# Patient Record
Sex: Male | Born: 1960 | Race: White | Hispanic: No | Marital: Married | Smoking: Former smoker
Health system: Southern US, Community
[De-identification: ages and names within clinical notes are randomized; demographics above are authoritative.]

## PROBLEM LIST (undated history)

## (undated) DIAGNOSIS — Z87442 Personal history of urinary calculi: Secondary | ICD-10-CM

## (undated) DIAGNOSIS — F429 Obsessive-compulsive disorder, unspecified: Secondary | ICD-10-CM

## (undated) DIAGNOSIS — Z86718 Personal history of other venous thrombosis and embolism: Secondary | ICD-10-CM

## (undated) DIAGNOSIS — I10 Essential (primary) hypertension: Secondary | ICD-10-CM

## (undated) DIAGNOSIS — M1990 Unspecified osteoarthritis, unspecified site: Secondary | ICD-10-CM

## (undated) DIAGNOSIS — F909 Attention-deficit hyperactivity disorder, unspecified type: Secondary | ICD-10-CM

## (undated) HISTORY — DX: Essential (primary) hypertension: I10

## (undated) HISTORY — PX: HERNIA REPAIR: SHX51

## (undated) HISTORY — DX: Attention-deficit hyperactivity disorder, unspecified type: F90.9

## (undated) HISTORY — PX: LITHOTRIPSY: SUR834

## (undated) HISTORY — PX: COLONOSCOPY: SHX174

## (undated) HISTORY — PX: TONSILLECTOMY: SUR1361

## (undated) HISTORY — DX: Obsessive-compulsive disorder, unspecified: F42.9

---

## 2004-04-15 ENCOUNTER — Encounter: Payer: Self-pay | Admitting: Internal Medicine

## 2005-03-24 ENCOUNTER — Ambulatory Visit: Payer: Self-pay | Admitting: Internal Medicine

## 2006-04-06 ENCOUNTER — Ambulatory Visit: Payer: Self-pay | Admitting: Internal Medicine

## 2007-02-21 ENCOUNTER — Ambulatory Visit: Payer: Self-pay | Admitting: Internal Medicine

## 2007-02-21 ENCOUNTER — Ambulatory Visit: Payer: Self-pay | Admitting: Cardiology

## 2007-02-21 LAB — CONVERTED CEMR LAB
Creatinine, Ser: 1 mg/dL (ref 0.4–1.5)
Uric Acid, Serum: 8 mg/dL — ABNORMAL HIGH (ref 2.4–7.0)

## 2007-03-04 ENCOUNTER — Emergency Department (HOSPITAL_COMMUNITY): Admission: EM | Admit: 2007-03-04 | Discharge: 2007-03-04 | Payer: Self-pay | Admitting: Emergency Medicine

## 2007-03-23 ENCOUNTER — Encounter: Admission: RE | Admit: 2007-03-23 | Discharge: 2007-03-23 | Payer: Self-pay | Admitting: Internal Medicine

## 2007-04-19 ENCOUNTER — Ambulatory Visit: Payer: Self-pay | Admitting: Internal Medicine

## 2007-04-19 LAB — CONVERTED CEMR LAB
Magnesium: 2.1 mg/dL (ref 1.5–2.5)
Uric Acid, Serum: 7.4 mg/dL — ABNORMAL HIGH (ref 2.4–7.0)

## 2007-06-16 ENCOUNTER — Telehealth (INDEPENDENT_AMBULATORY_CARE_PROVIDER_SITE_OTHER): Payer: Self-pay | Admitting: *Deleted

## 2007-09-08 ENCOUNTER — Encounter: Payer: Self-pay | Admitting: Internal Medicine

## 2007-10-14 ENCOUNTER — Encounter: Payer: Self-pay | Admitting: Internal Medicine

## 2007-10-14 ENCOUNTER — Ambulatory Visit: Payer: Self-pay | Admitting: Unknown Physician Specialty

## 2007-10-28 ENCOUNTER — Ambulatory Visit: Payer: Self-pay | Admitting: Urology

## 2007-11-18 ENCOUNTER — Telehealth (INDEPENDENT_AMBULATORY_CARE_PROVIDER_SITE_OTHER): Payer: Self-pay | Admitting: *Deleted

## 2007-11-21 DIAGNOSIS — E785 Hyperlipidemia, unspecified: Secondary | ICD-10-CM

## 2007-11-21 DIAGNOSIS — I4949 Other premature depolarization: Secondary | ICD-10-CM

## 2007-11-21 DIAGNOSIS — Z8719 Personal history of other diseases of the digestive system: Secondary | ICD-10-CM

## 2007-11-21 DIAGNOSIS — I803 Phlebitis and thrombophlebitis of lower extremities, unspecified: Secondary | ICD-10-CM

## 2007-11-21 DIAGNOSIS — F429 Obsessive-compulsive disorder, unspecified: Secondary | ICD-10-CM | POA: Insufficient documentation

## 2007-12-12 ENCOUNTER — Telehealth (INDEPENDENT_AMBULATORY_CARE_PROVIDER_SITE_OTHER): Payer: Self-pay | Admitting: *Deleted

## 2007-12-16 ENCOUNTER — Ambulatory Visit: Payer: Self-pay | Admitting: Internal Medicine

## 2007-12-18 LAB — CONVERTED CEMR LAB
ALT: 37 units/L (ref 0–53)
AST: 31 units/L (ref 0–37)
Albumin: 4 g/dL (ref 3.5–5.2)
Alkaline Phosphatase: 49 units/L (ref 39–117)
Bilirubin, Direct: 0.1 mg/dL (ref 0.0–0.3)
Cholesterol: 202 mg/dL (ref 0–200)
Direct LDL: 132.8 mg/dL
HDL: 43.1 mg/dL (ref >39.0)
Total Bilirubin: 1 mg/dL (ref 0.3–1.2)
Total CHOL/HDL Ratio: 4.7
Total Protein: 6.6 g/dL (ref 6.0–8.3)
Triglycerides: 137 mg/dL (ref 0–149)
VLDL: 27 mg/dL (ref 0–40)

## 2007-12-19 ENCOUNTER — Encounter (INDEPENDENT_AMBULATORY_CARE_PROVIDER_SITE_OTHER): Payer: Self-pay | Admitting: *Deleted

## 2007-12-30 ENCOUNTER — Ambulatory Visit: Payer: Self-pay | Admitting: Internal Medicine

## 2007-12-30 ENCOUNTER — Telehealth: Payer: Self-pay | Admitting: Internal Medicine

## 2007-12-30 DIAGNOSIS — E782 Mixed hyperlipidemia: Secondary | ICD-10-CM | POA: Insufficient documentation

## 2007-12-30 LAB — CONVERTED CEMR LAB
Cholesterol, target level: 200 mg/dL
HDL goal, serum: 40 mg/dL
LDL Goal: 160 mg/dL

## 2008-02-18 IMAGING — CT CT HEAD W/O CM
1 series · 16 of 30 positions shown, 20 images · IV contrast (agent unspecified)
Comparison: none

CLINICAL DATA: Acute onset severe right-sided headache.  
HEAD CT WITHOUT CONTRAST:
TECHNIQUE: Contiguous axial images were obtained from the base of the skull through the vertex according to standard protocol without contrast.

[Series 2: head_seq 4.5 h37s st · axial · 0.47mm/px · z∈[-147,-3]mm · 16 of 36 slices shown, 20 images]
[im 2/36  brain]
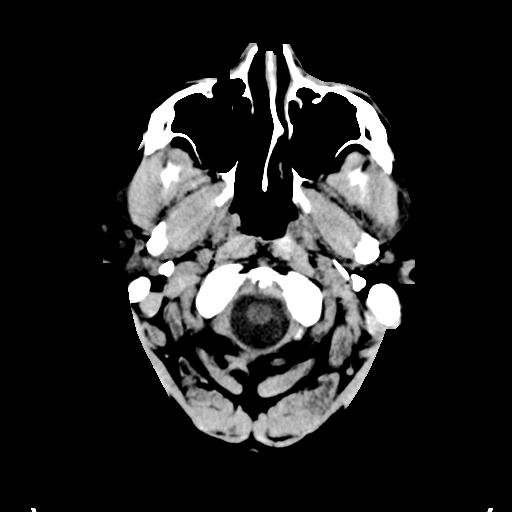
[im 2/36  bone]
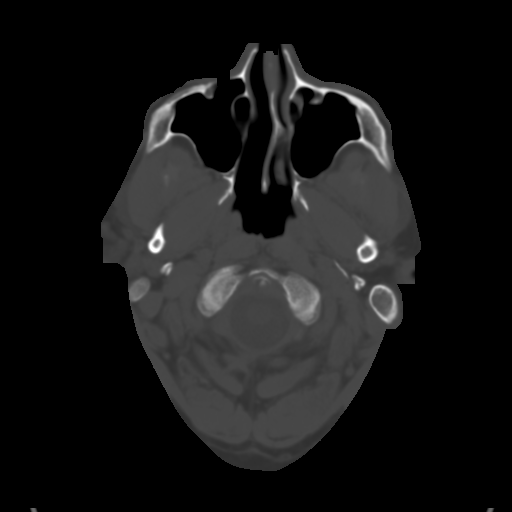
[im 4/36  brain]
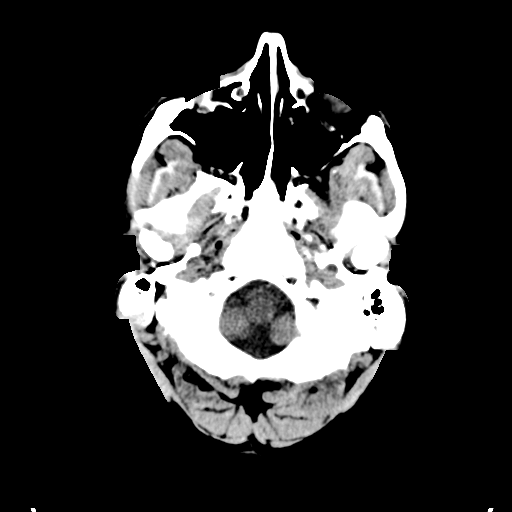
[im 7/36  brain]
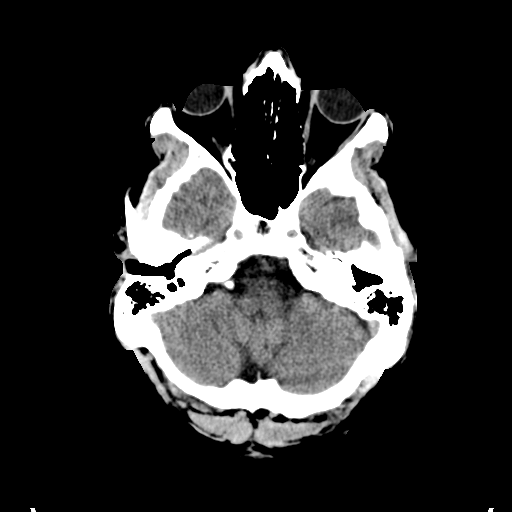
[im 9/36  brain]
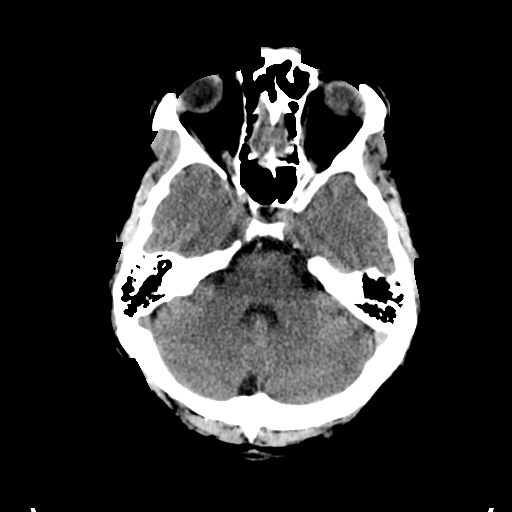
[im 10/36  brain]
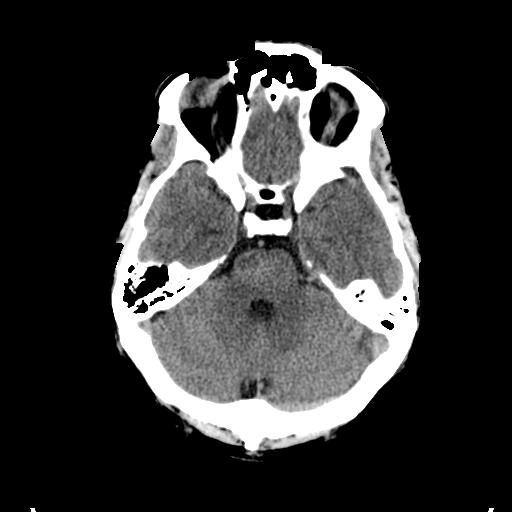
[im 10/36  bone]
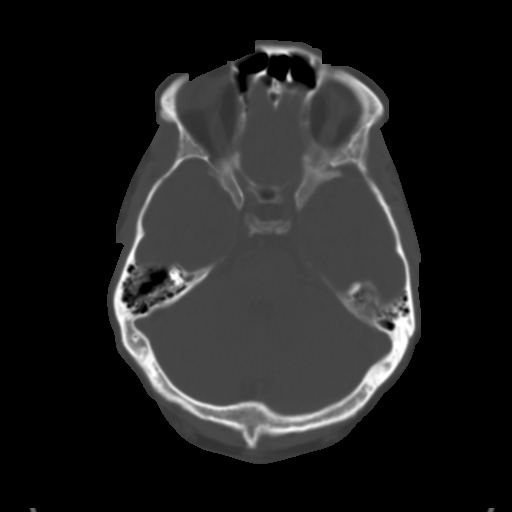
[im 13/36  brain]
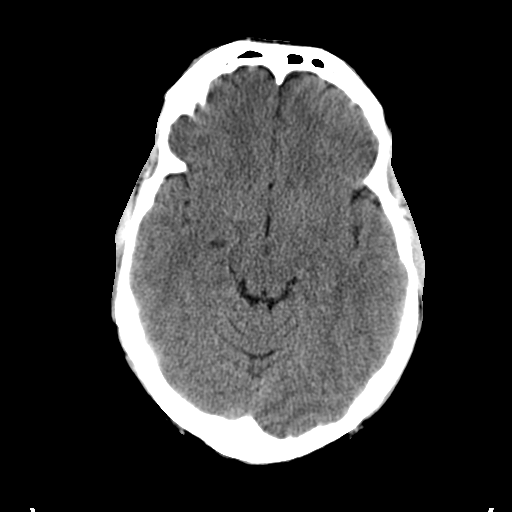
[im 15/36  brain]
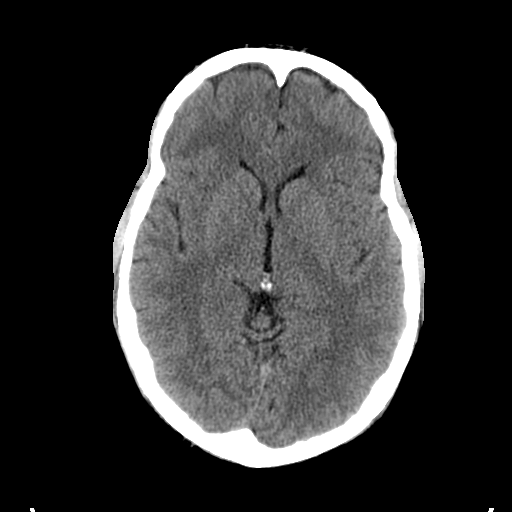
[im 17/36  brain]
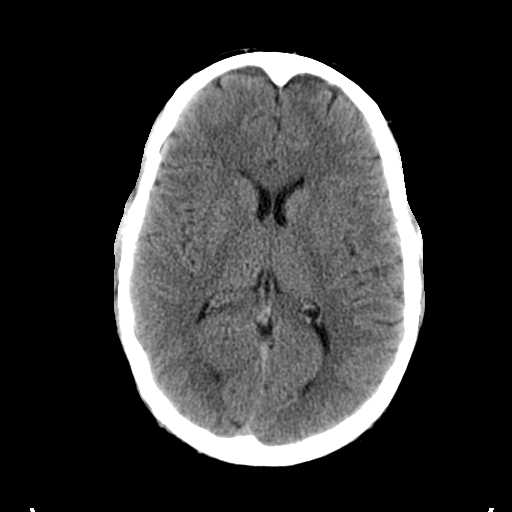
[im 19/36  brain]
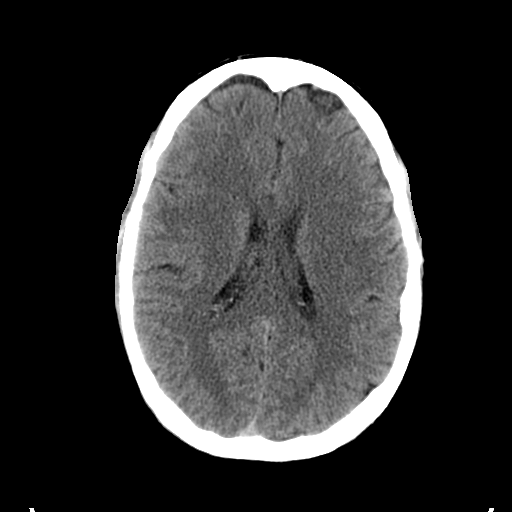
[im 19/36  bone]
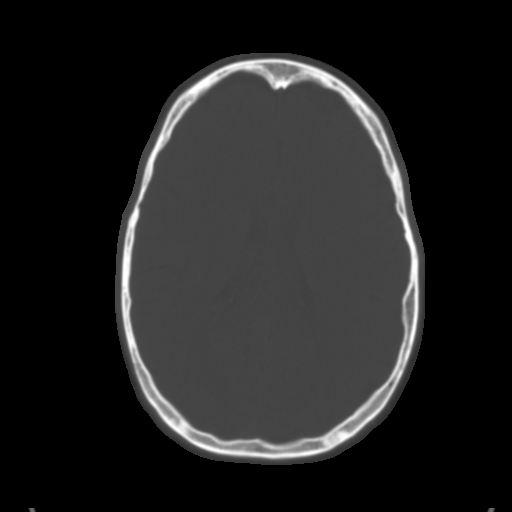
[im 21/36  brain]
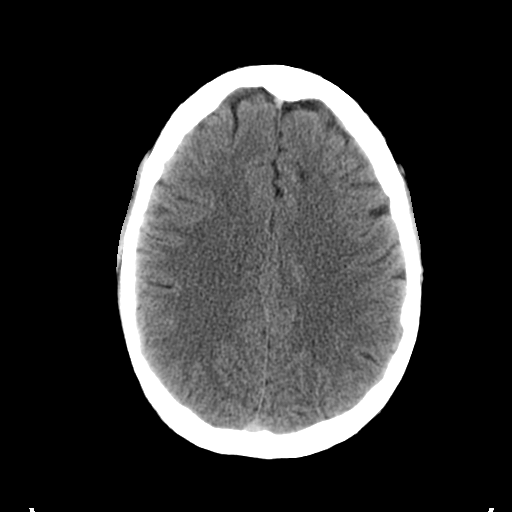
[im 23/36  brain]
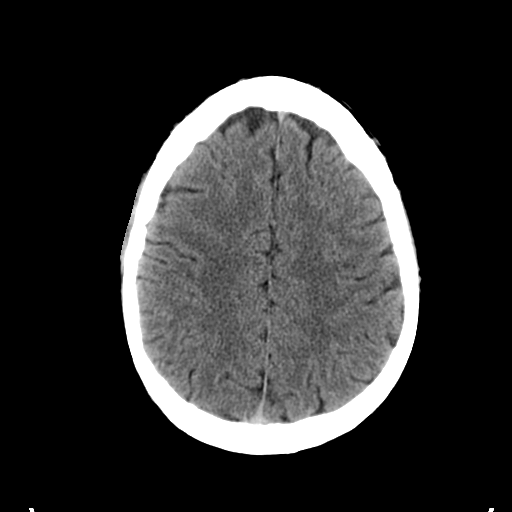
[im 26/36  brain]
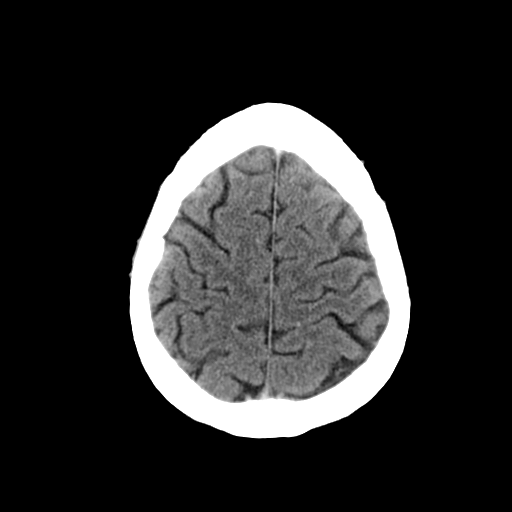
[im 27/36  brain]
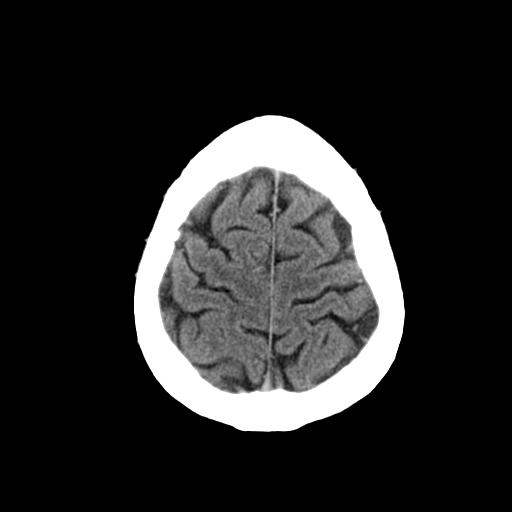
[im 27/36  bone]
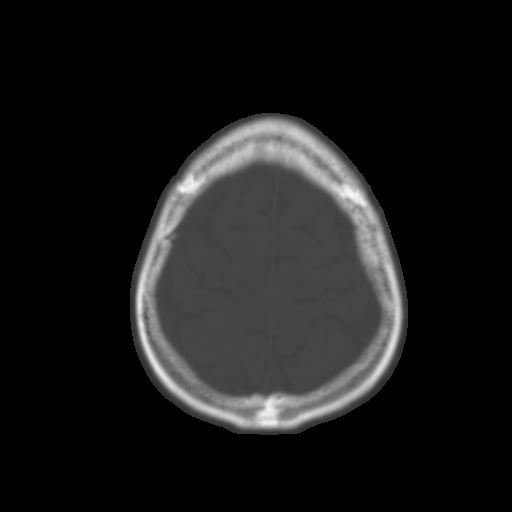
[im 29/36  brain]
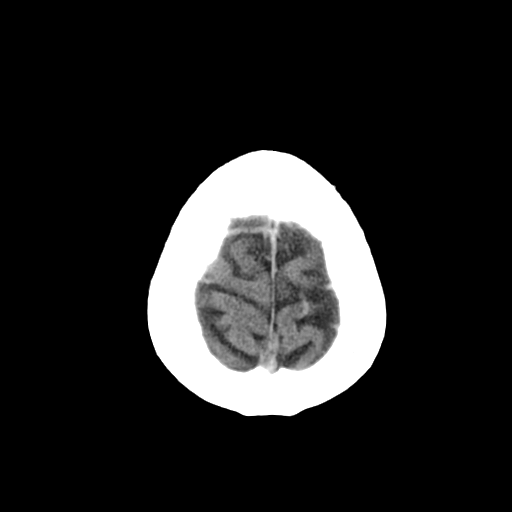
[im 32/36  brain]
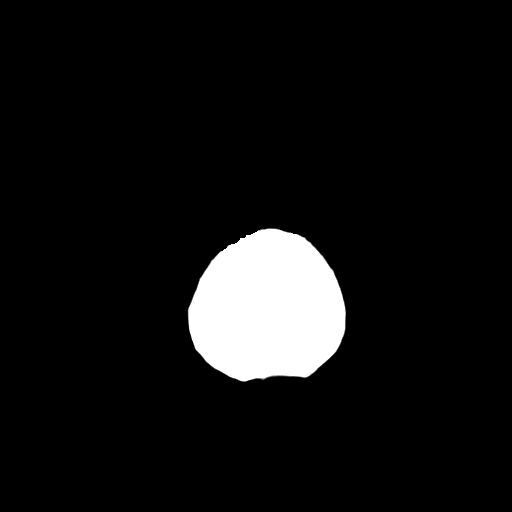
[im 34/36  brain]
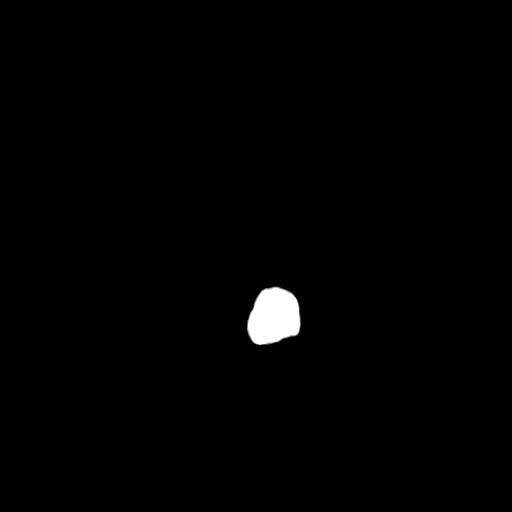

[16 of 30 positions shown; findings below may reference images not displayed]

FINDINGS: There is no evidence of intracranial hemorrhage, brain edema, acute infarct, mass lesion, or mass effect.  No other intra-axial abnormalities are seen, and the ventricles are within normal limits.  No abnormal 
extra-axial fluid collections or masses are identified.  No skull 
abnormalities are noted.
IMPRESSION: Negative non-contrast head CT.

## 2008-03-22 ENCOUNTER — Ambulatory Visit: Payer: Self-pay | Admitting: Internal Medicine

## 2008-03-22 LAB — CONVERTED CEMR LAB
ALT: 41 units/L (ref 0–53)
AST: 33 units/L (ref 0–37)
Albumin: 4.2 g/dL (ref 3.5–5.2)
Alkaline Phosphatase: 52 units/L (ref 39–117)
Bilirubin, Direct: 0.1 mg/dL (ref 0.0–0.3)
Cholesterol: 151 mg/dL (ref 0–200)
HDL: 38.3 mg/dL — ABNORMAL LOW (ref >39.0)
LDL Cholesterol: 87 mg/dL (ref 0–99)
Total Bilirubin: 1.2 mg/dL (ref 0.3–1.2)
Total CHOL/HDL Ratio: 3.9
Total Protein: 6.9 g/dL (ref 6.0–8.3)
Triglycerides: 130 mg/dL (ref 0–149)
VLDL: 26 mg/dL (ref 0–40)

## 2008-03-29 ENCOUNTER — Ambulatory Visit: Payer: Self-pay | Admitting: Internal Medicine

## 2008-03-29 DIAGNOSIS — I10 Essential (primary) hypertension: Secondary | ICD-10-CM

## 2008-03-29 LAB — CONVERTED CEMR LAB: LDL Goal: 130 mg/dL

## 2008-04-24 ENCOUNTER — Telehealth: Payer: Self-pay | Admitting: Internal Medicine

## 2008-04-26 ENCOUNTER — Telehealth (INDEPENDENT_AMBULATORY_CARE_PROVIDER_SITE_OTHER): Payer: Self-pay | Admitting: *Deleted

## 2008-05-15 ENCOUNTER — Encounter: Payer: Self-pay | Admitting: Internal Medicine

## 2008-05-16 ENCOUNTER — Telehealth (INDEPENDENT_AMBULATORY_CARE_PROVIDER_SITE_OTHER): Payer: Self-pay | Admitting: *Deleted

## 2008-05-17 ENCOUNTER — Ambulatory Visit: Payer: Self-pay | Admitting: Urology

## 2008-05-17 ENCOUNTER — Telehealth (INDEPENDENT_AMBULATORY_CARE_PROVIDER_SITE_OTHER): Payer: Self-pay | Admitting: *Deleted

## 2008-07-20 ENCOUNTER — Telehealth (INDEPENDENT_AMBULATORY_CARE_PROVIDER_SITE_OTHER): Payer: Self-pay | Admitting: *Deleted

## 2008-07-26 ENCOUNTER — Ambulatory Visit: Payer: Self-pay | Admitting: Internal Medicine

## 2008-08-07 ENCOUNTER — Ambulatory Visit: Payer: Self-pay | Admitting: Internal Medicine

## 2008-09-03 ENCOUNTER — Ambulatory Visit: Payer: Self-pay | Admitting: Urology

## 2008-09-04 ENCOUNTER — Ambulatory Visit: Payer: Self-pay | Admitting: Urology

## 2008-09-06 ENCOUNTER — Ambulatory Visit: Payer: Self-pay | Admitting: Urology

## 2008-09-10 ENCOUNTER — Ambulatory Visit: Payer: Self-pay | Admitting: Urology

## 2008-09-13 ENCOUNTER — Ambulatory Visit: Payer: Self-pay | Admitting: Urology

## 2008-09-20 ENCOUNTER — Ambulatory Visit: Payer: Self-pay | Admitting: Urology

## 2008-10-11 ENCOUNTER — Telehealth (INDEPENDENT_AMBULATORY_CARE_PROVIDER_SITE_OTHER): Payer: Self-pay | Admitting: *Deleted

## 2008-10-29 ENCOUNTER — Ambulatory Visit: Payer: Self-pay | Admitting: Urology

## 2008-11-19 ENCOUNTER — Ambulatory Visit: Payer: Self-pay | Admitting: Urology

## 2008-11-29 ENCOUNTER — Ambulatory Visit: Payer: Self-pay | Admitting: Urology

## 2008-12-17 ENCOUNTER — Ambulatory Visit: Payer: Self-pay | Admitting: Urology

## 2009-01-07 ENCOUNTER — Telehealth (INDEPENDENT_AMBULATORY_CARE_PROVIDER_SITE_OTHER): Payer: Self-pay | Admitting: *Deleted

## 2009-04-04 ENCOUNTER — Telehealth (INDEPENDENT_AMBULATORY_CARE_PROVIDER_SITE_OTHER): Payer: Self-pay | Admitting: *Deleted

## 2009-04-11 ENCOUNTER — Telehealth (INDEPENDENT_AMBULATORY_CARE_PROVIDER_SITE_OTHER): Payer: Self-pay | Admitting: *Deleted

## 2009-04-18 ENCOUNTER — Ambulatory Visit: Payer: Self-pay | Admitting: Internal Medicine

## 2009-04-18 DIAGNOSIS — F988 Other specified behavioral and emotional disorders with onset usually occurring in childhood and adolescence: Secondary | ICD-10-CM | POA: Insufficient documentation

## 2009-04-18 DIAGNOSIS — Z87442 Personal history of urinary calculi: Secondary | ICD-10-CM

## 2009-04-19 ENCOUNTER — Encounter (INDEPENDENT_AMBULATORY_CARE_PROVIDER_SITE_OTHER): Payer: Self-pay | Admitting: *Deleted

## 2009-06-07 ENCOUNTER — Ambulatory Visit: Payer: Self-pay | Admitting: Urology

## 2009-06-07 ENCOUNTER — Encounter: Payer: Self-pay | Admitting: Internal Medicine

## 2009-10-24 ENCOUNTER — Ambulatory Visit: Payer: Self-pay | Admitting: Internal Medicine

## 2010-04-08 ENCOUNTER — Encounter: Payer: Self-pay | Admitting: Internal Medicine

## 2010-04-10 ENCOUNTER — Ambulatory Visit: Payer: Self-pay | Admitting: Surgery

## 2010-04-10 HISTORY — PX: INGUINAL HERNIA REPAIR: SHX194

## 2010-04-17 ENCOUNTER — Ambulatory Visit: Payer: Self-pay | Admitting: Surgery

## 2010-04-23 ENCOUNTER — Ambulatory Visit: Payer: Self-pay | Admitting: Internal Medicine

## 2010-04-23 DIAGNOSIS — E559 Vitamin D deficiency, unspecified: Secondary | ICD-10-CM | POA: Insufficient documentation

## 2010-04-23 DIAGNOSIS — N400 Enlarged prostate without lower urinary tract symptoms: Secondary | ICD-10-CM

## 2010-05-08 ENCOUNTER — Telehealth (INDEPENDENT_AMBULATORY_CARE_PROVIDER_SITE_OTHER): Payer: Self-pay | Admitting: *Deleted

## 2010-09-04 ENCOUNTER — Ambulatory Visit: Payer: Self-pay | Admitting: Internal Medicine

## 2010-09-05 ENCOUNTER — Encounter: Payer: Self-pay | Admitting: Internal Medicine

## 2010-09-16 ENCOUNTER — Telehealth (INDEPENDENT_AMBULATORY_CARE_PROVIDER_SITE_OTHER): Payer: Self-pay | Admitting: *Deleted

## 2010-09-23 ENCOUNTER — Ambulatory Visit: Payer: Self-pay | Admitting: Internal Medicine

## 2010-10-03 ENCOUNTER — Encounter: Payer: Self-pay | Admitting: Internal Medicine

## 2010-10-03 ENCOUNTER — Ambulatory Visit: Payer: Self-pay | Admitting: Unknown Physician Specialty

## 2010-10-30 ENCOUNTER — Ambulatory Visit: Payer: Self-pay | Admitting: Urology

## 2010-10-30 ENCOUNTER — Encounter: Payer: Self-pay | Admitting: Internal Medicine

## 2010-11-20 ENCOUNTER — Ambulatory Visit: Payer: Self-pay | Admitting: Internal Medicine

## 2010-11-20 DIAGNOSIS — M545 Low back pain: Secondary | ICD-10-CM

## 2010-12-01 ENCOUNTER — Other Ambulatory Visit: Payer: Self-pay

## 2011-01-04 ENCOUNTER — Encounter: Payer: Self-pay | Admitting: Internal Medicine

## 2011-01-11 ENCOUNTER — Ambulatory Visit: Payer: Self-pay | Admitting: Internal Medicine

## 2011-01-11 LAB — CONVERTED CEMR LAB
ALT: 37 units/L (ref 0–53)
AST: 28 units/L (ref 0–37)
Albumin: 4.5 g/dL (ref 3.5–5.2)
Alkaline Phosphatase: 67 units/L (ref 39–117)
BUN: 17 mg/dL (ref 6–23)
Basophils Absolute: 0 10*3/uL (ref 0.0–0.1)
Basophils Relative: 0.3 % (ref 0.0–3.0)
Bilirubin, Direct: 0.1 mg/dL (ref 0.0–0.3)
CO2: 29 meq/L (ref 19–32)
Calcium: 9.4 mg/dL (ref 8.4–10.5)
Chloride: 105 meq/L (ref 96–112)
Cholesterol: 150 mg/dL (ref 0–200)
Creatinine, Ser: 1 mg/dL (ref 0.4–1.5)
Eosinophils Absolute: 0.1 10*3/uL (ref 0.0–0.7)
Eosinophils Relative: 2.4 % (ref 0.0–5.0)
GFR calc non Af Amer: 84.68 mL/min (ref >60)
Glucose, Bld: 85 mg/dL (ref 70–99)
HCT: 46.9 % (ref 39.0–52.0)
HDL: 44.8 mg/dL (ref >39.00)
Hemoglobin: 16.5 g/dL (ref 13.0–17.0)
LDL Cholesterol: 87 mg/dL (ref 0–99)
Lymphocytes Relative: 36.5 % (ref 12.0–46.0)
Lymphs Abs: 2.2 10*3/uL (ref 0.7–4.0)
MCHC: 35.3 g/dL (ref 30.0–36.0)
MCV: 91.2 fL (ref 78.0–100.0)
Monocytes Absolute: 0.4 10*3/uL (ref 0.1–1.0)
Monocytes Relative: 6 % (ref 3.0–12.0)
Neutro Abs: 3.4 10*3/uL (ref 1.4–7.7)
Neutrophils Relative %: 54.8 % (ref 43.0–77.0)
PSA: 0.97 ng/mL (ref 0.10–4.00)
Platelets: 167 10*3/uL (ref 150.0–400.0)
Potassium: 3.6 meq/L (ref 3.5–5.1)
RBC: 5.14 M/uL (ref 4.22–5.81)
RDW: 11.7 % (ref 11.5–14.6)
Sodium: 140 meq/L (ref 135–145)
TSH: 0.81 microintl units/mL (ref 0.35–5.50)
Total Bilirubin: 1 mg/dL (ref 0.3–1.2)
Total CHOL/HDL Ratio: 3
Total Protein: 7.2 g/dL (ref 6.0–8.3)
Triglycerides: 90 mg/dL (ref 0.0–149.0)
VLDL: 18 mg/dL (ref 0.0–40.0)
WBC: 6.1 10*3/uL (ref 4.5–10.5)

## 2011-01-13 NOTE — Assessment & Plan Note (Signed)
Summary: FOR BACK PAIN//PH   Vital Signs:  Patient profile:   50 year old male Weight:      186.2 pounds BMI:     25.34 Temp:     98.8 degrees F oral Pulse rate:   72 / minute Resp:     14 per minute BP sitting:   128 / 86  (left arm) Cuff size:   large  Vitals Entered By: Shonna Chock CMA (November 20, 2010 3:02 PM) CC: Back pain: Pulled muscle on 11/07/2010   CC:  Back pain: Pulled muscle on 11/07/2010.  History of Present Illness: Back Pain      This is a 50 year old man who presents with Back pain  since 11/25.  The pain is located in the left low back.  The pain began gradually and after lifting  a 30#  vise.  The pain radiates to the  L   inguinal area. The pain is made better by  flexion  and NSAID medications. The latter excaerbated colitic symptoms. The  patient denies loss of sensation, fecal incontinence, urinary incontinence, and urinary retention.  Chiropractry X 3  helped tmporarily only.  Current Medications (verified): 1)  Asacol 400 Mg  Tbec (Mesalamine) .... Take 3 Tablets Two Times A Day By Mouth 2)  Vyvanse 60 Mg Caps (Lisdexamfetamine Dimesylate) .Marland Kitchen.. 1 By Mouth Once Daily 3)  Niaspan 1000 Mg  Tbcr (Niacin (Antihyperlipidemic)) .Marland Kitchen.. 1 Q Eve As Directed 4)  Sertraline Hcl 50 Mg  Tabs (Sertraline Hcl) .... 1/2 Tab Qam and 2 in The Evening 5)  Bayer Aspirin 325 Mg  Tabs (Aspirin) .Marland Kitchen.. 1 By Mouth Once Daily  Allergies: 1)  ! Phenergan 2)  ! Aleve (Naproxen Sodium)  Physical Exam  General:  well-nourished,in no acute distress; alert,appropriate and cooperative throughout examination Abdomen:  Bowel sounds positive,abdomen soft and non-tender without masses, organomegaly or hernias noted. Msk:  He lay down & sat up w/o help Extremities:  Normal full range of motion of  LE to 90 degrees. Minor DIP changes Neurologic:  alert & oriented X3, strength normal in all extremities,  heel / toe gait normal, and DTRs symmetrical and normal.   Skin:  Intact without  suspicious lesions or rashes   Impression & Recommendations:  Problem # 1:  LOW BACK PAIN SYNDROME (ICD-724.2) @ L1 level His updated medication list for this problem includes:    Bayer Aspirin 325 Mg Tabs (Aspirin) .Marland Kitchen... 1 by mouth once daily    Carisoprodol 350 Mg Tabs (Carisoprodol) .Marland Kitchen... 1 at bedtime as needed    Tramadol Hcl 50 Mg Tabs (Tramadol hcl)    Celebrex 200 Mg Caps (Celecoxib) .Marland Kitchen... 1 two times a day as needed  Complete Medication List: 1)  Asacol 400 Mg Tbec (Mesalamine) .... Take 3 tablets two times a day by mouth 2)  Vyvanse 60 Mg Caps (Lisdexamfetamine dimesylate) .Marland Kitchen.. 1 by mouth once daily 3)  Niaspan 1000 Mg Tbcr (Niacin (antihyperlipidemic)) .Marland Kitchen.. 1 q eve as directed 4)  Sertraline Hcl 50 Mg Tabs (Sertraline hcl) .... 1/2 tab qam and 2 in the evening 5)  Bayer Aspirin 325 Mg Tabs (Aspirin) .Marland Kitchen.. 1 by mouth once daily 6)  Carisoprodol 350 Mg Tabs (Carisoprodol) .Marland Kitchen.. 1 at bedtime as needed 7)  Tramadol Hcl 50 Mg Tabs (Tramadol hcl) 8)  Celebrex 200 Mg Caps (Celecoxib) .Marland Kitchen.. 1 two times a day as needed  Patient Instructions: 1)  Stretching exercises as discussed. Prescriptions: CELEBREX 200 MG CAPS (CELECOXIB)  1 two times a day as needed  #12 x 0   Entered and Authorized by:   Marga Melnick MD   Signed by:   Marga Melnick MD on 11/20/2010   Method used:   Samples Given   RxID:   [id number] CARISOPRODOL 350 MG TABS (CARISOPRODOL) 1 at bedtime as needed  #21 x 0   Entered and Authorized by:   Marga Melnick MD   Signed by:   Marga Melnick MD on 11/20/2010   Method used:   Faxed to ...       [facility] [address], [facility] (retail)       [address]       [place], [place]  [address]       Ph: [phone]       Fax: [phone]   RxID:   [id number]    Orders Added: 1)  Est. Patient Level III [[address]]

## 2011-01-13 NOTE — Assessment & Plan Note (Signed)
Summary: cpx/cbs   Vital Signs:  Patient profile:   50 year old male Height:      72 inches Weight:      182.4 pounds BMI:     24.83 Temp:     99.1 degrees F oral Pulse rate:   85 / minute Resp:     16 per minute BP sitting:   152 / 90  (left arm) Cuff size:   large  Vitals Entered By: Shonna Chock (Apr 23, 2010 1:20 PM)  Comments REVIEWED MED LIST, PATIENT AGREED DOSE AND INSTRUCTION CORRECT    History of Present Illness: Nicholas Bonilla is here for a physical; he is asymptomatic. He had inguinal herniorrhaphy bilaterally 2 weeks ago.Labs 04/08/2010 from Dr Elliott's office reviewed:all WNL except vitamin D level was 21.5. In 2005  his LDL was 140 with 2269 total & 1350 small dense particles & TG 242.LDL now 75 & TG 70. He has lost > 65# with TLC.  Hypertension History:      He denies headache, chest pain, palpitations, dyspnea with exertion, orthopnea, PND, peripheral edema, visual symptoms, neurologic problems, syncope, and side effects from treatment.  BP has been 120/75 on average based on MD appt readings over past 2 months.  Further comments include: Repeat BP was 130/78.        Positive major cardiovascular risk factors include male age 42 years old or older, hyperlipidemia, hypertension, and family history for ischemic heart disease (males less than 13 years old).  Negative major cardiovascular risk factors include no history of diabetes and non-tobacco-user status.        Further assessment for target organ damage reveals no history of ASHD, stroke/TIA, or peripheral vascular disease.     Allergies: 1)  ! 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, Dr Andee Poles history of colitis Nephrolithiasis, hx of X 4 Hyperlipidemia  Past Surgical History: Tonsillectomy Colitis-hospitalization x 1 Colonscopy 2009  negative, Dr Ma Hillock ; Lithotripsy X2, Dr Lonna Cobb, [place] Inguinal herniorrhaphy bilaterally 04-15-2010  Family History: Father:died of  MI  @ age 74, ?HTN, rheumatic fever Mother: negative Siblings: sister cancer larynx, smoker 2 Maternal uncle:  heart disease, death in 50-60s  Social History: Former Smoker :quit  cigarettes  1986 after 10 yrs Alcohol use-yes rarely Occupation:Sales Associate Married Regular exercise-yes: walking 15-30 min 3X/week w/o symptoms  Review of Systems General:  Denies fatigue and sleep disorder. Eyes:  Denies blurring, double vision, and vision loss-both eyes. ENT:  Denies difficulty swallowing and hoarseness. CV:  Denies leg cramps with exertion. Resp:  Denies cough, shortness of breath, sputum productive, and wheezing. GI:  Denies abdominal pain, bloody stools, change in bowel habits, dark tarry stools, and indigestion; Colonoscopy scheduled 09/2010. GU:  Denies discharge, dysuria, and hematuria. MS:  Denies joint pain, joint redness, joint swelling, low back pain, mid back pain, and thoracic pain. Derm:  Denies changes in nail beds, dryness, hair loss, and rash. Neuro:  Denies numbness, tingling, and weakness. Psych:  Denies anxiety, depression, easily angered, easily tearful, and irritability. Endo:  Denies cold intolerance, excessive hunger, excessive thirst, excessive urination, and heat intolerance. Heme:  Denies abnormal bruising and bleeding. Allergy:  Denies itching eyes and sneezing.  Physical Exam  General:  Thin but well-nourished; alert,appropriate and cooperative throughout examination Head:  Normocephalic and atraumatic without obvious abnormalities. Pattern  alopecia ; moustache Eyes:  No corneal or conjunctival inflammation noted. Perrla. Funduscopic exam benign, without hemorrhages, exudates or papilledema Ears:  External ear exam shows no significant lesions or deformities.  Otoscopic examination reveals clear  canals, tympanic membranes are intact bilaterally without bulging, retraction, inflammation or discharge. Hearing is grossly normal bilaterally. Nose:  External nasal examination shows no deformity or inflammation. Nasal mucosa are pink and moist without lesions or exudates. Mouth:  Oral mucosa and oropharynx without lesions or exudates.  Teeth in good repair. Neck:  No deformities, masses, or tenderness noted. Lungs:  Normal respiratory effort, chest expands symmetrically. Lungs are clear to auscultation, no crackles or wheezes. Heart:  Normal rate and regular rhythm. S1 and S2 normal without gallop, murmur, click, rub .S4 Abdomen:  Bowel sounds positive,abdomen soft and non-tender without masses, organomegaly or hernias noted. Lap op scars well healed Rectal:  No external abnormalities noted. Normal sphincter tone. No rectal masses or tenderness. Genitalia:  Deferred, S/P herniorrhaphy Prostate:  no nodules, no asymmetry, no induration, and 1+ enlarged.   Msk:  No deformity or scoliosis noted of thoracic or lumbar spine.   Pulses:  R and L carotid,radial,dorsalis pedis and posterior tibial pulses are full and equal bilaterally Extremities:  No clubbing, cyanosis, edema, or deformity noted with normal full range of motion of all joints.   Neurologic:  alert & oriented X3 and DTRs symmetrical and normal.   Skin:  Intact without suspicious lesions or rashes Cervical Nodes:  No lymphadenopathy noted Axillary Nodes:  No palpable lymphadenopathy Inguinal Nodes:  No significant adenopathy Psych:  memory intact for recent and remote, normally interactive, good eye contact, not anxious appearing, and not depressed appearing.     Impression & Recommendations:  Problem # 1:  ROUTINE GENERAL MEDICAL EXAM@HEALTH  CARE FACL (ICD-V70.0)  Orders: EKG w/ Interpretation (93000)  Problem # 2:  HYPERTENSION, ESSENTIAL NOS (ICD-401.9)  controlled His updated medication list for this problem includes:     Diltiazem Hcl 120 Mg Tabs (Diltiazem hcl) .Marland Kitchen... 1/2 two times a day if bp averages > 130/85  Orders: EKG w/ Interpretation (93000)  Problem # 3:  HYPERLIPIDEMIA (ICD-272.2) Lipids @ goal The following medications were removed from the medication list:    Crestor 5 Mg Tabs (Rosuvastatin calcium) .Marland Kitchen... 1 qd His updated medication list for this problem includes:    Niaspan 1000 Mg Tbcr (Niacin (antihyperlipidemic)) .Marland Kitchen... 1 q eve as directed  Problem # 4:  HYPERPLASIA PROSTATE UNS W/O UR OBST & OTH LUTS (ICD-600.90)  Problem # 5:  Hx of COLITIS, HX OF (ICD-V12.79) as per Dr Mechele Collin  Problem # 6:  VITAMIN D DEFICIENCY (ICD-268.9)  Complete Medication List: 1)  Asacol 400 Mg Tbec (Mesalamine) .... Take 3 tablets two times a day by mouth 2)  Vyvanse 60 Mg Caps (Lisdexamfetamine dimesylate) .Marland Kitchen.. 1 by mouth once daily 3)  Niaspan 1000 Mg Tbcr (Niacin (antihyperlipidemic)) .Marland Kitchen.. 1 q eve as directed 4)  Sertraline Hcl 50 Mg Tabs (Sertraline hcl) .... 1/2 tab qam and 1 1/2 in the evening 5)  Bayer Aspirin 325 Mg Tabs (Aspirin) .Marland Kitchen.. 1 by mouth once daily 6)  Diltiazem Hcl 120 Mg Tabs (Diltiazem hcl) .... 1/2 two times a day if bp averages > 130/85 7)  Decongestant  .... Prescribed by ent as needed  Hypertension Assessment/Plan:      The patient's hypertensive risk group is category B: At least one risk factor (excluding diabetes) with no target organ damage.  His calculated 10 year risk of coronary heart  disease is 6 %.  Today's blood pressure is 152/90.     Patient Instructions: 1)  Check your Blood Pressure regularly. If it is above: 135/85 ON AVERAGE you should make an appointment. Vitamin D3 1000 International Units once daily . Stop Crestor. Please schedule a follow-up fasting lab  appointment in 4 months for vitamin D level (268.9) & NMR Lipoprofile Lipid Panel (272.4,V17.3).Take an Aspirin every day. Prescriptions: NIASPAN 1000 MG  TBCR (NIACIN (ANTIHYPERLIPIDEMIC)) 1 q eve as directed   #90 x 3   Entered and Authorized by:   Marga Melnick MD   Signed by:   Marga Melnick MD on 04/23/2010   Method used:   Faxed to ...       [facility] (retail)       [address]       [place], [place]  [address]       Ph: [phone]       Fax: [phone]   RxID:   [id number]

## 2011-01-13 NOTE — Progress Notes (Signed)
Summary: appt to discuss nmr  Phone Note Outgoing Call   Call placed by: Doristine Devoid CMA,  September 16, 2010 3:56 PM Call placed to: Patient Summary of Call: left message on machine patient will need to schedule appt w/ [facility] to discuss nmr  Follow-up for Phone Call        pt aware,appt scheduled...........Marland KitchenFelecia Deloach CMA  September 16, 2010 4:01 PM

## 2011-01-13 NOTE — Letter (Signed)
Summary: Cancer Screening/[facility] Personalized Risk Profile  Cancer Screening/[facility] Personalized Risk Profile   Imported By: Lanelle Bal 04/28/2010 12:47:18  _____________________________________________________________________  External Attachment:    Type:   Image     Comment:   External Document

## 2011-01-13 NOTE — Procedures (Signed)
Summary: Colonoscopy/[facility]  [facility]   Imported By: Lanelle Bal 10/28/2010 09:21:44  _____________________________________________________________________  External Attachment:    Type:   Image     Comment:   External Document

## 2011-01-13 NOTE — Assessment & Plan Note (Signed)
Summary: TO DISCUSS LABS///SPH   Vital Signs:  Patient profile:   50 year old male Weight:      183.8 pounds BMI:     25.02 Pulse rate:   76 / minute Resp:     12 per minute BP sitting:   122 / 80  (left arm) Cuff size:   large  Vitals Entered By: Shonna Chock CMA (September 23, 2010 4:17 PM) CC: Follow-up visit: Discuss Labs (patient with mailed copy), Lipid Management   CC:  Follow-up visit: Discuss Labs (patient with mailed copy) and Lipid Management.  History of Present Illness: Hyperlipidemia Follow-Up      This is a 50 year old man who presents for Hyperlipidemia follow-up.  The patient reports flushing, but denies muscle aches, GI upset, abdominal pain, itching, constipation, diarrhea, and fatigue.  The patient denies the following symptoms: chest pain/pressure, exercise intolerance, dypsnea, palpitations, syncope, and pedal edema.  Compliance with medications (by patient report) has been near 100%.  Dietary compliance has been good.  The patient reports exercising 2-3X/ week.  Adjunctive measures currently used by the patient include ASA and niacin.   NMR Lipoprofile reviewed:LDL goal = < 120.  Lipid Management History:      Positive NCEP/ATP III risk factors include male age 67 years old or older, family history for ischemic heart disease (males less than 47 years old), and hypertension.  Negative NCEP/ATP III risk factors include non-diabetic, non-tobacco-user status, no ASHD (atherosclerotic heart disease), no prior stroke/TIA, no peripheral vascular disease, and no history of aortic aneurysm.     Current Medications (verified): 1)  Asacol 400 Mg  Tbec (Mesalamine) .... Take 3 Tablets Two Times A Day By Mouth 2)  Vyvanse 60 Mg Caps (Lisdexamfetamine Dimesylate) .Marland Kitchen.. 1 By Mouth Once Daily 3)  Niaspan 1000 Mg  Tbcr (Niacin (Antihyperlipidemic)) .Marland Kitchen.. 1 Q Eve As Directed 4)  Sertraline Hcl 50 Mg  Tabs (Sertraline Hcl) .... 1/2 Tab Qam and 1 1/2 in The Evening 5)  Bayer Aspirin  325 Mg  Tabs (Aspirin) .Marland Kitchen.. 1 By Mouth Once Daily  Allergies: 1)  ! 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 NMR Lipoprofile panel 2011: LDL 143(1680/593), HDL 66, TG 80.LDL goal = < 120.  Family History: Father:died of  MI  @ age 80, ?HTN, rheumatic fever Mother: negative Siblings: sister cancer larynx, smoker 2 Maternal uncles:  heart disease, death in 50-60s  Physical Exam  General:  well-nourished; alert,appropriate and cooperative throughout examination Heart:  Normal rate and regular rhythm. S1 and S2 normal without gallop, murmur, click, rub.S4 Pulses:  R and L carotid,radial,dorsalis pedis and posterior tibial pulses are full and equal bilaterally Psych:  Oriented X3.  Focused & intelligent   Impression & Recommendations:  Problem # 1:  HYPERLIPIDEMIA (ICD-272.2)  His updated medication list for this problem includes:    Niaspan 1000 Mg Tbcr (Niacin (antihyperlipidemic)) .Marland Kitchen... 1 q eve as directed  Complete Medication List: 1)  Asacol 400 Mg Tbec (Mesalamine) .... Take 3 tablets two times a day by mouth 2)  Vyvanse 60 Mg Caps (Lisdexamfetamine dimesylate) .Marland Kitchen.. 1 by mouth once daily 3)  Niaspan 1000 Mg Tbcr (Niacin (antihyperlipidemic)) .Marland Kitchen.. 1 q eve as directed 4)  Sertraline Hcl 50 Mg Tabs (Sertraline  hcl) .... 1/2 tab qam and 1 1/2 in the evening 5)  Bayer Aspirin 325 Mg Tabs (Aspirin) .Marland Kitchen.. 1 by mouth once daily  Lipid Assessment/Plan:      Based on NCEP/ATP III, the patient's risk factor category is "2 or more risk factors and a calculated 10 year CAD risk of < 20%".  The patient's lipid goals are as follows: Total cholesterol goal is 200; LDL cholesterol goal is 130; HDL cholesterol goal is  40; Triglyceride goal is 150.  His LDL cholesterol goal has been met.  Secondary causes for hyperlipidemia have been ruled out.  He has been counseled on adjunctive measures for lowering his cholesterol and has been provided with dietary instructions.    Patient Instructions: 1)  TLC X 6 months , then check a fasting [place] Heart Panel (1304 X)

## 2011-01-13 NOTE — Letter (Signed)
Summary: [facility]  [facility]   Imported By: Lanelle Bal 11/10/2010 13:52:21  _____________________________________________________________________  External Attachment:    Type:   Image     Comment:   External Document

## 2011-01-13 NOTE — Progress Notes (Signed)
  Phone Note Other Incoming   Request: Send information Summary of Call: [facility] medical release received from the patient requesting for copies of records from 2000-2003. Request forwarded to [employer].

## 2011-01-13 NOTE — Progress Notes (Signed)
Summary: NIASPAN--SHOULD HE GET REFILL  Phone Note Refill Request Call back at Home Phone [phone] Message from:  Patient on September 16, 2010 4:04 PM  Refills Requested: Medication #1:  NIASPAN 1000 MG  TBCR 1 q eve as directed BASED ON LAB RESULTS, SHOULD HE GET THIS REFILLED NOW??  PLEASE CALL HIM TO LET HIM KNOW    [facility]  Initial call taken by: Jerolyn Shin,  September 16, 2010 4:05 PM  Follow-up for Phone Call        see OV dated 04-23-10 rx was sent in to pharmacy #[id number]...............Marland KitchenFelecia Deloach CMA  September 16, 2010 4:14 PM   left pt detial message of the above in reference to refill.............Marland KitchenFelecia Deloach CMA  September 17, 2010 8:13 AM

## 2011-01-13 NOTE — Letter (Signed)
Summary: [facility]  [facility]   Imported By: Lanelle Bal 04/28/2010 13:27:38  _____________________________________________________________________  External Attachment:    Type:   Image     Comment:   External Document

## 2011-02-24 ENCOUNTER — Telehealth (INDEPENDENT_AMBULATORY_CARE_PROVIDER_SITE_OTHER): Payer: Self-pay | Admitting: *Deleted

## 2011-02-26 ENCOUNTER — Other Ambulatory Visit: Payer: Self-pay | Admitting: Unknown Physician Specialty

## 2011-03-03 NOTE — Progress Notes (Signed)
Summary: d/c'd niaspan  Phone Note Call from Patient Call back at Home Phone [phone]   Caller: Patient Summary of Call: Pt called says that he believes niaspan is causing flare up of colitis and stopped medication on sunday along w/ aspirin. Just wanted to know if labs should be done sooner has pending appointment 03/2011 Initial call taken by: Doristine Devoid CMA,  February 24, 2011 10:05 AM  Follow-up for Phone Call        spoke w/ patient informed that no need to come in any earlier for appt since his appt is in a few weeks     New/Updated Medications: NIASPAN 1000 MG  TBCR (NIACIN (ANTIHYPERLIPIDEMIC)) stopped  Appended Document: d/c'd niaspan Office Message from Date: 02/23/2011 12:00:00 AM Time of Call: [phone] Faxed To: [facility] [place] [facility] Fax Number: [phone] Facility: home Patient: Nicholas, Bonilla DOB: [birth year] Phone: [phone] Provider: Marga Melnick Message: Needs a call back concerning his medications. Regarding Appointment: Appt Date: Appt Time: Unknown Provider: Reason: Details: Outcome: Message Taken by: Delrae Rend, CSR [facility]  [address] [place], [place] [address]  P: [phone]  F: [phone]

## 2011-03-18 ENCOUNTER — Other Ambulatory Visit: Payer: PRIVATE HEALTH INSURANCE

## 2011-03-25 ENCOUNTER — Other Ambulatory Visit: Payer: Self-pay

## 2011-03-26 ENCOUNTER — Other Ambulatory Visit: Payer: Self-pay

## 2011-03-30 ENCOUNTER — Ambulatory Visit: Payer: Self-pay | Admitting: Internal Medicine

## 2011-04-09 ENCOUNTER — Encounter: Payer: Self-pay | Admitting: Internal Medicine

## 2011-04-21 ENCOUNTER — Other Ambulatory Visit: Payer: Self-pay | Admitting: Unknown Physician Specialty

## 2011-05-01 ENCOUNTER — Ambulatory Visit: Payer: Self-pay | Admitting: Unknown Physician Specialty

## 2011-05-01 NOTE — Assessment & Plan Note (Signed)
Nicholas Bonilla                        [facility] OFFICE NOTE   Nicholas Bonilla, Nicholas Bonilla                      MRN:          [id number]  DATE:04/19/2007                            DOB:          04/07/1961    Nicholas Bonilla was seen for a comprehensive physical examination Apr 19, 2007.   He had recently been evaluated for postcoital headaches with a CAT scan  and MRIs which were negative.  Subsequently he saw an otolaryngologist  who found no pathology.  The symptoms have not reoccurred.   He has been evaluated by Dr. Shelva Majestic recently with a stress echo which  was negative.  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   He has had extensive labs performed which were at goal except for the  lipid profile.  His LDL was 131, triglycerides 172.  This is on Advicor  1000/20.  He is on no specific diet.  He is exercising 20 minutes  several times a week.  He does have exertional dyspnea going up an  incline.   PAST MEDICAL HISTORY:  Is otherwise unchanged.  He has had  tonsillectomy.  He was hospitalized for colitis on 1 occasion.  He had  phlebitis in the left lower extremity in 1996 with no definite triggers.   His father had a heart attack at 65,  hypertension and rheumatic fever.  Maternal uncle had heart disease.  A sister had cancer of the larynx;  she was a smoker.   He has not smoked since 1986.  He drinks minimally.   He has no known drug allergies.   Presently he is on:  1. Asacol 400 mg 3 twice a day.  2. Testosterone supplementation, by Dr. Jamelle Rushing, a urologist in      [place].  Dr. Jamelle Rushing monitors the digital rectal exam and PSA.  3. He is on multiple  supplements  4. Diovan 80 mg daily.  5. Concerta  36 mg daily.  6. Oral chelation formula.  7. Nasonex a needed.   REVIEW OF SYSTEMS:  Reveals some congestion which tends to be seasonal;  as stated he is on Nasonex as needed. He does have a nasal lavage  system.   He will awaken in  the mornings with calf pain, but has no other myalgias  or arthralgias.   The remainder of the review of systems is negative.   He is on no specific diet but is reducing sugars.   REVIEW OF THE CHART:  Indicates that he has dropped his triglycerides  significantly.  Additionally, he has been found to have an elevated  homocysteine level; he is not on vitamin E.   Weight is down approximately 2 pounds at 227.4.  Pulse is 56.  Respiratory rate is 12 and blood pressure 110/74.  He has a Oceanographer.  There are minimal vascular changes on fundal exam.  Dental  hygiene is  excellent.  There is mild erythema of the nares.  The ENT exam is  otherwise negative.  Thyroid was normal to palpation.  He has no lymphadenopathy at the neck  or axilla.  S4 is present without murmurs.  All pulses are intact.  He has no lymphadenopathy of the neck or axilla.  He has no abdominal tenderness, no masses.  GENITOURINARY:  Exam is deferred to Dr. Jamelle Rushing.  MUSCULOSKELETAL:  Exam is unremarkable as is the neuropsychiatric  evaluation.   LABS:  Performed by Dr. Shelva Majestic were reviewed.  Will draw  a vit D  ,magnesium to evaluate muscle  pain.   I would recommend that [name] follow the Flat Belly Diet as found at  [employer]vention.[employer], which is a low carb, heart healthy diet.  Cardiovascular  exercises should be increased to 30 to 45 minutes 3 or 4 times a week.  The Advicor was to be changed to Simcor titrating dose to 1000/20; but  he requested diet & exercise intervention & staying on Advicor.His LDL  goal is at least less than 100, if not less than 75 ideally.     Nicholas Bonilla. Alwyn Ren, MD,FACP,FCCP  Electronically Signed    [facility]  DD: 04/19/2007  DT: 04/19/2007  Job #: [id number]   cc:   Nicholas Bonilla. Shelva Majestic, M.D.

## 2011-05-01 NOTE — Assessment & Plan Note (Signed)
[facility]                        [place] OFFICE NOTE   ONDRE, SALVETTI                      MRN:          [id number]  DATE:02/21/2007                            DOB:          1961/09/07    Gunnar Fusi. Jaber was seen February 21, 2007, for followup of headache.   The evening of March 7, approximately 6 p.m. while engaging in  intercourse, he noted a painful shot in his head.  He had a dull  headache which persisted over the right temple and crown through March  8.  The symptoms resolved after that.  He has a residual tightness in  his shoulders and also as noted a vague symptom with movement of the  head in that same right temple area.   He denies any ophthalmologic symptoms.  He has had no constitutional  symptoms.  He denies weakness, tremor, gait dysfunction, or numbness or  tingling.   Past history includes tonsillectomy, hospitalization for colitis and  phlebitis in the left lower extremity.  Also had dyslipidemia with  decreased HDL.  He does have an elevated homocysteine level.   His family history includes myocardial infarction, hypertension,  rheumatic heart disease in his father, and heart disease in a maternal  uncle.  Sister had cancer of the larynx.   He quit smoking in 1986.  He drinks minimally.   Presently, he is on:  1. Asacol.  2. Testosterone replacement.  3. Aspirin 325.  4. Celexa 40.  5. Diovan 80.  6. Concerta 36.  7. Advicor 1000/20.  8. Oral chelation substance.   He has no known drug allergies.   Weight was up 9 pounds to 229, pulse 64, respiratory rate 16, and blood  pressure 122/66.  Pupils equal, round, and reactive to light.  Visual  fields are normal.  Extraocular motion is intact.   Cranial nerve exam normal, as are reflexes.   There were no carotid bruits.  Deep tendon reflexes are normal.   He has an S4 with no murmurs.  Carotids reveal no bruits.   Neurologic exam reveals normal gait  and station.  He has normal strength  and sensation.  Rapid alternating movement, alliteration, and Romberg  were all negative.   Because he describes this as one of the worst headaches that he has ever  had, and because of the medications he is taking including Concerta, I  will recommend imaging despite the negative neurologic and  cardiovascular exam.     Titus Dubin. Alwyn Ren, MD,FACP,FCCP  Electronically Signed    [facility]  DD: 02/21/2007  DT: 02/21/2007  Job #: [id number]

## 2011-05-06 ENCOUNTER — Ambulatory Visit (INDEPENDENT_AMBULATORY_CARE_PROVIDER_SITE_OTHER): Payer: PRIVATE HEALTH INSURANCE | Admitting: Internal Medicine

## 2011-05-06 ENCOUNTER — Encounter: Payer: Self-pay | Admitting: Internal Medicine

## 2011-05-06 DIAGNOSIS — Z8249 Family history of ischemic heart disease and other diseases of the circulatory system: Secondary | ICD-10-CM

## 2011-05-06 DIAGNOSIS — E782 Mixed hyperlipidemia: Secondary | ICD-10-CM

## 2011-05-06 DIAGNOSIS — I10 Essential (primary) hypertension: Secondary | ICD-10-CM

## 2011-05-06 MED ORDER — METOPROLOL TARTRATE 25 MG PO TABS: 25.0000 mg | ORAL_TABLET | Freq: Two times a day (BID) | ORAL | Status: DC

## 2011-05-06 MED ORDER — SIMVASTATIN 20 MG PO TABS: 20.0000 mg | ORAL_TABLET | Freq: Every evening | ORAL | Status: DC

## 2011-05-06 NOTE — Assessment & Plan Note (Addendum)
NMR Lipoprofile 2005: LDL 140(2269/1350),TG 242. LDL goal =< 100. Father MI @ 75

## 2011-05-06 NOTE — Patient Instructions (Signed)
If you do start the simvastatin 20 mg at bedtime; please have a fasting in NMR lipoprotein  & fasting AST/ALT after 10 weeks of therapy while still on the medicine.9272.4, 995.2)

## 2011-05-06 NOTE — Progress Notes (Signed)
  Subjective:    Patient ID: Nicholas Bonilla, male    DOB: 12-22-1960, 50 y.o.   MRN: [id number]  HPI #1  HYPERTENSION exacerbation after  Colonoscopy 05/18 ; reading was 164/106. He is concerned Prednisone 40 mg daily x 4 weeks played a role. Off BP meds  for > 18 months. Disease Monitoring  Blood pressure range: since 5/18 average 150/90  Chest pain: no   Dyspnea: no   Claudication: no  Medication compliance: yes,   Medication Side Effects  Lightheadedness: no   Urinary frequency: no   Edema: no  Preventitive Healthcare:  Exercise: no, due to colitis   Diet Pattern: no specific diet  Salt Restriction: yes   #2Dyslipidemia assessment:[place] Heart Panel  Lab results  reviewed : his risk relates to LDL > 100 .   Prior Advanced Lipid Testing: NMR Lipoprofile 2005.   Family history of premature CAD/ MI: father @ 89 .  Diabetes : A1c 5.3 % .  Smoking history  : quit 1990 .     Weight :   Up  20# with steroids. ROS: fatigue: no ;  palpitations: no; abd pain/bowel changes: diarrhea & small stools with colitis ; myalgias:no;  syncope : no ; memory loss: no;skin changes: acne from steroids.   Review of Systems he's had swelling of the MCP joints and right hand related to recurrent trauma. He was questioning possibly wearing a  brace of some protective gear     Objective:   Physical Exam General appearance is one of good health and nourishment. Skull is normocephalic without lymphadenopathy about the head, neck, or axilla. See current vital signs (BP 155/88) Heart:  Normal rate and regular rhythm. S1 and S2 normal without gallop, murmur, click, rub or other extra sounds.Lungs:Chest clear to auscultation; no wheezes, rhonchi,rales ,or rubs present.No increased work of breathing.  Abdomen: bowel sounds normal, soft and non-tender without masses, organomegaly or hernias noted.  No guarding or rebound  All pulses intact without  bruits .No ischemic skin changes.  There is mild resolving ecchymosis and  slight fusiform changes the MCP joint of the third finger. Range of motion is normal.       Assessment & Plan:  #1 hypertension uncontrolled, probably multifactorial related to pain with colitis and medications used to treat it  #2 dyslipidemia in the context of premature coronary artery disease in his father, MI 62  #3 post trauma joint changes  Plan: #1 metoprolol 25 mg twice a day with blood pressure monitor  #2 a statin would be recommended to treat the significant elevation of the LDL. His T/T  genotype is normal for statin  metabolization  #3 Zostrix cream twice a day as needed. He will  have to make a concerted effort to prevent recurrent trauma to this area.

## 2011-05-12 LAB — PATHOLOGY REPORT

## 2011-06-22 ENCOUNTER — Encounter: Payer: Self-pay | Admitting: Internal Medicine

## 2011-06-22 ENCOUNTER — Ambulatory Visit (INDEPENDENT_AMBULATORY_CARE_PROVIDER_SITE_OTHER): Payer: PRIVATE HEALTH INSURANCE | Admitting: Internal Medicine

## 2011-06-22 VITALS — BP 156/100 | HR 85 | Wt 214.8 lb

## 2011-06-22 DIAGNOSIS — I809 Phlebitis and thrombophlebitis of unspecified site: Secondary | ICD-10-CM

## 2011-06-22 DIAGNOSIS — E782 Mixed hyperlipidemia: Secondary | ICD-10-CM

## 2011-06-22 DIAGNOSIS — I10 Essential (primary) hypertension: Secondary | ICD-10-CM

## 2011-06-22 DIAGNOSIS — I803 Phlebitis and thrombophlebitis of lower extremities, unspecified: Secondary | ICD-10-CM

## 2011-06-22 DIAGNOSIS — M25519 Pain in unspecified shoulder: Secondary | ICD-10-CM | POA: Insufficient documentation

## 2011-06-22 NOTE — Progress Notes (Signed)
  Subjective:    Patient ID: Nicholas Bonilla, male    DOB: 01-17-1961, 50 y.o.   MRN: [id number]  HPI Here with several issues 3 weeks ago developed a swelling/warmness/redness/ tenderness and aching at inner aspect of the left calf, area of pain was 1x15 cm; overall it is improving. 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 In June he did some remodeling at home and developed a right shoulder pain, located at the a.c. joint. Worse with certain arm movements, worse at night. I notice his BP was elevated, he took metoprolol as prescribed by his primary doctor a few weeks ago but the medication made him "grogy" so he quit taking it Finally, he has developed acne mostly in the back and mid chest, he thinks it was related to the steroids prescribed for ulcerative colitis. He is now off steroids and the acne is going away.  PMH-- reviewed  PSH--reviewed    Review of Systems No recent fever or chills No recent  airplane trip, chest pain or shortness of breath He was prescribed cholesterol medication but decided not to take it for now. No pain of the left shoulder.     Objective:   Physical Exam  Constitutional: He is oriented to person, place, and time. He appears well-developed and well-nourished. No distress.  Musculoskeletal:       Calves themselves are not tender to palpation but the left calf is around 1.5 cm larger than the right. This is a chronic finding according to the patient ever since he had the DVT in the 90s. Shoulders symmetric, range of motion asymmetric as well. No deformities appear lightly tender to palpation at the a.c. joint.   Neurological: He is alert and oriented to person, place, and time.  Skin: He is not diaphoretic.       He has a palpable cord in the inner aspect of the left calf, area is slightly tender, about 1x12 cm. Skin is slightly red and warm. According to the patient is better than before.  Psychiatric: He has a normal  mood and affect. His behavior is normal. Judgment and thought content normal.          Assessment & Plan:

## 2011-06-22 NOTE — Assessment & Plan Note (Signed)
Refer to sports medicine, he may benefit from further workup versus a local injection

## 2011-06-22 NOTE — Assessment & Plan Note (Signed)
Blood pressure elevated today, see history of present illness, reportedly intolerant to metoprolol. Trial with bystolic. See instructions.

## 2011-06-22 NOTE — Assessment & Plan Note (Addendum)
Seems to have an  acute phlebitis in the left leg, symptoms are self resolving. Recommend a warm compress, increase aspirin 325 mg one twice a day for few days. Give a history of DVT, will check  A ultrasound

## 2011-06-22 NOTE — Assessment & Plan Note (Signed)
Has not started statins, recommend to discuss with PCP

## 2011-06-22 NOTE — Patient Instructions (Signed)
Please see Hopp in 2 weeks to see if bystolic 5mg  1 tableta day is working for you Call if side effects

## 2011-06-23 ENCOUNTER — Other Ambulatory Visit: Payer: Self-pay | Admitting: Internal Medicine

## 2011-06-23 ENCOUNTER — Ambulatory Visit (INDEPENDENT_AMBULATORY_CARE_PROVIDER_SITE_OTHER): Payer: PRIVATE HEALTH INSURANCE | Admitting: *Deleted

## 2011-06-23 ENCOUNTER — Telehealth: Payer: Self-pay | Admitting: Internal Medicine

## 2011-06-23 DIAGNOSIS — I8000 Phlebitis and thrombophlebitis of superficial vessels of unspecified lower extremity: Secondary | ICD-10-CM

## 2011-06-23 DIAGNOSIS — I8310 Varicose veins of unspecified lower extremity with inflammation: Secondary | ICD-10-CM

## 2011-06-23 DIAGNOSIS — I809 Phlebitis and thrombophlebitis of unspecified site: Secondary | ICD-10-CM

## 2011-06-23 NOTE — Telephone Encounter (Signed)
Phone call from vascular lab, no DVT in Nicholas left leg, does have superficial phlebitis. I talked to Nicholas Bonilla, plan  is Nicholas same

## 2011-06-24 ENCOUNTER — Ambulatory Visit: Payer: PRIVATE HEALTH INSURANCE | Admitting: Internal Medicine

## 2011-06-25 ENCOUNTER — Encounter: Payer: Self-pay | Admitting: Internal Medicine

## 2011-06-29 ENCOUNTER — Encounter: Payer: Self-pay | Admitting: Family Medicine

## 2011-06-29 ENCOUNTER — Ambulatory Visit (INDEPENDENT_AMBULATORY_CARE_PROVIDER_SITE_OTHER): Payer: PRIVATE HEALTH INSURANCE | Admitting: Family Medicine

## 2011-06-29 VITALS — BP 161/96 | HR 73

## 2011-06-29 DIAGNOSIS — M25519 Pain in unspecified shoulder: Secondary | ICD-10-CM

## 2011-06-29 NOTE — Progress Notes (Signed)
Subjective:    Patient ID: Nicholas Bonilla, male    DOB: 12/24/1960, 50 y.o.   MRN: [id number]  PCP: Dr. Alwyn Ren  HPI 50 yo M here for right shoulder pain.  Patient denies known injury. States he was working a lot around the house 3-4 weeks ago when anterior portion of right shoulder started hurting. No swelling or bruising. Pain has not been waking him up at night. Taking tylenol with some benefit. No prior right shoulder injuries or surgeries. No numbness or tingling. Not icing or using heat. Does radiate down upper arm. No neck pain. L handed  Past Medical History  Diagnosis Date  . Ulcerative colitis   . ADD (attention deficit disorder with hyperactivity)   . OCD (obsessive compulsive disorder)   . Hypertension     Current Outpatient Prescriptions on File Prior to Visit  Medication Sig Dispense Refill  . aspirin 325 MG tablet Take 325 mg by mouth daily.        . carisoprodol (SOMA) 350 MG tablet Take 350 mg by mouth 4 (four) times daily as needed.        . Cholecalciferol (VITAMIN D) 2000 UNITS CAPS Take by mouth daily.        . fish oil-omega-3 fatty acids 1000 MG capsule Take 1 g by mouth 2 (two) times daily.        Marland Kitchen lisdexamfetamine (VYVANSE) 60 MG capsule Take 60 mg by mouth every morning.        . Mesalamine (ASACOL HD) 800 MG TBEC Take by mouth. 2 by mouth three times daily       . nebivolol (BYSTOLIC) 5 MG tablet Take 5 mg by mouth daily.        . Probiotic Product (PROBIOTIC PO) Take by mouth. 2 x daily       . sertraline (ZOLOFT) 50 MG tablet Take 50 mg by mouth as directed. 2 by mouth daily      . simvastatin (ZOCOR) 20 MG tablet Take 1 tablet (20 mg total) by mouth every evening.  90 tablet  0    Past Surgical History  Procedure Date  . Tonsillectomy   . Colonoscopy 2009    Neg  . Lithotripsy     x2  . Inguinal hernia repair 04/10/2010    Bilateral    Allergies  Allergen Reactions  . Naproxen Sodium     REACTION: colitis flare  . Promethazine Hcl      REACTION: mental status changes    History   Social History  . Marital Status: Married    Spouse Name: N/A    Number of Children: N/A  . Years of Education: N/A   Occupational History  . Not on file.   Social History Main Topics  . Smoking status: Former Smoker    Types: Cigarettes    Quit date: 12/14/1984  . Smokeless tobacco: Not on file  . Alcohol Use: Yes  . Drug Use: Not on file  . Sexually Active: Not on file   Other Topics Concern  . Not on file   Social History Narrative  . No narrative on file    Family History  Problem Relation Age of Onset  . Heart attack Father 75  . Hypertension Father   . Rheumatic fever Father   . Sudden death Father   . Cancer Sister     Larynx-smoker  . Hypertension Sister   . Heart disease Maternal Uncle     2 M-Uncles  .  Heart attack Paternal Uncle   . Heart attack Paternal Grandfather   . Diabetes Neg Hx   . Hyperlipidemia Neg Hx     BP 161/96  Pulse 73  Review of Systems See HPI above.    Objective:   Physical Exam Gen: NAD R shoulder: No gross deformity, swelling, bruising. TTP biceps tendon.  No AC or other TTP about shoulder. FROM with negative painful arc. Strength 5/5 with empty can, resisted IR and ER.  Minimal pain with empty can and resisted ER Negative hawkins and neers Negative speeds and yergasons Negative crossover. Negative apprehension.     Assessment & Plan:  1. Right shoulder pain - 2/2 biceps tendinopathy with very mild rotator cuff tendinopathy.  Start PT, continue tylenol.  We discussed nsaids but given his h/o ulcerative colitis and that his pain from tendinopathy is mild, would not start these.  Ice, avoid painful activities.  Will reassess in 6 weeks to check on his progression.

## 2011-06-29 NOTE — Assessment & Plan Note (Signed)
2/2 biceps tendinopathy with very mild rotator cuff tendinopathy.  Start PT, continue tylenol.  We discussed nsaids but given his h/o ulcerative colitis and that his pain from tendinopathy is mild, would not start these.  Ice, avoid painful activities.  Will reassess in 6 weeks to check on his progression.

## 2011-06-29 NOTE — Patient Instructions (Signed)
Your exam and history are most consistent with biceps tendinitis (with very mild rotator cuff tendinopathy as well). Physical therapy and home exercises are the most important part of your treatment. Avoid painful activities except with home exercise program or physical therapy Ice area 15 minutes at a time 3-4 times a day Take tylenol as needed. Surgery is an option if you do not improve with conservative care but is rarely needed. Follow up with me in 6 weeks or as needed if you are better.

## 2011-07-06 ENCOUNTER — Ambulatory Visit (INDEPENDENT_AMBULATORY_CARE_PROVIDER_SITE_OTHER): Payer: PRIVATE HEALTH INSURANCE | Admitting: Internal Medicine

## 2011-07-06 DIAGNOSIS — I10 Essential (primary) hypertension: Secondary | ICD-10-CM

## 2011-07-06 DIAGNOSIS — I803 Phlebitis and thrombophlebitis of lower extremities, unspecified: Secondary | ICD-10-CM

## 2011-07-06 MED ORDER — AMOXICILLIN 500 MG PO CAPS: 500.0000 mg | ORAL_CAPSULE | Freq: Three times a day (TID) | ORAL | Status: AC

## 2011-07-06 MED ORDER — CARVEDILOL 12.5 MG PO TABS: 12.5000 mg | ORAL_TABLET | Freq: Two times a day (BID) | ORAL | Status: DC

## 2011-07-06 NOTE — Progress Notes (Signed)
  Subjective:    Patient ID: Nicholas Bonilla, male    DOB: Oct 24, 1961, 50 y.o.   MRN: [id number]  HPI HYPERTENSION Disease Monitoring  Blood pressure range: not monitored regularly  Chest pain: no   Dyspnea: no   Claudication: no   Medication compliance: yes  Medication Side Effects  Lightheadedness: no   Urinary frequency: no   Edema: no  Preventitive Healthcare:  Exercise: no, due to recent phlebitis   Diet Pattern: Weight Watchers'  Salt Restriction: no       Review of Systems the phlebitis has essentially resolved. He denies any significant soreness in the calf now. He specifically denies pleuritic  chest pain or shortness of breath. He also denies fever, or chills. PMH DVT 1994; S/P coumadin for 6 months.     Objective:   Physical Exam Gen.: Healthy and well-nourished in appearance. Alert, appropriate and cooperative throughout exam. Lungs: Normal respiratory effort; chest expands symmetrically. Lungs are clear to auscultation without rales, wheezes, or increased work of breathing. Heart: Normal rate and rhythm. Normal S1 and S2. No gallop, click, or rub. S4 w/o  murmur.                                                                                    Musculoskeletal/extremities: No clubbing, cyanosis, edema, or deformity noted.  Vascular: Carotid, radial artery, dorsalis pedis and  posterior tibial pulses are full and equal. No bruits present.Homan's negative. Neurologic: Alert and oriented x3. Deep tendon reflexes symmetrical and normal.          Skin: Intact without suspicious lesions or rashes. There is a palpable cord along the medial aspect of the right calf. He states it is not tender. There is very faint hyperpigmentation present in this area as well. Psych: Mood and affect are normal. Normally interactive                                                                                         Assessment & Plan:  #1 hypertension, adequate control  #2  superficial phlebitis, improved. I feel is a mild residual component of phlebitis present.  Plan: #1 warm compresses to this area 3 times a day if possible. Continue to wear the support  hose  #2 antibiotic course.

## 2011-07-06 NOTE — Patient Instructions (Signed)
Blood Pressure Goal  Ideally is an AVERAGE < 135/85. This AVERAGE should be calculated from @ least 5-7 BP readings taken @ different times of day on different days of week. You should not respond to isolated BP readings , but rather the AVERAGE for that week  

## 2011-08-18 ENCOUNTER — Telehealth: Payer: Self-pay | Admitting: *Deleted

## 2011-08-18 NOTE — Telephone Encounter (Signed)
Pt c/o depression & fatigue with Carvedilol and has stopped medication. Pt feels like he had better results w/o side effects with Bystolic and is requesting a new Rx.

## 2011-08-18 NOTE — Telephone Encounter (Signed)
Bystolic 5 mg daily #30, Rx5.Blood Pressure Goal  Ideally is an AVERAGE < 135/85. This AVERAGE should be calculated from @ least 5-7 BP readings taken @ different times of day on different days of week. You should not respond to isolated BP readings , but rather the AVERAGE for that week

## 2011-08-19 MED ORDER — NEBIVOLOL HCL 5 MG PO TABS: 5.0000 mg | ORAL_TABLET | Freq: Every day | ORAL | Status: DC

## 2011-08-19 NOTE — Telephone Encounter (Signed)
Discuss with patient, Rx sent. 

## 2011-08-31 ENCOUNTER — Telehealth: Payer: Self-pay | Admitting: Internal Medicine

## 2011-08-31 NOTE — Telephone Encounter (Signed)
Pt c/o increase fatigue and rash under arm since starting bystolic. Pt note the med is controlling BP. Morning BP 140/85 and PM BP 135/80.Please advise

## 2011-08-31 NOTE — Telephone Encounter (Signed)
Most important is the excellent blood pressure control. This drug is not associated with erectile dysfunction or other adversel effects of Beta blockers. I would give this was a while longer to see if the concerns resolve over 7-10 days. If not ; please see me

## 2011-08-31 NOTE — Telephone Encounter (Signed)
Left message to call office

## 2011-09-02 NOTE — Telephone Encounter (Signed)
Left message to call office

## 2011-09-04 NOTE — Telephone Encounter (Signed)
Discuss with patient  

## 2011-09-24 ENCOUNTER — Ambulatory Visit (INDEPENDENT_AMBULATORY_CARE_PROVIDER_SITE_OTHER): Payer: PRIVATE HEALTH INSURANCE | Admitting: Family Medicine

## 2011-09-24 ENCOUNTER — Encounter: Payer: Self-pay | Admitting: Family Medicine

## 2011-09-24 VITALS — BP 125/80 | HR 69 | Ht 72.0 in | Wt 200.0 lb

## 2011-09-24 DIAGNOSIS — M7580 Other shoulder lesions, unspecified shoulder: Secondary | ICD-10-CM

## 2011-09-24 DIAGNOSIS — M719 Bursopathy, unspecified: Secondary | ICD-10-CM

## 2011-09-24 DIAGNOSIS — S43429A Sprain of unspecified rotator cuff capsule, initial encounter: Secondary | ICD-10-CM

## 2011-09-24 DIAGNOSIS — M75101 Unspecified rotator cuff tear or rupture of right shoulder, not specified as traumatic: Secondary | ICD-10-CM

## 2011-09-24 MED ORDER — NITROGLYCERIN 0.2 MG/HR TD PT24: MEDICATED_PATCH | TRANSDERMAL | Status: DC

## 2011-09-24 MED ORDER — TRAMADOL HCL 50 MG PO TABS: 50.0000 mg | ORAL_TABLET | Freq: Two times a day (BID) | ORAL | Status: DC

## 2011-09-28 NOTE — Progress Notes (Signed)
Subjective:    Patient ID: Nicholas Bonilla, male    DOB: 1961/05/04, 49 y.o.   MRN: [id number]  HPI  Nicholas Bonilla is  Pleasant 50 yo male patient complaining of B/l shoulder pain for the last 5 month. He denies any injuries to his shoulder. Gradual onset pain, right side worse than the left side. He saw Dr. Norton Blizzard in July who recommended RTC HEP. He has not been compliance with the exercise. He states that the right shoulder pain is located anterolateral, 5/10 intensity, sharp, on and off, no radiated, wakes him up at night,worse with overhead activities, no numbness no tingling. The left shoulder pain is located anterolateral, 3/10, on and off, not radiated, dull, worse with motion and overhead activities, tylenol help some. No numbness or tingling.  Patient Active Problem List  Diagnoses  . VITAMIN D DEFICIENCY  . HOMOCYSTINEMIA  . HYPERLIPIDEMIA  . OBSESSIVE-COMPULSIVE DISORDER  . ADD  . HYPERTENSION, ESSENTIAL NOS  . PREMATURE VENTRICULAR CONTRACTIONS  . PHLEBITIS, LOWER EXTREMITY  . 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 LOW BACK PAIN SYNDROME  . COLITIS, HX OF  . NEPHROLITHIASIS, HX OF  . Shoulder pain   Current Outpatient Prescriptions on File Prior to Visit  Medication Sig Dispense Refill  . acetaminophen (TYLENOL) 500 MG tablet Take 500 mg by mouth. 2-4 by mouth as needed only       . aspirin 325 MG tablet Take 325 mg by mouth daily.        . carisoprodol (SOMA) 350 MG tablet Take 350 mg by mouth 4 (four) times daily as needed.        . carvedilol (COREG) 12.5 MG tablet Take 1 tablet (12.5 mg total) by mouth 2 (two) times daily. 1/2 pill twice a day  90 tablet  5  . Cholecalciferol (VITAMIN D) 2000 UNITS CAPS Take 5,000 Units by mouth daily.       . fish oil-omega-3 fatty acids 1000 MG capsule Take 1 g by mouth 2 (two) times daily.        Marland Kitchen lisdexamfetamine (VYVANSE) 60 MG capsule Take 60 mg by mouth every morning.        . Mesalamine (ASACOL HD) 800 MG  TBEC Take by mouth. 2 by mouth three times daily       . nebivolol (BYSTOLIC) 5 MG tablet Take 1 tablet (5 mg total) by mouth daily.  30 tablet  5  . Olive Leaf 500 MG CAPS Take by mouth. 1 by mouth three times daily       . Probiotic Product (PROBIOTIC PO) Take by mouth. 2 x daily       . sertraline (ZOLOFT) 50 MG tablet Take 50 mg by mouth as directed. 2 by mouth daily        Allergies  Allergen Reactions  . Naproxen Sodium     REACTION: colitis flare  . Promethazine Hcl     REACTION: mental status changes     Review of Systems  Constitutional: Negative for fever, chills, diaphoresis and fatigue.  Musculoskeletal: Negative for myalgias, back pain, joint swelling and gait problem.  Neurological: Negative for weakness and numbness.       Objective:   Physical Exam  Constitutional: He appears well-developed and well-nourished.       BP 125/80  Pulse 69  Ht 6' (1.829 m)  Wt 200 lb (90.719 kg)  BMI 27.12 kg/m2   Neck: Normal range of motion. Neck  supple.  Pulmonary/Chest: Effort normal.  Musculoskeletal:       Right shoulder with intact skin. No swelling no hematomas. Full range of motion for flexion extension internal, external rotation abduction and adduction. Hawkins test positive for tear cuff impingement . A.c. joint tenderness to palpation with positive crossover test . Empty can test negative for rotator cuff tear produces pain. Speed test negative for biceps tendinoplasty No tenderness at the bicipital groove. Strength 5 over 5 with internal and external rotation . Sensation is intact .    Left shoulder with intact skin. No swelling no hematomas. Full range of motion for flexion extension internal, external rotation abduction and adduction. Hawkins test positive for tear cuff impingement milder compare with right side . A.c. mild joint tenderness to palpation with positive crossover test . Empty can test negative for rotator cuff tear but produces pain. Speed  test negative for biceps tendinoplasty No tenderness at the bicipital groove. Strength 5 over 5 with internal and external rotation . Sensation is intact .    Neurological: He is alert.  Skin: Skin is warm. No rash noted. No erythema. No pallor.  Psychiatric: He has a normal mood and affect.   MSK [place] :  Right shoulder , partial tear of the supraspinatus with hypoechoic ares intra tendon. Biceps tendon split.No fluid. AC with good space. Mild impingement. Left shoulder with intact supraspinatus. Mild bursitis. No tear. Mild impingements. Good space in the [facility] joint.        Assessment & Plan:   1. Rotator cuff tear, right  nitroGLYCERIN (NITRODUR - DOSED IN MG/24 HR) 0.2 mg/hr, traMADol (ULTRAM) 50 MG tablet  2. Rotator cuff tendinitis  nitroGLYCERIN (NITRODUR - DOSED IN MG/24 HR) 0.2 mg/hr, traMADol (ULTRAM) 50 MG tablet   NTG protocol for the Right shoulder. HEP for RTC strengthening. Tramadol for pain. F/U in 4 weeks.

## 2011-09-30 ENCOUNTER — Ambulatory Visit: Payer: PRIVATE HEALTH INSURANCE | Admitting: Family Medicine

## 2011-10-29 ENCOUNTER — Ambulatory Visit (INDEPENDENT_AMBULATORY_CARE_PROVIDER_SITE_OTHER): Payer: PRIVATE HEALTH INSURANCE | Admitting: Family Medicine

## 2011-10-29 VITALS — BP 127/77

## 2011-10-29 DIAGNOSIS — S43429A Sprain of unspecified rotator cuff capsule, initial encounter: Secondary | ICD-10-CM

## 2011-10-29 DIAGNOSIS — M7580 Other shoulder lesions, unspecified shoulder: Secondary | ICD-10-CM | POA: Insufficient documentation

## 2011-10-29 DIAGNOSIS — M67919 Unspecified disorder of synovium and tendon, unspecified shoulder: Secondary | ICD-10-CM

## 2011-10-29 DIAGNOSIS — M75101 Unspecified rotator cuff tear or rupture of right shoulder, not specified as traumatic: Secondary | ICD-10-CM

## 2011-10-29 NOTE — Patient Instructions (Signed)
Continue nitro patch daily Continue shoulder strengthening exercises. Moist heat before doing the exercise.

## 2011-10-29 NOTE — Progress Notes (Signed)
Subjective:    Patient ID: Nicholas Bonilla, male    DOB: 08-23-1961, 50 y.o.   MRN: [id number]  HPI  Nicholas Bonilla is a pleasant 50 years old male patient coming to follow up on his right shoulder pain. He was seen previously for weeks ago and he was started on a nitroglycerin patch in [place] exercised for right rotator cuff tear. He states that he feels a 30-40% better. He still has on a nominal pain on the anterolateral aspect of his right shoulder, dull ache, 3/10 in intensity, no radiated, worse with overhead activities and trying to reach behind his back, improved by rest. He has tolerated the Nitropatch well. No major side effects, headaches at the beginning but they haven't subsided. He states that he has not been able to do his exercises on daily basis. He has been using Tylenol on and off for pain control. No numbness or tingling radiating distally on his right upper extremity   The pain of his left shoulder hasn't improved and he is filling just tightness on the posterior aspect of his left shoulder.   Patient Active Problem List  Diagnoses  . VITAMIN D DEFICIENCY  . HOMOCYSTINEMIA  . HYPERLIPIDEMIA  . OBSESSIVE-COMPULSIVE DISORDER  . ADD  . HYPERTENSION, ESSENTIAL NOS  . PREMATURE VENTRICULAR CONTRACTIONS  . PHLEBITIS, LOWER EXTREMITY  . 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 LOW BACK PAIN SYNDROME  . COLITIS, HX OF  . NEPHROLITHIASIS, HX OF  . Shoulder pain  . Rotator cuff tendonitis   Current Outpatient Prescriptions on File Prior to Visit  Medication Sig Dispense Refill  . acetaminophen (TYLENOL) 500 MG tablet Take 500 mg by mouth. 2-4 by mouth as needed only       . aspirin 325 MG tablet Take 325 mg by mouth daily.        Marland Kitchen aspirin 81 MG tablet Take 81 mg by mouth daily.        . carisoprodol (SOMA) 350 MG tablet Take 350 mg by mouth 4 (four) times daily as needed.        . carvedilol (COREG) 12.5 MG tablet Take 1 tablet (12.5 mg total) by mouth 2 (two) times  daily. 1/2 pill twice a day  90 tablet  5  . Cholecalciferol (VITAMIN D) 2000 UNITS CAPS Take 5,000 Units by mouth daily.       . fish oil-omega-3 fatty acids 1000 MG capsule Take 1 g by mouth 2 (two) times daily.        Marland Kitchen lisdexamfetamine (VYVANSE) 60 MG capsule Take 60 mg by mouth every morning.        . Mesalamine (ASACOL HD) 800 MG TBEC Take by mouth. 2 by mouth three times daily       . Multiple Vitamin (MULTIVITAMIN) tablet Take 1 tablet by mouth daily.        . nebivolol (BYSTOLIC) 5 MG tablet Take 1 tablet (5 mg total) by mouth daily.  30 tablet  5  . nitroGLYCERIN (NITRODUR - DOSED IN MG/24 HR) 0.2 mg/hr Use 1/4 of the patch in the right shoulder every 24 hours.  30 patch  2  . Nutritional Supplements (DHEA PO) Take 5 mg by mouth daily.        Nicholas Bonilla 500 MG CAPS Take by mouth. 1 by mouth three times daily       . Probiotic Product (PROBIOTIC PO) Take by mouth. 2 x daily       .  sertraline (ZOLOFT) 50 MG tablet Take 50 mg by mouth as directed. 2 by mouth daily      . traMADol (ULTRAM) 50 MG tablet Take 1 tablet (50 mg total) by mouth 2 (two) times daily.  20 tablet  0     Review of Systems  Constitutional: Negative for fever, chills, diaphoresis and fatigue.  Musculoskeletal: Negative for myalgias, back pain, joint swelling, arthralgias and gait problem.  Neurological: Negative for dizziness and weakness.       Objective:   Physical Exam  Constitutional: He appears well-developed and well-nourished.       BP 127/77   Neck: Normal range of motion. Neck supple.  Musculoskeletal:       Right shoulder with intact skin. No swelling no hematomas. Full range of motion for flexion extension internal, external rotation abduction and adduction. Hawkins test positive for tear cuff impingement . A.c. joint tenderness to palpation with positive crossover test . Empty can test negative for rotator cuff tear  But produces pain less than 4 weeks ago. Speed test negative for biceps  tendinoplasty No tenderness at the bicipital groove. Strength 5 over 5 with internal and external rotation . Sensation is intact .    Left shoulder with intact skin. No swelling no hematomas. Full range of motion for flexion extension internal, external rotation abduction and adduction. Hawkins test negative for tear cuff impingement milder compare with right side . A.c. mild joint tenderness to palpation with positive crossover test . Empty can test negative for rotator cuff . Speed test negative for biceps tendinoplasty No tenderness at the bicipital groove. Strength 5 over 5 with internal and external rotation . Sensation is intact .    Neurological: He is alert.  Skin: Skin is warm. No rash noted. No erythema.  Psychiatric: He has a normal mood and affect.     MSK U/S Right shoulder: Throwing a tear of the supraspinatus tendon at the attachment in the humeral footprint. A wound in the subacromial bursa.    Assessment & Plan:   1. Rotator cuff tear, right   2. Rotator cuff tendonitis    Continue nitro patch daily Continue shoulder strengthening exercises. Moist heat before doing the exercise. F/U in 4 weeks.

## 2011-11-12 ENCOUNTER — Ambulatory Visit: Payer: Self-pay

## 2011-11-26 ENCOUNTER — Ambulatory Visit (INDEPENDENT_AMBULATORY_CARE_PROVIDER_SITE_OTHER): Payer: PRIVATE HEALTH INSURANCE | Admitting: Family Medicine

## 2011-11-26 VITALS — BP 120/70

## 2011-11-26 DIAGNOSIS — M67919 Unspecified disorder of synovium and tendon, unspecified shoulder: Secondary | ICD-10-CM

## 2011-11-26 DIAGNOSIS — S43429A Sprain of unspecified rotator cuff capsule, initial encounter: Secondary | ICD-10-CM

## 2011-11-26 DIAGNOSIS — M75101 Unspecified rotator cuff tear or rupture of right shoulder, not specified as traumatic: Secondary | ICD-10-CM

## 2011-11-26 DIAGNOSIS — M7580 Other shoulder lesions, unspecified shoulder: Secondary | ICD-10-CM

## 2011-11-26 NOTE — Patient Instructions (Signed)
Increase nitro patch to a 1/2 of a patch every other day and work way to a 12 patch every day. Continue home exercise program for Rotator cuff. Follow up in 4-6 weeks, we will repeat U/S in that visit.

## 2011-11-26 NOTE — Progress Notes (Signed)
Subjective:    Patient ID: Nicholas Bonilla, male    DOB: [birth year], 50 y.o.   MRN: [id number]  HPI  Nicholas Bonilla is coming today for followup on his bilateral shoulder pain. He had a rotator cuff tear in the right shoulder diagnosed with ultrasound. He stayed that he is 70% better regarding the pain on his right shoulder. His function is great. He is able to do all the activities that he wants to do. Regarding his left shoulder he has improved 50-60, denies any pain on his left shoulder. He feels on and off tightness in the back of his left shoulder. No numbness or tingling distally no pain in his left shoulder.  He has been using the nitroglycerin patch for 2 month on the right shoulder and he has been doing rotator cuff strengthening exercise in both shoulders however he states that he has not been too compliant with the exercises.  Patient Active Problem List  Diagnoses  . VITAMIN D DEFICIENCY  . HOMOCYSTINEMIA  . HYPERLIPIDEMIA  . OBSESSIVE-COMPULSIVE DISORDER  . ADD  . HYPERTENSION, ESSENTIAL NOS  . PREMATURE VENTRICULAR CONTRACTIONS  . PHLEBITIS, LOWER EXTREMITY  . 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 LOW BACK PAIN SYNDROME  . COLITIS, HX OF  . NEPHROLITHIASIS, HX OF  . Shoulder pain  . Rotator cuff tendonitis  . Rotator cuff tear, right   Current Outpatient Prescriptions on File Prior to Visit  Medication Sig Dispense Refill  . acetaminophen (TYLENOL) 500 MG tablet Take 500 mg by mouth. 2-4 by mouth as needed only       . aspirin 325 MG tablet Take 325 mg by mouth daily.        Marland Kitchen aspirin 81 MG tablet Take 81 mg by mouth daily.        . carisoprodol (SOMA) 350 MG tablet Take 350 mg by mouth 4 (four) times daily as needed.        . carvedilol (COREG) 12.5 MG tablet Take 1 tablet (12.5 mg total) by mouth 2 (two) times daily. 1/2 pill twice a day  90 tablet  5  . Cholecalciferol (VITAMIN D) 2000 UNITS CAPS Take 5,000 Units by mouth daily.       . fish oil-omega-3  fatty acids 1000 MG capsule Take 1 g by mouth 2 (two) times daily.        Marland Kitchen lisdexamfetamine (VYVANSE) 60 MG capsule Take 60 mg by mouth every morning.        . Mesalamine (ASACOL HD) 800 MG TBEC Take by mouth. 2 by mouth three times daily       . Multiple Vitamin (MULTIVITAMIN) tablet Take 1 tablet by mouth daily.        . nebivolol (BYSTOLIC) 5 MG tablet Take 1 tablet (5 mg total) by mouth daily.  30 tablet  5  . nitroGLYCERIN (NITRODUR - DOSED IN MG/24 HR) 0.2 mg/hr Use 1/4 of the patch in the right shoulder every 24 hours.  30 patch  2  . Nutritional Supplements (DHEA PO) Take 5 mg by mouth daily.        Gracelyn Nurse Leaf 500 MG CAPS Take by mouth. 1 by mouth three times daily       . Probiotic Product (PROBIOTIC PO) Take by mouth. 2 x daily       . sertraline (ZOLOFT) 50 MG tablet Take 50 mg by mouth as directed. 2 by mouth daily      .  traMADol (ULTRAM) 50 MG tablet Take 1 tablet (50 mg total) by mouth 2 (two) times daily.  20 tablet  0   Allergies  Allergen Reactions  . Naproxen Sodium     REACTION: colitis flare  . Promethazine Hcl     REACTION: mental status changes      Review of Systems  Constitutional: Negative for fever, chills, diaphoresis and fatigue.  Musculoskeletal: Negative for back pain, joint swelling and gait problem.  Neurological: Negative for weakness and numbness.       Objective:   Physical Exam  Constitutional: He appears well-developed and well-nourished.       BP 120/70   Pulmonary/Chest: Effort normal.  Musculoskeletal:         Right shoulder with intact skin. No swelling no hematomas. Full range of motion for flexion extension internal, external rotation abduction and adduction. Hawkins test negative for tear cuff impingement . A.c. joint with no TTP . Empty can test negative for rotator cuff tear with mild weakness. Speed test negative for biceps tendinoplasty No tenderness at the bicipital groove. Strength 5 over 5 with internal and external  rotation . Sensation is intact .    Left shoulder with intact skin. No swelling no hematomas. Full range of motion for flexion extension internal, external rotation abduction and adduction. Hawkins test negative for tear cuff impingement . A.c. mild joint tenderness to palpation with positive crossover test . Empty can test negative for rotator cuff . Speed test negative for biceps tendinoplasty No tenderness at the bicipital groove. Strength 5 over 5 with internal and external rotation . Sensation is intact .     Neurological: He is alert.  Skin: Skin is warm. No rash noted. No erythema.  Psychiatric: He has a normal mood and affect.        Assessment & Plan:   1. Rotator cuff tendonitis   2. Rotator cuff tear, right    Increase nitro patch to a 1/2 of a patch every other day and work way to a 12 patch every day. Continue home exercise program for Rotator cuff. Follow up in 4-6 weeks, we will repeat U/S in that visit.

## 2012-01-14 ENCOUNTER — Ambulatory Visit: Payer: PRIVATE HEALTH INSURANCE | Admitting: Family Medicine

## 2012-01-21 ENCOUNTER — Ambulatory Visit: Payer: PRIVATE HEALTH INSURANCE | Admitting: Family Medicine

## 2012-01-28 ENCOUNTER — Encounter: Payer: Self-pay | Admitting: Family Medicine

## 2012-01-28 ENCOUNTER — Ambulatory Visit (INDEPENDENT_AMBULATORY_CARE_PROVIDER_SITE_OTHER): Payer: PRIVATE HEALTH INSURANCE | Admitting: Family Medicine

## 2012-01-28 VITALS — BP 107/71 | HR 71

## 2012-01-28 DIAGNOSIS — M719 Bursopathy, unspecified: Secondary | ICD-10-CM

## 2012-01-28 DIAGNOSIS — S43429A Sprain of unspecified rotator cuff capsule, initial encounter: Secondary | ICD-10-CM

## 2012-01-28 DIAGNOSIS — M7580 Other shoulder lesions, unspecified shoulder: Secondary | ICD-10-CM

## 2012-01-28 DIAGNOSIS — M75101 Unspecified rotator cuff tear or rupture of right shoulder, not specified as traumatic: Secondary | ICD-10-CM

## 2012-01-28 NOTE — Progress Notes (Signed)
Subjective:    Patient ID: Nicholas Bonilla, male    DOB: January 22, 1961, 51 y.o.   MRN: [id number]  HPI  Nicholas Bonilla is going to take a follow up of his right rotator cuff tear. He stated that he is improving. He is now on half of nitroglycerin patch for the last month. He has been under nitroglycerin patch protocol for 3 month in total. He states a history of 25% better compared with last visit.  He denies any night pain. He denies any numbness or tingling.  His left shoulder has improved significantly.  Patient Active Problem List  Diagnoses  . VITAMIN D DEFICIENCY  . HOMOCYSTINEMIA  . HYPERLIPIDEMIA  . OBSESSIVE-COMPULSIVE DISORDER  . ADD  . HYPERTENSION, ESSENTIAL NOS  . PREMATURE VENTRICULAR CONTRACTIONS  . PHLEBITIS, LOWER EXTREMITY  . 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 LOW BACK PAIN SYNDROME  . COLITIS, HX OF  . NEPHROLITHIASIS, HX OF  . Shoulder pain  . Rotator cuff tendonitis  . Rotator cuff tear, right   Current Outpatient Prescriptions on File Prior to Visit  Medication Sig Dispense Refill  . acetaminophen (TYLENOL) 500 MG tablet Take 500 mg by mouth. 2-4 by mouth as needed only       . aspirin 325 MG tablet Take 325 mg by mouth daily.        Marland Kitchen aspirin 81 MG tablet Take 81 mg by mouth daily.        . carisoprodol (SOMA) 350 MG tablet Take 350 mg by mouth 4 (four) times daily as needed.        . carvedilol (COREG) 12.5 MG tablet Take 1 tablet (12.5 mg total) by mouth 2 (two) times daily. 1/2 pill twice a day  90 tablet  5  . Cholecalciferol (VITAMIN D) 2000 UNITS CAPS Take 5,000 Units by mouth daily.       . fish oil-omega-3 fatty acids 1000 MG capsule Take 1 g by mouth 2 (two) times daily.        Marland Kitchen lisdexamfetamine (VYVANSE) 60 MG capsule Take 60 mg by mouth every morning.        . Mesalamine (ASACOL HD) 800 MG TBEC Take by mouth. 2 by mouth three times daily       . Multiple Vitamin (MULTIVITAMIN) tablet Take 1 tablet by mouth daily.        . nebivolol  (BYSTOLIC) 5 MG tablet Take 1 tablet (5 mg total) by mouth daily.  30 tablet  5  . nitroGLYCERIN (NITRODUR - DOSED IN MG/24 HR) 0.2 mg/hr Use 1/4 of the patch in the right shoulder every 24 hours.  30 patch  2  . Nutritional Supplements (DHEA PO) Take 5 mg by mouth daily.        Gracelyn Nurse Leaf 500 MG CAPS Take by mouth. 1 by mouth three times daily       . Probiotic Product (PROBIOTIC PO) Take by mouth. 2 x daily       . sertraline (ZOLOFT) 50 MG tablet Take 50 mg by mouth as directed. 2 by mouth daily      . traMADol (ULTRAM) 50 MG tablet Take 1 tablet (50 mg total) by mouth 2 (two) times daily.  20 tablet  0   Allergies  Allergen Reactions  . Naproxen Sodium     REACTION: colitis flare  . Promethazine Hcl     REACTION: mental status changes      Review of Systems  Constitutional: Negative for fever, chills, diaphoresis and fatigue.  Musculoskeletal: Negative for myalgias, back pain, joint swelling and gait problem.  Neurological: Negative for weakness and numbness.       Objective:   Physical Exam  Constitutional: He is oriented to person, place, and time. He appears well-developed and well-nourished.       BP 107/71  Pulse 71   Pulmonary/Chest: Effort normal.  Neurological: He is alert and oriented to person, place, and time.         Right shoulder with intact skin. No swelling no hematomas. Full range of motion for flexion extension internal, external rotation abduction and adduction. Hawkins test negative for tear cuff impingement . Neer test positive for RTC impingment A.c. joint with no TTP . Empty can test positive for rotator cuff tear with severe weakness. Speed test negative for biceps tendinoplasty No tenderness at the bicipital groove. Strength 5 over 5 with internal and external rotation . Sensation is intact .      Skin: Skin is warm. No rash noted. No erythema.  Psychiatric: He has a normal mood and affect. His behavior is normal. Thought content normal.        MSK U/S : R shoulder with a supraspinatus tear, slightly 1.5 cm from the  Endplate. There is increase the blood. There is Sub acromial  Bursitis. There is tendinitis of the biceps tendon.     Assessment & Plan:   1. Rotator cuff tear, right   2. Rotator cuff tendonitis    Continue 1/2 of nitro patch daily. Continue RTC strengthening exercise. Discussed on extent with the patient the possibility of surgical evaluation, he stated that he is not interested in any surgical approach for this problem at the present time. F/U in 6 weeks

## 2012-02-03 ENCOUNTER — Ambulatory Visit (INDEPENDENT_AMBULATORY_CARE_PROVIDER_SITE_OTHER): Payer: PRIVATE HEALTH INSURANCE | Admitting: Internal Medicine

## 2012-02-03 VITALS — BP 122/82 | HR 83 | Temp 98.7°F | Ht 73.0 in | Wt 204.0 lb

## 2012-02-03 DIAGNOSIS — I10 Essential (primary) hypertension: Secondary | ICD-10-CM

## 2012-02-03 MED ORDER — NEBIVOLOL HCL 5 MG PO TABS: ORAL_TABLET | ORAL | Status: DC

## 2012-02-03 NOTE — Assessment & Plan Note (Signed)
Pressure is well controlled based on serial recordings. The Bystolic will be decreased from 5 mg daily to one half pill daily with ongoing monitor blood pressure. As the blood pressure issue appeared only with the initiation of steroids; it is possible it could be weaned completely. Blood pressure goals discussed. Blood pressure monitor is necessary because of family history of hypertension in his father and sister.

## 2012-02-03 NOTE — Patient Instructions (Signed)
Blood Pressure Goal  Ideally is an AVERAGE < 135/85. This AVERAGE should be calculated from @ least 5-7 BP readings taken @ different times of day on different days of week. You should not respond to isolated BP readings , but rather the AVERAGE for that week  

## 2012-02-03 NOTE — Progress Notes (Signed)
  Subjective:    Patient ID: Nicholas Bonilla, male    DOB: 06-03-1961, 51 y.o.   MRN: [id number]  HPI For the last several months he's noted some daytime somnolence chiefly at breakfast time or around noon. It tends to occur when he is inactive and unengaged. He questions whether this is related to Bystolic as he had some symptoms of this type with carvedilol. His blood pressures tended to average 117/71@  recent medical visits. His blood pressure originally began when he was on increased steroids for colitis.  His father and sister have hypertension; there is no family history of stroke  Review of Systems He denies chest pain, palpitations, dyspnea, edema, or claudication.  He has no symptoms of reactive hypoglycemia midmorning or midafternoon and 2 hours after eating       Objective:   Physical Exam He appears healthy and well-nourished; he is in no acute distress  No carotid bruits are present.  Heart rhythm and rate are normal with no  gallops.Grade 1/6  Musical systolic murmur  @ apex  Chest is clear with no increased work of breathing  There is no evidence of aortic aneurysm or renal artery bruits  He has no clubbing or edema.   Pedal pulses are intact   No ischemic skin changes are present         Assessment & Plan:

## 2012-03-10 ENCOUNTER — Ambulatory Visit: Payer: PRIVATE HEALTH INSURANCE | Admitting: Family Medicine

## 2012-04-01 ENCOUNTER — Telehealth: Payer: Self-pay | Admitting: Internal Medicine

## 2012-04-01 MED ORDER — NEBIVOLOL HCL 2.5 MG PO TABS: 2.5000 mg | ORAL_TABLET | Freq: Every day | ORAL | Status: DC

## 2012-04-01 NOTE — Telephone Encounter (Signed)
Patient called triage line stating he needs to speak with Dr. Alwyn Ren about medication change. He states he was told to call the office to discuss this. Patient did not specify which medication.

## 2012-04-01 NOTE — Telephone Encounter (Signed)
Rx sent for 30/5 as per note patient fills for #30 at a time

## 2012-04-01 NOTE — Telephone Encounter (Signed)
Called Pt back and he states that he just spoke with triage nurse in reference to this matter will await there note.

## 2012-04-01 NOTE — Telephone Encounter (Signed)
Caller: Prestin/Patient; PCP: Marga Melnick; CB#: [phone]; ; ; Call regarding Medication Issue;   Patient states he was prescribed Bystolic 5mg .; 1/5 tablet daily. Patient states he was informed to call office with progress. States he is doing well on current dosage. States he monitors BP q.o.d. and that BP has been in the 120/80 range.  Patient states it is time to refill his medication. States he will run out of medication 04/01/12. PATIENT INQUIRING IF RX FOR BYSTOLIC IS GOING TO BE CHANGED TO 2.5MG  TABLETS TO TAKE ONE DAILY OR IF HE SHOULD REFILL CURRENT RX AND CONTINUE TO TAKE 1/2 TABLET DAILY. PATIENT RECEIVES 30 DAY SUPPLY OF MEDICATIONS. PATIENT CAN BE REACHED @ [phone]. PATIENT USES [facility] IN [place] @ [phone]. PATIENT STATES HE ALSO WANTS TO INFORN DR. HOPPER THAT HIS PSYCHIATRIST CHANGED HIS MEDICATION FOR OCD FROM ZOLOFT TO EFEXOR 75MG . DAILY.

## 2012-04-01 NOTE — Telephone Encounter (Signed)
Change to 2.5 mg daily dispense 90 refill x1

## 2012-10-03 ENCOUNTER — Other Ambulatory Visit: Payer: Self-pay | Admitting: Internal Medicine

## 2012-10-03 MED ORDER — NEBIVOLOL HCL 2.5 MG PO TABS: 2.5000 mg | ORAL_TABLET | Freq: Every day | ORAL | Status: DC

## 2012-10-03 NOTE — Telephone Encounter (Signed)
Spoke wt/pt he scheduled for 1.15.14 at 10:30am and he stated he would come fasting

## 2012-10-03 NOTE — Telephone Encounter (Signed)
Please contact patient for an appointment CPX, last OV 07/06/2011. Once appointment schedule medication to be refilled

## 2012-10-03 NOTE — Telephone Encounter (Signed)
RX sent

## 2012-10-03 NOTE — Telephone Encounter (Signed)
refill BYSTOLIC 2.5 MG Take 1 tablet (2.5 mg total) by mouth daily. --last fill 09.17.13 #30--last ov 2.20.13

## 2012-10-27 ENCOUNTER — Other Ambulatory Visit: Payer: Self-pay

## 2012-10-31 ENCOUNTER — Other Ambulatory Visit: Payer: Self-pay | Admitting: Unknown Physician Specialty

## 2012-10-31 LAB — CLOSTRIDIUM DIFFICILE BY PCR

## 2012-12-06 ENCOUNTER — Telehealth: Payer: Self-pay | Admitting: Internal Medicine

## 2012-12-06 NOTE — Telephone Encounter (Signed)
Patient Information:  Caller Name: Emmet  Phone: [phone]  Patient: Nicholas Bonilla, Nicholas Bonilla  Gender: Male  DOB: 12/14/1898  Age: 50 Years  PCP: Marga Melnick  Office Follow Up:  Does the office need to follow up with this patient?: Yes  Instructions For The Office: Patient increasing medication on his own, states he has done this in the past with Prednisone taper- FYI  RN Note:  Advised I would forward information as requested.  Symptoms  Reason For Call & Symptoms: Patient states he is currenlty taking Prednisone 40mg  x 4 weeks for Ulcerative Colitis.  he states it makes his B/P elevated. Reading 150/90.  He is taking Bystolic 2.5mg  daily for his blood pressure.  In the past when this has happened, he increases the Bystolic to 5mg  daily until pressure is controlled. He just wants Dr. Alwyn Ren aware that he is making medication adjustment as indicated. Reviewed Information on Hypertension Protocol patient is just making [place] aware he is changing medication. Advised I would forward information to Dr. Alwyn Ren  Reviewed Health History In EMR: Yes  Reviewed Medications In EMR: Yes  Reviewed Allergies In EMR: Yes  Reviewed Surgeries / Procedures: No  Date of Onset of Symptoms: 11/30/2012  Guideline(s) Used:  High Blood Pressure  Disposition Per Guideline:   See Within 2 Weeks in Office  Reason For Disposition Reached:   BP > 140/90 and is taking BP medications  Advice Given:  How Much Sodium (Salt) Should You Eat Each Day?  Aim to eat less than 1,500 mg of sodium each day.  Remember that one teaspoon of salt has 2,300 mg of sodium.  Unfortunately, 75% of the salt in the average person's diet is in pre-processed foods.  General:  Untreated high blood pressure may cause damage to the heart, brain, kidneys, and eyes.  Treatment of high blood pressure can reduce the risk of stroke, heart attack, and heart failure.  The goal of blood pressure treatment for most patients with hypertension is  to keep the blood pressure under 140/90.  Call Back If:  Headache, blurred vision, difficulty talking, or difficulty walking occurs  Chest pain or difficulty breathing occurs  You want to go in to the office for a blood pressure check  You become worse.

## 2012-12-08 NOTE — Telephone Encounter (Signed)
Dr.Hopper was verbally informed and indicated that it is ok for patient to increase to 5 mg while taking prednisone (if b/p elevated), I called patient and informed him of Dr.Hopper response

## 2012-12-19 ENCOUNTER — Telehealth: Payer: Self-pay | Admitting: Internal Medicine

## 2012-12-19 NOTE — Telephone Encounter (Signed)
Refill: Bystolic 2.5 mg tab. Take 1 tablet by mouth every day. Qty 30. Last fill 12-02-12

## 2012-12-20 MED ORDER — NEBIVOLOL HCL 2.5 MG PO TABS: 2.5000 mg | ORAL_TABLET | Freq: Every day | ORAL | Status: DC

## 2012-12-20 NOTE — Telephone Encounter (Signed)
Rx sent 

## 2012-12-23 ENCOUNTER — Telehealth: Payer: Self-pay | Admitting: Internal Medicine

## 2012-12-23 NOTE — Telephone Encounter (Signed)
I called the pharmacy to verify if rx was received on 12/20/12, I was told rx received on 12/20/12 but requires a prior auth.  I contacted the patient and left message informing him of this process, we have a high volume of prior authorizations. We can provide samples if patient would like until process complete

## 2012-12-23 NOTE — Telephone Encounter (Addendum)
I called PA number given [phone]) on form from pharmacy and was told that number was not for  members, they were unable to give me an alternate number. I called the pharmacy for prior auth number and they did not have an alternate number either. I was told by the pharmacy to contact patient and get number off his insurance card.  I called patient and gave him a status update, patient provided me with number on temporary card [id number]. I called given number and recording stated they were currently closed.    I went to the website [employer] and printed off form for Bystolic. Once form completed it will be faxed

## 2012-12-23 NOTE — Telephone Encounter (Signed)
[facility] Triage Call Report Triage Record Num: [id number] Operator: Ether Griffins Patient Name: Nicholas Bonilla Call Date & Time: 12/22/2012 6:04:39PM Patient Phone: [phone] PCP: Marga Melnick Patient Gender: Male PCP Fax : [phone] Patient DOB: [birth year] Practice Name: [facility] Reason for Call: Caller: Nicholas Bonilla/Patient; PCP: Marga Melnick; CB#: [phone]; Calling about needs medication refill--Bystolic. Pt is very angry because he has been trying to get med refilled all week, and has been out for 2 days. Pharmacy sent refill request on 12/20/12 and they have not heard from office. RN offered to call in some pills to hold pt over but he refused stating that his pharmacy is already closed for the night. He wants a message sent to the office and expects it to be taken care of tomorrow. Advised pt to follow up with office in am. Protocol(s) Used: Information Only Calls, No Triage Recommended Outcome per Protocol: Provide Information or Advice Only Reason for Outcome: Requesting prescription refill Care Advice: ~

## 2012-12-28 ENCOUNTER — Ambulatory Visit (INDEPENDENT_AMBULATORY_CARE_PROVIDER_SITE_OTHER): Payer: BC Managed Care – PPO | Admitting: Internal Medicine

## 2012-12-28 ENCOUNTER — Encounter: Payer: Self-pay | Admitting: Internal Medicine

## 2012-12-28 VITALS — BP 124/78 | HR 86 | Temp 98.6°F | Resp 12 | Ht 73.0 in | Wt 210.0 lb

## 2012-12-28 DIAGNOSIS — Z0000 Encounter for general adult medical examination without abnormal findings: Secondary | ICD-10-CM

## 2012-12-28 DIAGNOSIS — E782 Mixed hyperlipidemia: Secondary | ICD-10-CM

## 2012-12-28 DIAGNOSIS — F429 Obsessive-compulsive disorder, unspecified: Secondary | ICD-10-CM

## 2012-12-28 DIAGNOSIS — Z8719 Personal history of other diseases of the digestive system: Secondary | ICD-10-CM

## 2012-12-28 DIAGNOSIS — M810 Age-related osteoporosis without current pathological fracture: Secondary | ICD-10-CM

## 2012-12-28 DIAGNOSIS — F988 Other specified behavioral and emotional disorders with onset usually occurring in childhood and adolescence: Secondary | ICD-10-CM

## 2012-12-28 MED ORDER — METOPROLOL TARTRATE 25 MG PO TABS: 25.0000 mg | ORAL_TABLET | Freq: Two times a day (BID) | ORAL | Status: DC

## 2012-12-28 NOTE — Telephone Encounter (Signed)
Left message on VM informing patient of insurance company's response. Patient to call to discuss

## 2012-12-28 NOTE — Telephone Encounter (Signed)
Spoke with patient, patient verbalized understanding of response from insurance company and Dr.Hopper's reply. Patient ok to have med change, rx to be sent with note Hold until patient request    I called the pharmacy and informed them to hold until patient request

## 2012-12-28 NOTE — Progress Notes (Signed)
Subjective:    Patient ID: Nicholas Bonilla, male    DOB: December 10, 1961, 52 y.o.   MRN: [id number]  HPI  Nicholas Bonilla is here for a physical; acute issues include recent colitis flare for which steroids Rxed      Review of Systems CHRONIC HYPERTENSION: Disease Monitoring  Blood pressure range: 124/78-168/90  Chest pain: no   Dyspnea: no  Claudication: no             Palpitations:while off Bystolic due to insurance pre Authorization  issues  Medication compliance: usually, dose increased due to BP rise with steroids  Medication Side Effects  Lightheadedness: no  Urinary frequency:no  Edema: no   Preventitive Healthcare:  Exercise:no    Diet Pattern: no plan  Salt Restriction: yes       Objective:   Physical Exam Gen.: Healthy and well-nourished in appearance. Alert, appropriate and cooperative throughout exam.   Head: Normocephalic without obvious abnormalities;  Moustache . Pattern alopecia  Eyes: No corneal or conjunctival inflammation noted. Pupils equal round reactive to light and accommodation.  Extraocular motion intact. Vision grossly normal with lenses Ears: External  ear exam reveals no significant lesions or deformities. Canals clear .TMs normal. Hearing is grossly normal bilaterally. Nose: External nasal exam reveals no deformity or inflammation. L nasal mucosa erythematous. No lesions or exudates noted.  Mouth: Oral mucosa and oropharynx reveal no lesions or exudates. Teeth in good repair. Neck: No deformities, masses, or tenderness noted. Range of motion & Thyroid normal. Lungs: Normal respiratory effort; chest expands symmetrically. Lungs are clear to auscultation without rales, wheezes, or increased work of breathing. Heart: Normal rate and rhythm. Normal S1 and S2. No gallop, click, or rub. Grade 1/6 systolic murmur @ apex. Abdomen: Bowel sounds normal; abdomen soft and nontender. No masses, organomegaly or hernias noted. Genitalia: Genitalia normal except for left  varices. Prostate is normal without enlargement, asymmetry, nodularity, or induration except for fibrous septum between lobes.                                    Musculoskeletal/extremities: Accentuated curvature of upper thoracic  spine. There is some asymmetry of the posterior thoracic musculature suggesting occult scoliosis. No clubbing, cyanosis, edema, or significant extremity  deformity noted. Range of motion normal .Tone & strength  normal.Joints normal. Nail health good. Able to lie down & sit up w/o help. Negative SLR bilaterally Vascular: Carotid, radial artery, dorsalis pedis and  posterior tibial pulses are full and equal. No bruits present. Neurologic: Alert and oriented x3. Deep tendon reflexes symmetrical and normal.  Skin: Intact without suspicious lesions or rashes. Lymph: No cervical, axillary, or inguinal lymphadenopathy present. Psych: Mood and affect are normal. Normally interactive                                                                                         Assessment & Plan:  #1 comprehensive physical exam; no acute findings #2 significant osteoporosis is present; it is recommended that Dr. Mechele Collin work with an Endocrinologist @ [facility] to prevent progression. Perhaps he  is a candidate for Reclast or Prolia; with his gastroneurology history oral bisphosphonates may not be the optimal choice. Plan: see Orders

## 2012-12-28 NOTE — Patient Instructions (Addendum)
EKG is normal but there are MINOR ST-T changes of early repolarization. These are normal variants but could be mistaken for acute injury. This EKG should be available for comparison if  seen emergently.  Please  schedule fasting Labs : BMET,Lipids, hepatic panel, CBC & dif, TSH, PSA. PLEASE BRING THESE INSTRUCTIONS TO FOLLOW UP  LAB APPOINTMENT.This will guarantee correct labs are drawn, eliminating need for repeat blood sampling ( needle sticks ! ). Diagnoses /Codes: V70.0  If you activate My Chart; the results can be released to you as soon as they populate from the lab. If you choose not to use this program; the labs have to be reviewed, copied & mailed causing a delay in getting the results to you.  Review and correct the record as indicated. Please share record with all medical staff seen.

## 2012-12-28 NOTE — Telephone Encounter (Signed)
Notice received indicating Request for coverage of Bystolic is denied. Preferred alternatives include atenolol or metoprolol

## 2012-12-28 NOTE — Telephone Encounter (Signed)
His insurance company is denying preauthorization documentation for [employer]. His insurance company  requires he use their alternative choice . The most effective, least expensive alternative on his plan appears to be Metoprolol 25 mg bid ( I am on this )

## 2012-12-29 ENCOUNTER — Encounter: Payer: Self-pay | Admitting: Internal Medicine

## 2012-12-30 ENCOUNTER — Encounter: Payer: Self-pay | Admitting: Internal Medicine

## 2012-12-30 ENCOUNTER — Other Ambulatory Visit: Payer: Self-pay | Admitting: Internal Medicine

## 2012-12-30 ENCOUNTER — Other Ambulatory Visit (INDEPENDENT_AMBULATORY_CARE_PROVIDER_SITE_OTHER): Payer: BC Managed Care – PPO

## 2012-12-30 DIAGNOSIS — E785 Hyperlipidemia, unspecified: Secondary | ICD-10-CM

## 2012-12-30 DIAGNOSIS — I10 Essential (primary) hypertension: Secondary | ICD-10-CM

## 2012-12-30 DIAGNOSIS — Z0000 Encounter for general adult medical examination without abnormal findings: Secondary | ICD-10-CM

## 2012-12-30 LAB — CBC WITH DIFFERENTIAL/PLATELET
Basophils Relative: 0.1 % (ref 0.0–3.0)
Eosinophils Relative: 1.1 % (ref 0.0–5.0)
HCT: 45.7 % (ref 39.0–52.0)
Lymphs Abs: 3.6 10*3/uL (ref 0.7–4.0)
MCV: 90.6 fl (ref 78.0–100.0)
Monocytes Absolute: 0.6 10*3/uL (ref 0.1–1.0)
Neutro Abs: 4.4 10*3/uL (ref 1.4–7.7)
Platelets: 181 10*3/uL (ref 150.0–400.0)
RBC: 5.04 Mil/uL (ref 4.22–5.81)
WBC: 8.7 10*3/uL (ref 4.5–10.5)

## 2012-12-30 LAB — BASIC METABOLIC PANEL
CO2: 30 mEq/L (ref 19–32)
Chloride: 107 mEq/L (ref 96–112)
Potassium: 4.1 mEq/L (ref 3.5–5.1)
Sodium: 142 mEq/L (ref 135–145)

## 2012-12-30 LAB — HEPATIC FUNCTION PANEL
AST: 23 U/L (ref 0–37)
Total Bilirubin: 0.6 mg/dL (ref 0.3–1.2)

## 2012-12-30 LAB — LIPID PANEL
Total CHOL/HDL Ratio: 4
Triglycerides: 121 mg/dL (ref 0.0–149.0)

## 2012-12-30 LAB — PSA: PSA: 1.71 ng/mL (ref 0.10–4.00)

## 2012-12-30 LAB — TSH: TSH: 1.53 u[IU]/mL (ref 0.35–5.50)

## 2013-02-11 ENCOUNTER — Ambulatory Visit: Payer: Self-pay | Admitting: Internal Medicine

## 2013-02-14 ENCOUNTER — Telehealth: Payer: Self-pay | Admitting: Internal Medicine

## 2013-02-14 NOTE — Telephone Encounter (Signed)
Patient Information:  Caller Name: [place]  Phone: [phone]  Patient: Nicholas Bonilla, Nicholas Bonilla  Gender: Male  DOB: Apr 25, 1961  Age: 52 Years  PCP: Marga Melnick  Office Follow Up:  Does the office need to follow up with this patient?: No  Instructions For The Office: N/A  RN Note:  pt agrees to follow home care instructions  Symptoms  Reason For Call & Symptoms: Onset 3/2 had vomiting and diarrhea.  lasted about 24 hours.  Now has general body aches, low grade fever, fatigue.  Reviewed Health History In EMR: Yes  Reviewed Medications In EMR: Yes  Reviewed Allergies In EMR: Yes  Reviewed Surgeries / Procedures: Yes  Date of Onset of Symptoms: 02/12/2013  Any Fever: Yes  Fever Taken: Oral  Fever Time Of Reading: 13:10:00  Fever Last Reading: 99.9  Guideline(s) Used:  Influenza - Seasonal  Colds  Disposition Per Guideline:   Home Care  Reason For Disposition Reached:   Colds with no complications  Advice Given:  Treatment for Associated Symptoms of Colds:  For muscle aches, headaches, or moderate fever (more than 101 F or 38.9 C): Take acetaminophen every 4 hours.  Hydrate: Drink adequate liquids.  Humidifier:  If the air in your home is dry, use a cool-mist humidifier  Expected Course:   Fever may last 2-3 days  Call Back If:  Difficulty breathing occurs  Fever lasts more than 3 days  Nasal discharge lasts more than 10 days  Cough lasts more than 3 weeks  You become worse  Medicines for a Stuffy or Runny Nose:  Most cold medicines that are available over-the-counter (OTC) are not helpful.

## 2013-02-14 NOTE — Telephone Encounter (Signed)
Noted, home care instructions given

## 2013-02-16 ENCOUNTER — Inpatient Hospital Stay: Payer: Self-pay | Admitting: Family Medicine

## 2013-02-16 LAB — BASIC METABOLIC PANEL
Anion Gap: 4 — ABNORMAL LOW (ref 7–16)
BUN: 17 mg/dL (ref 7–18)
Calcium, Total: 8.8 mg/dL (ref 8.5–10.1)
Co2: 29 mmol/L (ref 21–32)
EGFR (African American): >60
EGFR (Non-African Amer.): >60
Sodium: 137 mmol/L (ref 136–145)

## 2013-02-16 LAB — PROTIME-INR
INR: 0.9
Prothrombin Time: 12.6 secs (ref 11.5–14.7)

## 2013-02-16 LAB — CBC
HGB: 16.8 g/dL (ref 13.0–18.0)
MCH: 31.7 pg (ref 26.0–34.0)
MCHC: 34.9 g/dL (ref 32.0–36.0)
MCV: 91 fL (ref 80–100)
Platelet: 185 10*3/uL (ref 150–440)
RBC: 5.29 10*6/uL (ref 4.40–5.90)
RDW: 13.7 % (ref 11.5–14.5)
WBC: 7.7 10*3/uL (ref 3.8–10.6)

## 2013-02-17 LAB — CBC WITH DIFFERENTIAL/PLATELET
Basophil #: 0.1 10*3/uL (ref 0.0–0.1)
Eosinophil %: 2.2 %
HCT: 43.8 % (ref 40.0–52.0)
Lymphocyte #: 2.9 10*3/uL (ref 1.0–3.6)
Lymphocyte %: 42.8 %
MCHC: 35 g/dL (ref 32.0–36.0)
MCV: 90 fL (ref 80–100)
RBC: 4.87 10*6/uL (ref 4.40–5.90)
RDW: 13.3 % (ref 11.5–14.5)
WBC: 6.7 10*3/uL (ref 3.8–10.6)

## 2013-02-18 LAB — CBC WITH DIFFERENTIAL/PLATELET
Eosinophil %: 1 %
HCT: 46.7 % (ref 40.0–52.0)
HGB: 16.4 g/dL (ref 13.0–18.0)
Lymphocyte #: 3.2 10*3/uL (ref 1.0–3.6)
MCH: 31.5 pg (ref 26.0–34.0)
Neutrophil %: 53 %
Platelet: 200 10*3/uL (ref 150–440)
RBC: 5.21 10*6/uL (ref 4.40–5.90)
RDW: 13.4 % (ref 11.5–14.5)

## 2013-02-18 LAB — PROTIME-INR
INR: 0.9
Prothrombin Time: 12.6 secs (ref 11.5–14.7)

## 2013-02-18 LAB — APTT: Activated PTT: 84.6 secs — ABNORMAL HIGH (ref 23.6–35.9)

## 2013-02-19 LAB — APTT: Activated PTT: 84.3 secs — ABNORMAL HIGH (ref 23.6–35.9)

## 2013-02-19 LAB — PROTIME-INR
INR: 1
Prothrombin Time: 13.4 secs (ref 11.5–14.7)

## 2013-02-20 LAB — PROTIME-INR: INR: 1.3

## 2013-02-20 LAB — PLATELET COUNT: Platelet: 198 10*3/uL (ref 150–440)

## 2013-02-21 LAB — APTT: Activated PTT: 129.1 secs — ABNORMAL HIGH (ref 23.6–35.9)

## 2013-02-23 ENCOUNTER — Encounter: Payer: Self-pay | Admitting: Internal Medicine

## 2013-02-23 ENCOUNTER — Ambulatory Visit (INDEPENDENT_AMBULATORY_CARE_PROVIDER_SITE_OTHER): Payer: BC Managed Care – PPO | Admitting: Internal Medicine

## 2013-02-23 ENCOUNTER — Telehealth: Payer: Self-pay | Admitting: Internal Medicine

## 2013-02-23 VITALS — BP 114/76 | HR 68 | Wt 206.8 lb

## 2013-02-23 DIAGNOSIS — I82409 Acute embolism and thrombosis of unspecified deep veins of unspecified lower extremity: Secondary | ICD-10-CM

## 2013-02-23 DIAGNOSIS — I82402 Acute embolism and thrombosis of unspecified deep veins of left lower extremity: Secondary | ICD-10-CM

## 2013-02-23 NOTE — Progress Notes (Signed)
  Subjective:    Patient ID: Nicholas Bonilla, male    DOB: Mar 06, 1961, 52 y.o.   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 Continues to complain with warmth behind left knee. Since discharge patient has been taking Coumadin 10 mg daily.  He questions whether weaning oral steroids may have been a factor in the initial episode in 1996. He also had  been traveling extensively at that time.  This episode was in the context of viral gastroenteritis which had been down for over 2 days.  There is no personal or family history of coagulopathy. He has an appointment with the Hematologist next week.    Review of Systems  Since discharge he denies chest pain, palpitations, dyspnea, hemoptysis, or or residual extremity swelling or pain..    Objective:   Physical Exam Appears healthy and well-nourished & in no acute distress  No carotid bruits are present.  Heart rhythm and rate are normal with no significant murmurs or gallops.  Chest is clear with no increased work of breathing  There is no evidence of aortic aneurysm or renal artery bruits  Abdomen soft with no organomegaly or masses.  No clubbing, cyanosis or edema present. Homan's negative  Pedal pulses are intact   No ischemic skin changes are present .    Alert and oriented. Strength, tone, DTRs reflexes normal          Assessment & Plan:

## 2013-02-23 NOTE — Assessment & Plan Note (Signed)
Hematology consultation next week.  PT/INR is 2.9 on 10 mg of warfarin daily. Dose will be decreased to 5 mg daily with repeat PT/INR in one week.

## 2013-02-23 NOTE — Patient Instructions (Addendum)
PT/INR is therapeutic @ 2.9. Change warfarin dose to 5 mg daily ; repeat PT/INR in 1 week. Review and correct the record as indicated. Please share record with all medical staff seen.

## 2013-02-25 ENCOUNTER — Other Ambulatory Visit: Payer: Self-pay | Admitting: Unknown Physician Specialty

## 2013-02-25 LAB — PROTIME-INR: Prothrombin Time: 21 secs — ABNORMAL HIGH (ref 11.5–14.7)

## 2013-02-26 ENCOUNTER — Ambulatory Visit: Payer: Self-pay | Admitting: Unknown Physician Specialty

## 2013-02-26 LAB — PROTIME-INR: INR: 1.5

## 2013-02-27 ENCOUNTER — Emergency Department: Payer: Self-pay | Admitting: Emergency Medicine

## 2013-02-27 LAB — COMPREHENSIVE METABOLIC PANEL
Alkaline Phosphatase: 54 U/L (ref 50–136)
Anion Gap: 5 — ABNORMAL LOW (ref 7–16)
BUN: 19 mg/dL — ABNORMAL HIGH (ref 7–18)
Calcium, Total: 8.6 mg/dL (ref 8.5–10.1)
Co2: 27 mmol/L (ref 21–32)
Creatinine: 0.89 mg/dL (ref 0.60–1.30)
Glucose: 98 mg/dL (ref 65–99)
Potassium: 4 mmol/L (ref 3.5–5.1)
SGOT(AST): 26 U/L (ref 15–37)
SGPT (ALT): 62 U/L (ref 12–78)
Sodium: 140 mmol/L (ref 136–145)

## 2013-02-27 LAB — CBC
HCT: 42.7 % (ref 40.0–52.0)
HGB: 14.3 g/dL (ref 13.0–18.0)
MCHC: 33.5 g/dL (ref 32.0–36.0)
MCV: 90 fL (ref 80–100)
Platelet: 200 10*3/uL (ref 150–440)
RBC: 4.76 10*6/uL (ref 4.40–5.90)

## 2013-02-27 LAB — PROTIME-INR
INR: 1.6
Prothrombin Time: 19.1 secs — ABNORMAL HIGH (ref 11.5–14.7)

## 2013-02-28 ENCOUNTER — Ambulatory Visit: Payer: Self-pay | Admitting: Internal Medicine

## 2013-02-28 LAB — PROTIME-INR: Prothrombin Time: 20 secs — ABNORMAL HIGH (ref 11.5–14.7)

## 2013-03-01 LAB — CEA: CEA: 0.9 ng/mL (ref 0.0–4.7)

## 2013-03-01 LAB — CANCER ANTIGEN 19-9: CA 19-9: <1 U/mL (ref 0–35)

## 2013-03-03 LAB — PROTIME-INR: Prothrombin Time: 27.3 secs — ABNORMAL HIGH (ref 11.5–14.7)

## 2013-03-06 LAB — PROTIME-INR
INR: 2
Prothrombin Time: 22.2 secs — ABNORMAL HIGH (ref 11.5–14.7)

## 2013-03-09 ENCOUNTER — Telehealth: Payer: Self-pay | Admitting: Internal Medicine

## 2013-03-09 NOTE — Telephone Encounter (Signed)
Patient Information:  Caller Name: Feliz  Phone: [phone]  Patient: Nicholas, Bonilla  Gender: Male  DOB: [birth year]  Age: 52 Years  PCP: Marga Melnick  Office Follow Up:  Does the office need to follow up with this patient?: Yes  Instructions For The Office: See note; asking if BP med needs adjustment or if needs to be seen.  RN Note:  Called regarding hypertension; asking if MD needs to increase Metoprolol.  Taking it BID.  Last office visit 02/23/13. On Coumadin, not taking ASA. Please call back to advise if needs to be seen again or if MD feels medication adjustment is needed. [facility].  Symptoms  Reason For Call & Symptoms: Elevated blood pressure.  Reports BP been running 152-160/ 80-90.  BP today 150/80 right arm approximately 0650. Noted recent ruptured blood vessels in both eye; last occurred in one eye in last 2 days.  Reviewed Health History In EMR: Yes  Reviewed Medications In EMR: Yes  Reviewed Allergies In EMR: Yes  Reviewed Surgeries / Procedures: Yes  Date of Onset of Symptoms: 03/06/2013  Guideline(s) Used:  High Blood Pressure  Disposition Per Guideline:   See Within 2 Weeks in Office  Reason For Disposition Reached:   BP > 140/90 and is taking BP medications  Advice Given:  Lifestyle Changes  Maintain a healthy weight. Lose weight if you are overweight.  Do 30 minutes of aerobic physical activity (e.g., brisk walking) most days of the week.  Eat a diet high in fresh fruits and low-fat dairy products. Limit your intake of saturated and total fat. Choose foods that are lower in salt.  Call Back If:  Headache, blurred vision, difficulty talking, or difficulty walking occurs  Chest pain or difficulty breathing occurs  You want to go in to the office for a blood pressure check  You become worse.  Patient Will Follow Care Advice:  YES

## 2013-03-10 NOTE — Telephone Encounter (Signed)
Per Dr.Hopper: Metoprolol 25 mg 1 1/2 BID, Continue to monitor BP and access readings. Patient verbalized understanding. Medication list updated to reflect med change

## 2013-03-13 LAB — PROTIME-INR
INR: 2.2
Prothrombin Time: 24.3 secs — ABNORMAL HIGH (ref 11.5–14.7)

## 2013-03-14 ENCOUNTER — Ambulatory Visit: Payer: Self-pay | Admitting: Internal Medicine

## 2013-03-17 LAB — PROTIME-INR
INR: 2.1
Prothrombin Time: 23.3 secs — ABNORMAL HIGH (ref 11.5–14.7)

## 2013-03-23 LAB — PROTIME-INR
INR: 2
Prothrombin Time: 22 secs — ABNORMAL HIGH (ref 11.5–14.7)

## 2013-03-27 LAB — PROTIME-INR: Prothrombin Time: 22.2 secs — ABNORMAL HIGH (ref 11.5–14.7)

## 2013-04-03 LAB — PROTIME-INR
INR: 1.8
Prothrombin Time: 20.9 secs — ABNORMAL HIGH (ref 11.5–14.7)

## 2013-04-10 LAB — PROTIME-INR: INR: 1.9

## 2013-04-13 ENCOUNTER — Ambulatory Visit: Payer: Self-pay | Admitting: Internal Medicine

## 2013-04-17 LAB — PROTIME-INR
INR: 2.2
Prothrombin Time: 23.7 secs — ABNORMAL HIGH (ref 11.5–14.7)

## 2013-04-28 LAB — PROTIME-INR: INR: 2.4

## 2013-05-14 ENCOUNTER — Ambulatory Visit: Payer: Self-pay | Admitting: Internal Medicine

## 2013-05-16 LAB — PROTIME-INR
INR: 1.8
Prothrombin Time: 20.8 secs — ABNORMAL HIGH (ref 11.5–14.7)

## 2013-06-13 ENCOUNTER — Ambulatory Visit: Payer: Self-pay | Admitting: Internal Medicine

## 2013-06-15 ENCOUNTER — Telehealth: Payer: Self-pay | Admitting: *Deleted

## 2013-06-15 MED ORDER — METOPROLOL TARTRATE 25 MG PO TABS: ORAL_TABLET | ORAL | Status: DC

## 2013-06-15 NOTE — Telephone Encounter (Signed)
Refill done.  

## 2013-06-20 LAB — PROTIME-INR
INR: 2.1
Prothrombin Time: 22.9 secs — ABNORMAL HIGH (ref 11.5–14.7)

## 2013-07-14 ENCOUNTER — Ambulatory Visit: Payer: Self-pay | Admitting: Internal Medicine

## 2013-07-18 LAB — PROTIME-INR
INR: 1.6
Prothrombin Time: 18.8 secs — ABNORMAL HIGH (ref 11.5–14.7)

## 2013-08-14 ENCOUNTER — Ambulatory Visit: Payer: Self-pay | Admitting: Internal Medicine

## 2013-08-22 LAB — PROTIME-INR
INR: 1.6
Prothrombin Time: 19.2 secs — ABNORMAL HIGH (ref 11.5–14.7)

## 2013-09-13 ENCOUNTER — Other Ambulatory Visit: Payer: Self-pay | Admitting: *Deleted

## 2013-09-13 ENCOUNTER — Ambulatory Visit: Payer: Self-pay | Admitting: Internal Medicine

## 2013-09-13 LAB — PROTIME-INR
INR: 1.8
Prothrombin Time: 20.3 secs — ABNORMAL HIGH (ref 11.5–14.7)

## 2013-09-13 MED ORDER — METOPROLOL TARTRATE 25 MG PO TABS: ORAL_TABLET | ORAL | Status: DC

## 2013-09-13 NOTE — Telephone Encounter (Signed)
Refill for Metoprolol sent to [facility]

## 2013-09-20 ENCOUNTER — Ambulatory Visit: Payer: BC Managed Care – PPO | Admitting: Internal Medicine

## 2013-09-26 ENCOUNTER — Ambulatory Visit (INDEPENDENT_AMBULATORY_CARE_PROVIDER_SITE_OTHER): Payer: BC Managed Care – PPO | Admitting: Internal Medicine

## 2013-09-26 ENCOUNTER — Encounter: Payer: Self-pay | Admitting: Internal Medicine

## 2013-09-26 VITALS — BP 123/75 | HR 65 | Temp 98.8°F | Wt 222.6 lb

## 2013-09-26 DIAGNOSIS — D6851 Activated protein C resistance: Secondary | ICD-10-CM | POA: Insufficient documentation

## 2013-09-26 DIAGNOSIS — E782 Mixed hyperlipidemia: Secondary | ICD-10-CM

## 2013-09-26 DIAGNOSIS — I10 Essential (primary) hypertension: Secondary | ICD-10-CM

## 2013-09-26 DIAGNOSIS — D682 Hereditary deficiency of other clotting factors: Secondary | ICD-10-CM

## 2013-09-26 LAB — PROTIME-INR: Prothrombin Time: 21.7 secs — ABNORMAL HIGH (ref 11.5–14.7)

## 2013-09-26 MED ORDER — AMLODIPINE BESYLATE 5 MG PO TABS: 5.0000 mg | ORAL_TABLET | Freq: Every day | ORAL | Status: DC

## 2013-09-26 MED ORDER — ATORVASTATIN CALCIUM 10 MG PO TABS: 10.0000 mg | ORAL_TABLET | Freq: Every day | ORAL | Status: DC

## 2013-09-26 MED ORDER — METOPROLOL TARTRATE 50 MG PO TABS: 50.0000 mg | ORAL_TABLET | Freq: Two times a day (BID) | ORAL | Status: DC

## 2013-09-26 NOTE — Patient Instructions (Signed)
Metoprolol be changed to 50 mg twice a day and amlodipine 5 mg daily added. Atorvastatin 10 mg Monday, Wednesday, Friday, and Sunday will be started. Please  schedule fasting Labs in 10-11 weeks after medication changes: BMET,Lipids, hepatic panel,CK. PLEASE BRING THESE INSTRUCTIONS TO FOLLOW UP  LAB APPOINTMENT.This will guarantee correct labs are drawn, eliminating need for repeat blood sampling ( needle sticks ! ). Diagnoses /Codes: 401.9;272.4,V17.3,995.20 . Minimal Blood Pressure Goal= AVERAGE < 140/90;  Ideal is an AVERAGE < 135/85. This AVERAGE should be calculated from @ least 5-7 BP readings taken @ different times of day on different days of week. You should not respond to isolated BP readings , but rather the AVERAGE for that week .Please bring your  blood pressure cuff to office visits to verify that it is reliable.It  can also be checked against the blood pressure device at the pharmacy. Finger or wrist cuffs are not dependable; an arm cuff is.

## 2013-09-26 NOTE — Assessment & Plan Note (Signed)
Metoprolol 50 mg bid Amlodipine 5 mg daily

## 2013-09-26 NOTE — Progress Notes (Signed)
  Subjective:    Patient ID: Nicholas Bonilla, male    DOB: 1961/11/04, 52 y.o.   MRN: [id number]  HPI  Because of persistently elevated blood pressures (150s/85) his metoprolol 25 mg bid  was increased to one and one half pills twice a day in May. There was no significant response so he increased it to 50 mg twice a day 2-3 weeks ago. At another physician's office visit last week his blood pressure was still elevated; that MD recommend he take 75 mg in the morning and 50 mg in the evening. This is the only blood pressure medication he is taking.  His blood pressures recently have ranged 140-150/85-95.    Review of Systems He denies significant headaches, epistaxis, chest pain, palpitations, exertional dyspnea, claudication, paroxysmal nocturnal dyspnea, or edema. He is on a modified heart healthy diet; he does not add salt @ table. He exercises 30-45 minutes 2-3 times per week as CVE without symptoms.   Family history is positive for premature coronary disease in his father. Advanced cholesterol testing reveals his LDL goal is less than 100, ideally < 70. No statin to date.  Significant abdominal symptoms; his colitis has resolved. He denies significant memory deficit or myalgias denied.     Objective:   Physical Exam Appears healthy and well-nourished & in no acute distress  No carotid bruits are present.No neck pain distention present at 10 - 15 degrees. Thyroid normal to palpation  Heart rhythm and rate are normal with no significant murmurs or gallops. S4  Chest is clear with no increased work of breathing  There is no evidence of aortic aneurysm or renal artery bruits  Abdomen soft with no organomegaly or masses. No HJR  No clubbing, cyanosis or edema present.  Pedal pulses are intact   No ischemic skin changes are present . Nails healthy    Alert and oriented. Strength, tone, DTRs reflexes normal           Assessment & Plan:  See Current Assessment & Plan in Problem  List under specific Diagnosis

## 2013-09-26 NOTE — Assessment & Plan Note (Signed)
Recommend Atorvastatin 10 mg M,W,F ,&Sun

## 2013-10-03 LAB — PROTIME-INR
INR: 1.8
Prothrombin Time: 20.9 secs — ABNORMAL HIGH (ref 11.5–14.7)

## 2013-10-09 ENCOUNTER — Encounter: Payer: Self-pay | Admitting: Cardiology

## 2013-10-09 ENCOUNTER — Encounter: Payer: Self-pay | Admitting: Internal Medicine

## 2013-10-11 LAB — PROTIME-INR: INR: 2.3

## 2013-10-14 ENCOUNTER — Ambulatory Visit: Payer: Self-pay | Admitting: Internal Medicine

## 2013-10-17 LAB — PROTIME-INR: Prothrombin Time: 27.4 secs — ABNORMAL HIGH (ref 11.5–14.7)

## 2013-10-19 ENCOUNTER — Other Ambulatory Visit: Payer: Self-pay

## 2013-10-25 LAB — PROTIME-INR: INR: 2.9

## 2013-11-06 LAB — PROTIME-INR: INR: 3.4

## 2013-11-13 ENCOUNTER — Ambulatory Visit: Payer: Self-pay | Admitting: Internal Medicine

## 2013-12-05 ENCOUNTER — Ambulatory Visit (INDEPENDENT_AMBULATORY_CARE_PROVIDER_SITE_OTHER): Payer: BC Managed Care – PPO | Admitting: Internal Medicine

## 2013-12-05 ENCOUNTER — Telehealth: Payer: Self-pay | Admitting: *Deleted

## 2013-12-05 ENCOUNTER — Encounter: Payer: Self-pay | Admitting: Internal Medicine

## 2013-12-05 VITALS — BP 139/77 | HR 62 | Temp 98.8°F | Resp 16 | Wt 211.0 lb

## 2013-12-05 DIAGNOSIS — I82409 Acute embolism and thrombosis of unspecified deep veins of unspecified lower extremity: Secondary | ICD-10-CM

## 2013-12-05 DIAGNOSIS — L02219 Cutaneous abscess of trunk, unspecified: Secondary | ICD-10-CM

## 2013-12-05 DIAGNOSIS — D682 Hereditary deficiency of other clotting factors: Secondary | ICD-10-CM

## 2013-12-05 MED ORDER — AMOXICILLIN-POT CLAVULANATE 500-125 MG PO TABS: 1.0000 | ORAL_TABLET | Freq: Three times a day (TID) | ORAL | Status: DC

## 2013-12-05 MED ORDER — MUPIROCIN 2 % EX OINT: TOPICAL_OINTMENT | CUTANEOUS | Status: DC

## 2013-12-05 NOTE — Progress Notes (Signed)
   Subjective:    Patient ID: Nicholas Bonilla, male    DOB: 07-20-1961, 52 y.o.   MRN: [id number]  HPI   While bathing 12/04/13 he noticed a tender area at the right flank.It was observed to be a red lesion resembling "a blood blister". He did squeeze it but there was no purulence or other secretions from the lesion  It has gotten slightly more red and larger since this occurred.  He knows of no definite vector. Over the weekend he had been working on his roof as well as doing some Database administrator.  He is also due for PT/INR check as per his Hematologist.  He has no past medical history of .MRSA    Review of Systems  He has no fever, chills, sweats, or associated weight loss. He has no other lesions or rashes of concern.     Objective:   Physical Exam Gen.: Healthy and well-nourished in appearance. Alert, appropriate and cooperative throughout exam.  Eyes: No corneal or conjunctival inflammation noted.  Small area of hemorrhage in the right lateral sclera. Lungs: Normal respiratory effort; chest expands symmetrically. Lungs are clear to auscultation without rales, wheezes, or increased work of breathing. Heart: Normal rate and rhythm. Normal S1 and S2. No gallop, click, or rub. Grade 1/2-1 systolic murmur left sternal border Abdomen: Bowel sounds normal; abdomen soft and nontender. No masses, organomegaly or hernias noted.                                 Musculoskeletal/extremities: No clubbing, cyanosis, edema, or significant extremity  deformity noted. Range of motion normal .Tone & strength normal. Fingernail health good. Able to lie down & sit up w/o help.  Vascular: Carotid, radial artery, dorsalis pedis and  posterior tibial pulses are full and equal. No bruits present. Neurologic: Alert and oriented x3.       Skin: There is a erythematous 2.5 x 2.5 cm area of cellulitis at the right posterior lateral flank area. There is a central punctate deficit which appears to  have eschar formation. Superior and contiguous with this lesion is very faint erythema on 5 x 5 cm. Lymph: No cervical, axillary lymphadenopathy present. Psych: Mood and affect are normal. Normally interactive                                                                                         Assessment & Plan:  #1 cellulitis right posterior flank  #2 warfarin anticoagulation  Plan: See orders

## 2013-12-05 NOTE — Telephone Encounter (Signed)
Patient called and stated that he think he might have cellulitis or maybe some type of bug bite near his upper right hip. I informed patient that we did not have anything but I would let dr hopper know.

## 2013-12-05 NOTE — Patient Instructions (Addendum)
  Use an  ink  pen to outline the area of redness after showering each day. This should shrink in size each day. Report warning signs as discussed: Increasing pain, swelling, or red streaks emanating toward the body

## 2013-12-05 NOTE — Telephone Encounter (Signed)
Pt was seen in the office today by Dr. Hopper.//AB/CMA 

## 2013-12-05 NOTE — Telephone Encounter (Signed)
Can he come @ 12:45 today ? I'll work him in @ lunch

## 2013-12-26 ENCOUNTER — Ambulatory Visit: Payer: Self-pay | Admitting: Internal Medicine

## 2013-12-26 LAB — PROTIME-INR
INR: 1.9
PROTHROMBIN TIME: 21.5 s — AB (ref 11.5–14.7)

## 2014-01-02 LAB — PROTIME-INR
INR: 1.7
PROTHROMBIN TIME: 19.8 s — AB (ref 11.5–14.7)

## 2014-01-09 LAB — PROTIME-INR
INR: 2
PROTHROMBIN TIME: 22.1 s — AB (ref 11.5–14.7)

## 2014-01-14 ENCOUNTER — Ambulatory Visit: Payer: Self-pay | Admitting: Internal Medicine

## 2014-01-23 LAB — PROTIME-INR
INR: 2.7
Prothrombin Time: 27.9 secs — ABNORMAL HIGH (ref 11.5–14.7)

## 2014-02-06 LAB — PROTIME-INR
INR: 2.8
Prothrombin Time: 28.4 secs — ABNORMAL HIGH (ref 11.5–14.7)

## 2014-02-11 ENCOUNTER — Ambulatory Visit: Payer: Self-pay | Admitting: Internal Medicine

## 2014-02-13 IMAGING — US US EXTREM LOW VENOUS*L*
1 series · 14 of 24 positions shown · non-contrast
Comparison: none

REASON FOR EXAM: pain and swelling with a hx of DVT
COMMENTS:

PROCEDURE:     US  - US DOPPLER LOW EXTR LEFT  - February 16, 2013  [DATE]
RESULT:     Left lower extremity color flow duplex Doppler reveals deep
venous thrombosis present in the superficial femoral vein and popliteal
vein. Thrombus noted in the superficial veins of the calf.

[Series 1: us extrem low venous*left* · 0.09mm/px · 14 of 49 slices shown]
[im 1/49]
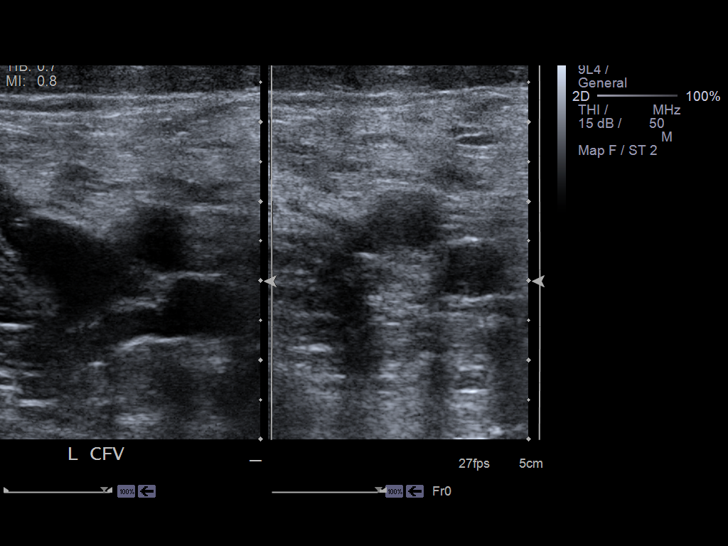
[im 5/49]
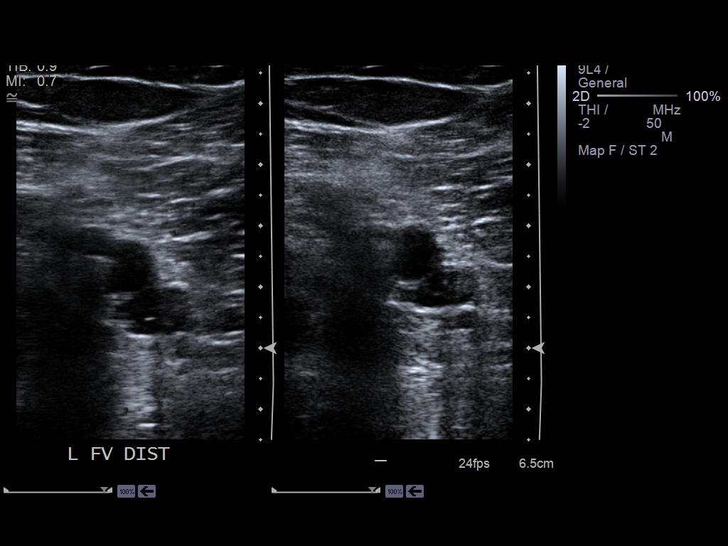
[im 9/49]
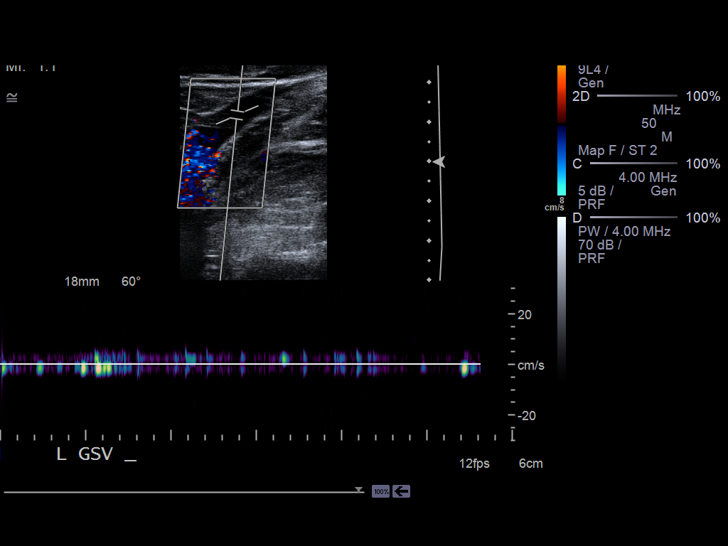
[im 13/49]
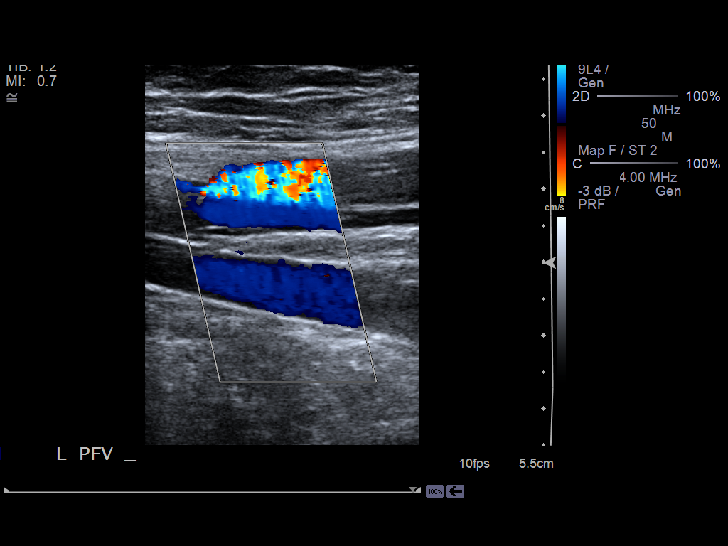
[im 15/49]
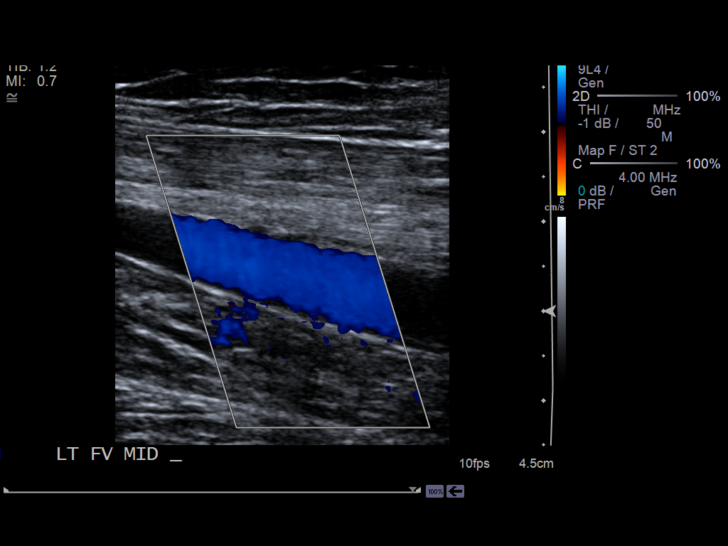
[im 19/49]
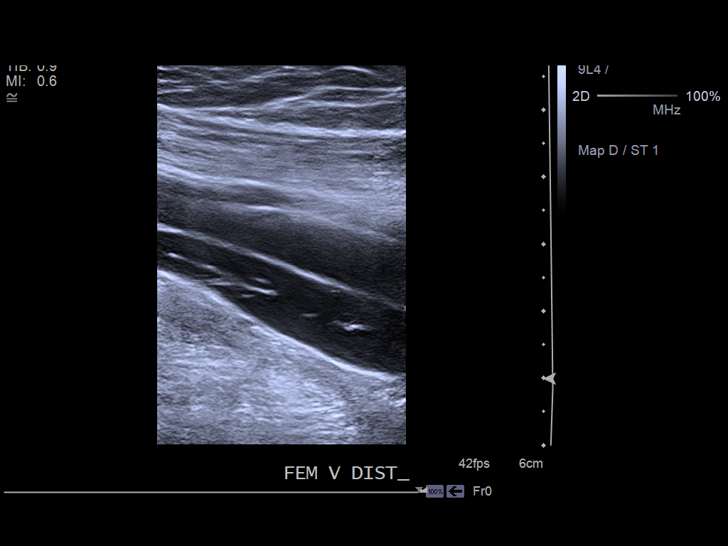
[im 23/49]
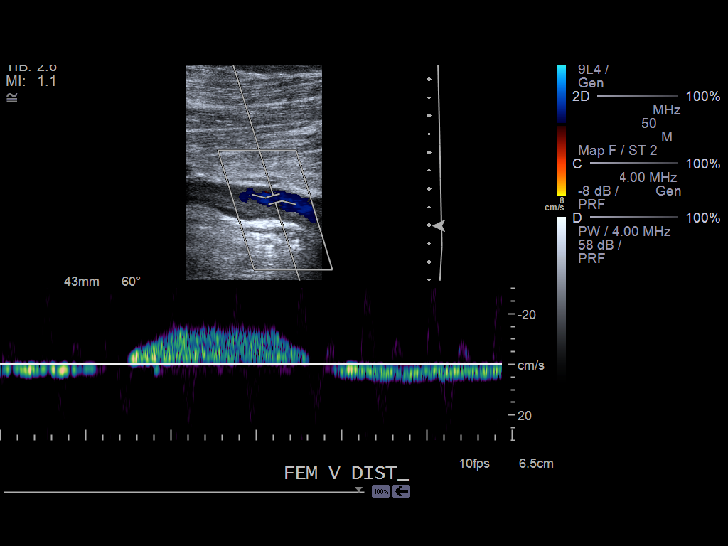
[im 26/49]
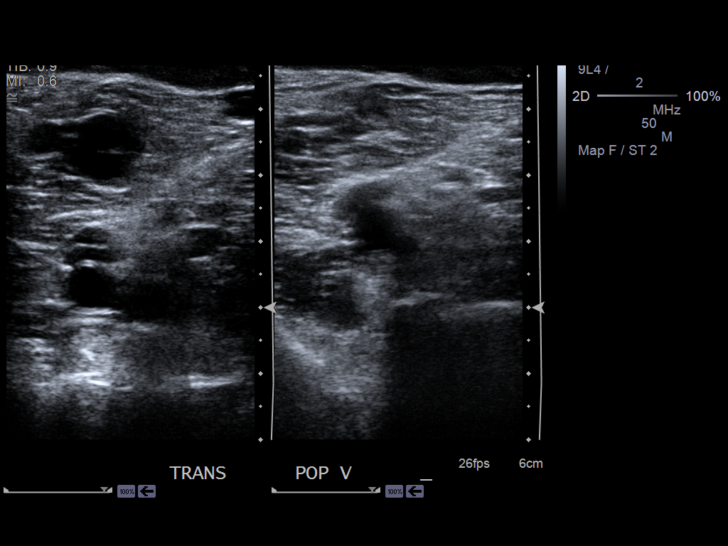
[im 30/49]
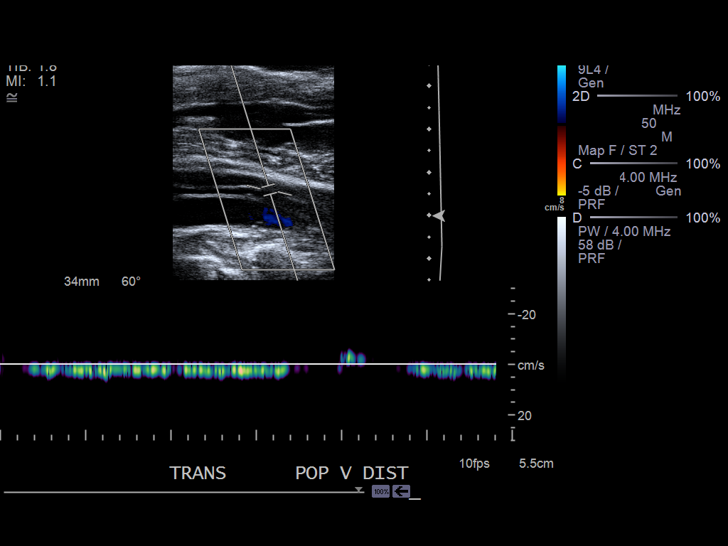
[im 34/49]
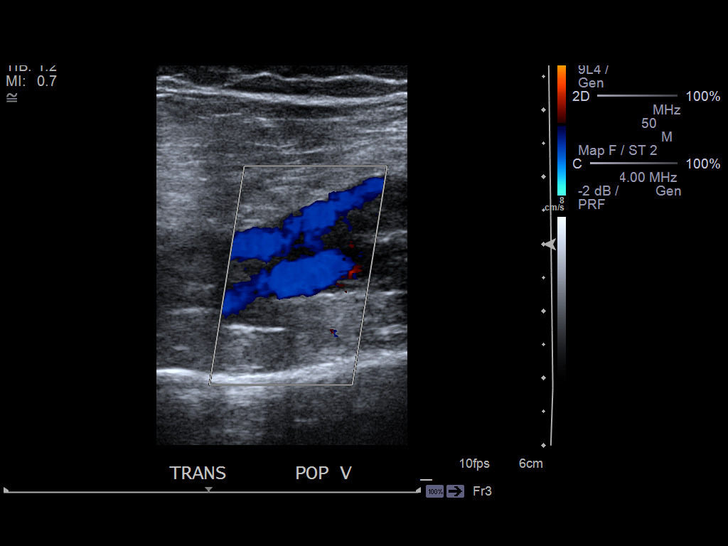
[im 38/49]
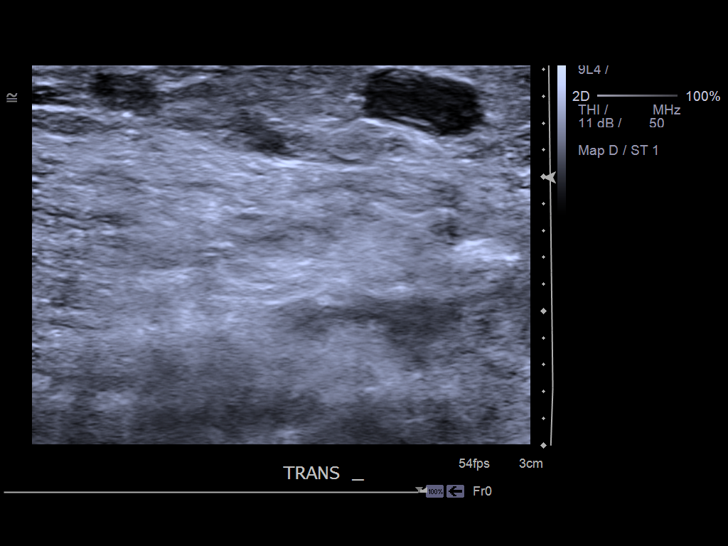
[im 40/49]
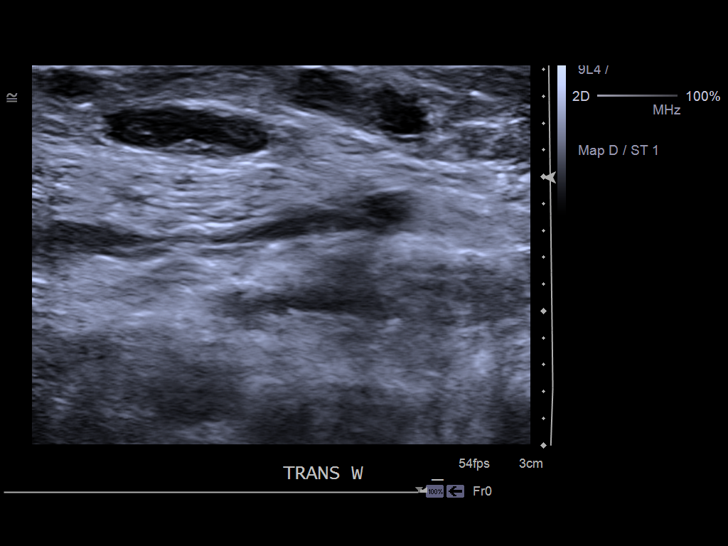
[im 44/49]
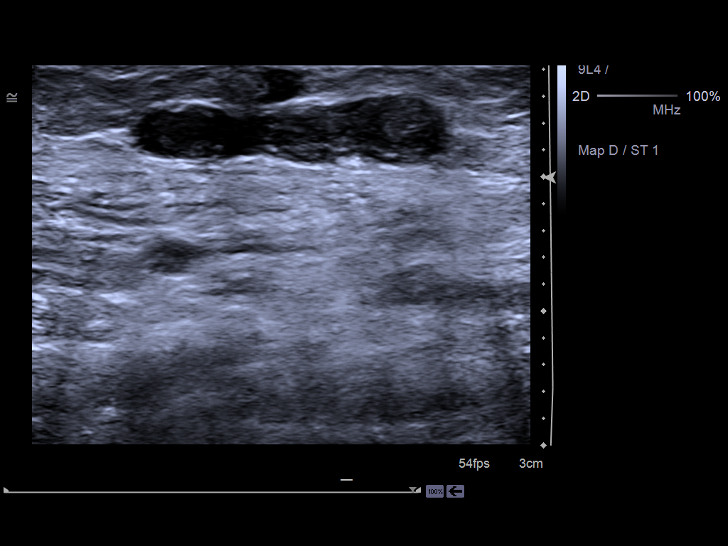
[im 49/49]
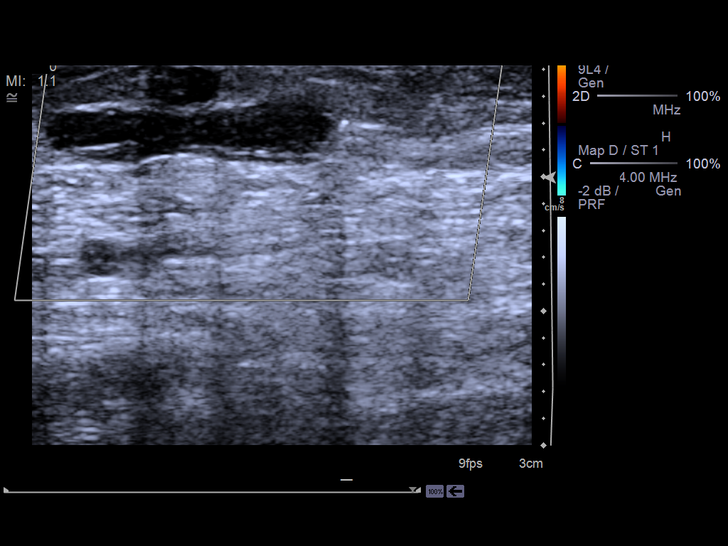

[14 of 24 positions shown; findings below may reference images not displayed]

IMPRESSION: Positive exam for deep venous thrombosis. Superficial calf
vein thrombosis also present.

## 2014-02-21 LAB — PROTIME-INR
INR: 2.5
Prothrombin Time: 26.4 secs — ABNORMAL HIGH (ref 11.5–14.7)

## 2014-03-14 ENCOUNTER — Ambulatory Visit: Payer: Self-pay | Admitting: Internal Medicine

## 2014-05-10 ENCOUNTER — Ambulatory Visit: Payer: Self-pay | Admitting: Internal Medicine

## 2014-05-10 LAB — D-DIMER(ARMC): D-Dimer: 431 ng/mL

## 2014-05-14 ENCOUNTER — Ambulatory Visit: Payer: Self-pay | Admitting: Internal Medicine

## 2014-08-07 ENCOUNTER — Other Ambulatory Visit: Payer: Self-pay | Admitting: Unknown Physician Specialty

## 2014-08-07 LAB — CBC WITH DIFFERENTIAL/PLATELET
Basophil #: 0.1 10*3/uL (ref 0.0–0.1)
Basophil %: 0.8 %
EOS ABS: 0.1 10*3/uL (ref 0.0–0.7)
Eosinophil %: 1.1 %
HCT: 48.3 % (ref 40.0–52.0)
HGB: 16.6 g/dL (ref 13.0–18.0)
Lymphocyte #: 1.8 10*3/uL (ref 1.0–3.6)
Lymphocyte %: 19.2 %
MCH: 31.1 pg (ref 26.0–34.0)
MCHC: 34.3 g/dL (ref 32.0–36.0)
MCV: 91 fL (ref 80–100)
MONO ABS: 0.5 x10 3/mm (ref 0.2–1.0)
Monocyte %: 5.5 %
NEUTROS PCT: 73.4 %
Neutrophil #: 6.8 10*3/uL — ABNORMAL HIGH (ref 1.4–6.5)
PLATELETS: 178 10*3/uL (ref 150–440)
RBC: 5.33 10*6/uL (ref 4.40–5.90)
RDW: 12.5 % (ref 11.5–14.5)
WBC: 9.2 10*3/uL (ref 3.8–10.6)

## 2014-08-07 LAB — COMPREHENSIVE METABOLIC PANEL
ALK PHOS: 58 U/L
ANION GAP: 9 (ref 7–16)
AST: 29 U/L (ref 15–37)
Albumin: 3.8 g/dL (ref 3.4–5.0)
BILIRUBIN TOTAL: 0.4 mg/dL (ref 0.2–1.0)
BUN: 21 mg/dL — AB (ref 7–18)
CHLORIDE: 107 mmol/L (ref 98–107)
CREATININE: 1.2 mg/dL (ref 0.60–1.30)
Calcium, Total: 9 mg/dL (ref 8.5–10.1)
Co2: 27 mmol/L (ref 21–32)
EGFR (African American): >60
EGFR (Non-African Amer.): >60
Glucose: 102 mg/dL — ABNORMAL HIGH (ref 65–99)
Osmolality: 288 (ref 275–301)
Potassium: 4 mmol/L (ref 3.5–5.1)
SGPT (ALT): 33 U/L
Sodium: 143 mmol/L (ref 136–145)
TOTAL PROTEIN: 7 g/dL (ref 6.4–8.2)

## 2015-04-05 NOTE — Consult Note (Signed)
General Aspect DVT associated with ulcerative colitis   Present Illness The patient is a 54 year old male who presents with a 2-day history of left lower extremity swelling and pain. He came to the ER, underwent an ultrasound of his lower extremities that was positive for DVT.  The patient has a previous history of DVT about 18 year or so years ago and was treated with Coumadin. He developed lower extremity pain and swelling shortly thereafter. The patient has a history of ulcerative colitis with active proctitis and bleeding. I am asked to evaluate for IVC filter placement.  Past Medical Hx Ulcerative colitis hypertension DVT   Home Medications: Medication Instructions Status  Asacol 800 mg oral delayed release tablet 2 tab(s) orally 3 times a day Active  aspirin 81 mg oral tablet 1 tab(s) orally once a day Active  Vyvanse 60 mg oral capsule 1 cap(s) orally once a day (in the morning) Active  predniSONE 10 mg oral tablet 1 tab(s) orally once a day Active  Metoprolol Tartrate 25 mg oral tablet 1 tab(s) orally 2 times a day Active  Effexor 37.5 mg oral capsule, extended release 3 cap(s) orally once a day Active    Phenergan: Alt Ment Status  Latex: Unknown  NSAIDS: Other  Case History:  Family History Non-Contributory   Social History negative tobacco, negative ETOH, negative Illicit drugs   Review of Systems:  Fever/Chills No   Cough No   Sputum No   Abdominal Pain No   Diarrhea Yes   Constipation No   Nausea/Vomiting No   SOB/DOE No   Chest Pain No   Telemetry Reviewed NSR   Dysuria No   Tolerating Diet Yes   Physical Exam:  GEN well developed, well nourished, no acute distress   HEENT hearing intact to voice, moist oral mucosa   NECK supple  trachea midline   RESP normal resp effort  no use of accessory muscles   CARD regular rate  no JVD   ABD soft  nondistended   EXTR negative cyanosis/clubbing, left leg with mild edema, moderate varicosities  noted   SKIN No rashes, No ulcers   NEURO cranial nerves intact, follows commands, motor/sensory function intact   PSYCH alert, A+O to time, place, person, good insight   Nursing/Ancillary Notes: **Vital Signs.:   07-Mar-14 13:38  Vital Signs Type Routine  Temperature Temperature (F) 99.2  Celsius 37.3  Temperature Source oral  Pulse Pulse 79  Respirations Respirations 18  Systolic BP Systolic BP 409  Mean BP 811  Pulse Ox % Pulse Ox % 96  Pulse Ox Activity Level  At rest  Oxygen Delivery Room Air/ 21 %   Routine Chem:  06-Mar-14 19:28   Glucose, Serum 94  BUN 17  Creatinine (comp) 0.99  Sodium, Serum 137  Potassium, Serum 4.0  Chloride, Serum 104  CO2, Serum 29  Calcium (Total), Serum 8.8  Anion Gap  4  Osmolality (calc) 275  eGFR (African American) >60  eGFR (Non-African American) >60 (eGFR values <80mL/min/1.73 m2 may be an indication of chronic kidney disease (CKD). Calculated eGFR is useful in patients with stable renal function. The eGFR calculation will not be reliable in acutely ill patients when serum creatinine is changing rapidly. It is not useful in  patients on dialysis. The eGFR calculation may not be applicable to patients at the low and high extremes of body sizes, pregnant women, and vegetarians.)  Routine Coag:  06-Mar-14 19:28   Prothrombin 12.6  INR 0.9 (  INR reference interval applies to patients on anticoagulant therapy. A single INR therapeutic range for coumarins is not optimal for all indications; however, the suggested range for most indications is 2.0 - 3.0. Exceptions to the INR Reference Range may include: Prosthetic heart valves, acute myocardial infarction, prevention of myocardial infarction, and combinations of aspirin and anticoagulant. The need for a higher or lower target INR must be assessed individually. Reference: The Pharmacology and Management of the Vitamin K  antagonists: the seventh ACCP Conference on Antithrombotic  and Thrombolytic Therapy. ZSWFU.9323 Sept:126 (3suppl): [id number]. A HCT value >55% may artifactually increase the PT.  In one study,  the increase was an average of 25%. Reference:  "Effect on Routine and Special Coagulation Testing Values of Citrate Anticoagulant Adjustment in Patients with High HCT Values." American Journal of Clinical Pathology 2006;126:400-405.)  Activated PTT (APTT) 27.1 (A HCT value >55% may artifactually increase the APTT. In one study, the increase was an average of 19%. Reference: "Effect on Routine and Special Coagulation Testing Values of Citrate Anticoagulant Adjustment in Patients with High HCT Values." American Journal of Clinical Pathology 5573;220:254-270.)  07-Mar-14 04:05   Activated PTT (APTT)  70.1 (A HCT value >55% may artifactually increase the APTT. In one study, the increase was an average of 19%. Reference: "Effect on Routine and Special Coagulation Testing Values of Citrate Anticoagulant Adjustment in Patients with High HCT Values." American Journal of Clinical Pathology 2006;126:400-405.)  Routine Hem:  06-Mar-14 19:28   WBC (CBC) 7.7  RBC (CBC) 5.29  Hemoglobin (CBC) 16.8  Hematocrit (CBC) 48.0  Platelet Count (CBC) 185 (Result(s) reported on 16 Feb 2013 at 07:42PM.)  MCV 91  MCH 31.7  MCHC 34.9  RDW 13.7  07-Mar-14 04:05   WBC (CBC) 6.7  RBC (CBC) 4.87  Hemoglobin (CBC) 15.3  Hematocrit (CBC) 43.8  Platelet Count (CBC) 175  MCV 90  MCH 31.5  MCHC 35.0  RDW 13.3  Neutrophil % 45.2  Lymphocyte % 42.8  Monocyte % 8.7  Eosinophil % 2.2  Basophil % 1.1  Neutrophil # 3.0  Lymphocyte # 2.9  Monocyte # 0.6  Eosinophil # 0.2  Basophil # 0.1 (Result(s) reported on 17 Feb 2013 at 04:37AM.)   [place]:    06-Mar-14 16:40, [place] Color Flow Doppler Low Extrem Left (Leg)  [place] Color Flow Doppler Low Extrem Left (Leg)   REASON FOR EXAM:    pain and swelling with a hx of DVT  COMMENTS:       PROCEDURE: [place]  - US DOPPLER LOW EXTR LEFT  - Feb 16 2013  4:40PM     RESULT: Left lower extremity color flow duplex Doppler reveals deep   venous thrombosis present in the superficial femoral vein and popliteal   vein. Thrombus noted in the superficial veins of the calf.    IMPRESSION:  Positive exam for deep venous thrombosis. Superficial calf   vein thrombosis also present.        Verified By: Osa Craver, M.D., MD    Impression 1.  Acute DVT       Given the patient's history of past DVT and the fact that he did well with anticoagulation I would initiate heparin and bridge to Coumadin.  If he developes increased bleeding from his proctitis then he may need a filter at that time for failed anticoagulation. 2.  Ulcerative colitis/proctitis        Dr Vira Agar on consult        continue home meds  3.   Hypertension          continue beta blocker   Plan level 3 consult   Electronic Signatures: Hortencia Pilar (MD)  (Signed 08-Mar-14 14:17)  Authored: General Aspect/Present Illness, Home Medications, Allergies, History and Physical Exam, Vital Signs, Labs, Radiology, Impression/Plan   Last Updated: 08-Mar-14 14:17 by Hortencia Pilar (MD)

## 2015-04-05 NOTE — Discharge Summary (Signed)
PATIENT NAME:  Myrtie CruiseCONRAD, [place] O MR#:  [id number] DATE OF BIRTH:  [birth year]  DATE OF ADMISSION:  02/16/2013 DATE OF DISCHARGE:  02/21/2013  REASON FOR ADMISSION:  Deep venous thrombosis of the left lower extremity.  DISCHARGE DIAGNOSES:  Deep venous thrombosis of the left lower extremity, ulcerative colitis.  NEW MEDICATIONS AT DISCHARGE:  Coumadin 10 mg p.o. once a day.  Continue home medications. Taper off prednisone 20 mg once daily, taper by Dr. Mechele CollinElliott.  FOLLOWUP:  Dr. Mechele CollinElliott in 1 to 2 weeks and Dr. Lorre NickGittin, oncology, on the next week for INR checks.   HOSPITAL COURSE: The patient is a very nice 54 year old gentleman with history of ulcerative colitis with recent exacerbation. The patient comes to the hospital complaining of edema of the left lower extremity. The patient has had DVT in the past after being sick and not moving much around. He was treated with anticoagulation and after 6 months, then stopped.  Next, the day of admission, the patient comes with history of edema of the lower extremity that happened after he had an exacerbation of his ulcerative colitis and a possible gastroenteritis. The patient was spending a lot of time on the toilet and he states that might be the reason of his immobility.  Next, the patient did not have any significant shortness of breath or problems related to possible pulmonary embolism. He was overall asymptomatic. He was admitted, evaluated in the ER.  Please refer to H and P dictated by Dr. Cherlynn KaiserSainani on the day of admission. The patient was put on heparin IV and started on Coumadin. His INR was in the low 1's for the first couple of days and at discharge was still subtherapeutic around 1.7.   Next, the patient was discharged on a dose of 10 mg of Coumadin. A consultation with hematology/oncology was obtained. Dr. Lorre NickGittin saw the patient. He states that he is likely to require anticoagulation for life. Due to his ulcerative colitis, we were trying to avoid  bleeding for why the patient was observed closely. We put him on heparin, which is a short-term reversal instead of Lovenox. The patient did not have any signs of bleeding. He was maintained on 30 mg of prednisone during this hospitalization to avoid any exacerbations of his ulcerative colitis and he, overall, did very well. The patient was discharged with follow with PCP, Dr. Mechele CollinElliott and Dr. Lorre NickGittin.  No complications during this hospitalization.   I spent about 35 minutes with this patient at discharge.    ____________________________ Felipa Furnaceoberto Sanchez Gutierrez, MD rsg:ce D: 02/24/2013 13:53:34 ET T: 02/25/2013 10:48:33 ET JOB#: [id number]  cc: Felipa Furnaceoberto Sanchez Gutierrez, MD, <Dictator> ROBERTO Juanda ChanceSANCHEZ GUTIERRE MD ELECTRONICALLY SIGNED 03/09/2013 17:12

## 2015-04-05 NOTE — Consult Note (Signed)
Pt CC is DVT.  Left sided Ulcerative colitis doing well.  No bleeding, no diarrhea, no abd or rectal pain.  When his INR is high enough he can go home.  Please get appt to see me in a few weeks- month, he can always call if anything abnormal starts.Would taper slowly to 20mg  a day of prednisone.  Electronic Signatures: Scot JunElliott, Sheridan T (MD)  (Signed on 10-Mar-14 17:37)  Authored  Last Updated: 10-Mar-14 17:37 by Scot JunElliott, Denise T (MD)

## 2015-04-05 NOTE — H&P (Signed)
PATIENT NAME:  Nicholas Bonilla, Nicholas Bonilla DATE OF BIRTH:  Dec 29, 1960  DATE OF ADMISSION:  02/16/2013  PRIMARY CARE PHYSICIAN: Dr. Marga MelnickWilliam Hopper   GASTROENTEROLOGIST:  Dr.  Mechele CollinElliott.   CHIEF COMPLAINT: Left lower extremity swelling and pain.   HISTORY OF PRESENT ILLNESS: This is a [age] year old male who presents with a 2-day history of left lower extremity swelling and pain. He came to the ER, underwent an ultrasound of his lower extremities that was positive for DVT. He is being admitted for further treatment and evaluation. The patient has a previous history of DVT about 18 year or so years ago and was treated with Coumadin. The patient said that he had a viral illness about 2 to 3 days ago, a GI bug that caused him to have persistent nausea, vomiting and he pretty much lied in bed for about 2 to 3 days. He then developed lower extremity pain and swelling shortly thereafter. The patient has a history of ulcerative colitis with active proctitis and bleeding. He is therefore high risk for anticoagulation and therefore is being admitted to the hospital for further treatment and evaluation of DVT.   REVIEW OF SYSTEMS: CONSTITUTIONAL: No documented fever. No weight gain or weight loss.  EYES: No blurry or double vision.  ENT: No tinnitus. No postnasal drip. No redness of the oropharynx.  RESPIRATORY: No cough, no wheeze, no hemoptysis, no dyspnea.  CARDIOVASCULAR: No chest pain, no orthopnea, no palpitations, no syncope.  GASTROINTESTINAL: No nausea, no vomiting, no diarrhea, no abdominal pain. Positive mild hematochezia. No melena.  GENITOURINARY: No dysuria, no hematuria.  ENDOCRINE: No polyuria or nocturia. No heat or cold intolerance.  HEMATOLOGIC:  No anemia. No bruising, no acute bleeding.  INTEGUMENTARY: No rashes. No lesions.  MUSCULOSKELETAL: No arthritis, no swelling, no gout.  NEUROLOGIC: No numbness or tingling. No ataxia. No seizure-type activity.  PSYCHIATRIC: No anxiety. No  insomnia. No ADD. Positive depression.   ALLERGIES: NSAIDS, PHENERGAN, LATEX.   SOCIAL HISTORY: No smoking. Occasional alcohol use. No illicit drug abuse. Lives at home with his wife.   FAMILY HISTORY: The patient's mother is alive and is healthy, just has dementia. Father died from complications of heart disease. There is no family history of DVT or clotting problem.   CURRENT MEDICATIONS: Are as follows: Aspirin 81 mg daily, Asacol 800 mg 2 tabs t.i.d. with meals, Effexor 112.5 mg daily, metoprolol tartrate 25 mg b.i.d., prednisone 10 mg daily and Vyvanse 60 mg daily.   PHYSICAL EXAMINATION: On admission is as follows:   VITAL SIGNS:  Are noted to be:  Temperature 96.7, pulse 88, respirations 18, blood pressure 128/79, sats 98% on room air.  GENERAL: The patient is a pleasant appearing male in no apparent distress.  HEENT: Atraumatic, normocephalic. Extraocular muscles are intact. Pupils are equal and reactive to light. Sclerae anicteric. No conjunctival injection. No pharyngeal erythema.  NECK: Supple. No jugular venous distention. No bruits, no lymphadenopathy, no thyromegaly.  HEART: Regular rate and rhythm. No murmurs, rubs or clicks.  LUNGS: Clear to auscultation bilaterally. No rales, no rhonchi, no wheezes.  ABDOMEN: Soft, flat, nontender, nondistended. Has good bowel sounds. No hepatosplenomegaly appreciated.  EXTREMITIES: No evidence of any cyanosis or clubbing. He does have +1 to 2 pitting edema from the on the left side as compared to the right. No cyanosis or clubbing. +2 pedal and radial pulses bilaterally.  NEUROLOGIC: The patient is alert, awake, and oriented x 3 with no focal motor or sensory deficits  appreciated bilaterally.  SKIN: Moist and warm with no rashes.  LYMPHATIC: There is no cervical or axillary lymphadenopathy.   LABORATORY, DIAGNOSTIC AND RADIOLOGIC DATA:  Showed a serum glucose 94, BUN 17, creatinine 0.9, sodium 137, potassium 4, chloride 104, bicarbonate 29.  White cell count 7.7, hemoglobin 16.8, hematocrit 48, blood count 185, INR 0.9.   The patient did have ultrasound of the left lower extremity showing a positive exam for DVT in the superficial femoral vein and also the popliteal vein.   ASSESSMENT AND PLAN: This is a 54 year old male with a history of ulcerative colitis, hypertension, depression, history of previous deep vein thrombosis, who presents to the hospital with left lower extremity pain and swelling and noted to have an acute deep vein thrombosis.  1.  Acute left lower extremity deep vein thrombosis. The likely risk factor for the patient is being bedbound with a viral illness for the past 2 to 3 days. He is high risk for anticoagulation, given his active proctitis and ulcerative colitis with hematochezia. Therefore, I cannot give the patient Xarelto at this point. I will admit the patient and start him on a low dose heparin nomogram. He would likely need to be discharged on some low dose Coumadin to keep his INR closer 2. I will also get a vascular surgery consult for possible need for IVC filter. The patient currently has no chest pain, no shortness of breath.  2.  Hypertension. The patient is presently hemodynamically stable. I will continue his metoprolol.  3.  History of ulcerative colitis. The patient is currently on prednisone. I will increase his dose of prednisone given the fact that he would be at high risk of bleeding. He will also continue his  Asacol. I will get a gastroenterology consult. The patient is well known to Dr. Mechele Collin.  4.  Depression. Continue with Effexor and Vyvanse.   CODE STATUS: The patient is a full code.   TIME SPENT: 50 minutes.    ____________________________ Rolly Pancake. Cherlynn Kaiser, MD vjs:cc D: 02/16/2013 20:43:24 ET T: 02/16/2013 21:12:26 ET JOB#: [id number]  cc: Rolly Pancake. Cherlynn Kaiser, MD, <Dictator> Houston Siren MD ELECTRONICALLY SIGNED 02/22/2013 8:16

## 2015-04-05 NOTE — Consult Note (Signed)
Chief Complaint:  Subjective/Chief Complaint Feeling well. On heparin/coumadin. Started cortenema last night.   VITAL SIGNS/ANCILLARY NOTES: **Vital Signs.:   09-Mar-14 04:53  Vital Signs Type Routine  Temperature Temperature (F) 98.2  Celsius 36.7  Temperature Source oral  Pulse Pulse 77  Respirations Respirations 18  Systolic BP Systolic BP 120  Diastolic BP (mmHg) Diastolic BP (mmHg) 74  Mean BP 89  Pulse Ox % Pulse Ox % 96  Pulse Ox Activity Level  At rest  Oxygen Delivery Room Air/ 21 %   Brief Assessment:  Cardiac Regular   Respiratory clear BS   Gastrointestinal Normal   Assessment/Plan:  Assessment/Plan:  Assessment U.C. DVT.   Plan Contineu anticoagulation. Continue prednisone/cortenema. Dr. Mechele CollinElliott to see patient tomorrow. Thanks.   Electronic Signatures: Lutricia Feilh, Paul (MD)  (Signed 09-Mar-14 09:22)  Authored: Chief Complaint, VITAL SIGNS/ANCILLARY NOTES, Brief Assessment, Assessment/Plan   Last Updated: 09-Mar-14 09:22 by Lutricia Feilh, Paul (MD)

## 2015-04-05 NOTE — Consult Note (Signed)
PATIENT NAME:  Nicholas Bonilla, Nicholas Bonilla MR#:  [id number] DATE OF BIRTH:  [birth year]  DATE OF CONSULTATION:  02/18/2013  CONSULTING PHYSICIAN:  Knute Neuobert G. Zakaiya Lares, MD  HISTORY OF PRESENT ILLNESS: This is a 54 year old patient who I saw and evaluated in consultation on March 8, although this full dictation is rendered at a later date after a delay. The patient is a 54 year old male who was admitted on March 6 with lower extremity swelling and pain, and ultrasound showed a DVT in the left leg. He had had 2 days prior a viral illness. He was in bed a lot of the day, slightly dehydrated, and just 2 days of the onset of that illness he noted the beginning of symptoms. He was hospitalized and was given heparin and then Coumadin was started. He has a history of ulcerative colitis with proctitis and bleeding, was not having active bleeding at the time of admission. He was not having other acute issues. His baseline PT and PTT were normal. His CBC was unremarkable. His swelling was stable, already improving.   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 He had hernia surgery in the past. Additional history of a blood clot 18 years ago that may have been precipitated by brief travel. He had a short plane trip of less than an hour and then he went and saw a play the subsequent day. He had no prolonged travel, no prolonged immobility, no injury. He took Coumadin for 3 months after that and then took aspirin.   ALLERGIES: LISTED AS NSAIDS, PHENERGAN AND LATEX, BUT HE WAS TAKING ASPIRIN AND ASACOL WITHOUT ALLERGY.   SOCIAL HISTORY: Negative for smoking. Occasional alcohol use.   FAMILY HISTORY: Noncontributory. No family history of clotting or malignancy.   MEDICATIONS ON ADMISSION: Aspirin 81 mg daily (that he was taking since a blood clot years ago), Asacol 800 mg 2 tabs t.i.d. with meals, Effexor 112.5 mg daily, metoprolol 25 b.i.d., prednisone 10 mg  daily, Vyvanse 60 mg daily.   REVIEW OF SYSTEMS: When the patient was seen, he was comfortable and he was not having any leg pain, nor any focal or significant constitutional symptoms. He had no headache, dizziness, visual disturbances, ear or jaw pain, cough, wheezing, hemoptysis, retrosternal chest pain. No nausea, vomiting, diarrhea. He had had and intermittently has hematochezia, but he was not having active bleeding at that time. No dysuria or hematuria. No polyuria or polydipsia, hot or cold intolerance. No rash or bruising. No history of blood disease. No focal weakness. No joint swelling. No history stroke or seizure. No history of jaundice or liver disease. He did have a history of depression and he was comfortable. His mood and affect were at baseline.   PHYSICAL EXAMINATION:  GENERAL: He was alert and cooperative.  HEENT: Sclerae: No jaundice. Mouth: No thrush.  LYMPH: No palpable lymph nodes in the neck, supraclavicular, submandibular, axilla.  LUNGS: Clear. No wheezing or rales.  HEART: Regular.  ABDOMEN: Benign, nontender. No palpable mass or organomegaly.  EXTREMITIES: He had some trace edema still in the left leg.  NEUROLOGIC: Grossly nonfocal. Cranial nerves intact. Moving all extremities. I did not test his gait. He was in the bed.   LABORATORY, DIAGNOSTIC AND RADIOLOGICAL DATA: His creatinine was 0.9 on admission. His white count was 7.7, hemoglobin 16.8, hematocrit 48, platelets 185. His PT and PTT at baseline were normal. Ultrasound of the leg showed DVT in the superficial femoral and popliteal vein.  IMPRESSION: This is a patient with a clot and a prior history of clot years ago, some minor clinical but no very strong precipitating events, no family history, no personal malignancy history. The patient is up-to-date on colonoscopy. He had baseline unremarkable CBC.   PLAN: The patient will be converted from heparin to Coumadin in the usual fashion. We can check a lupus  inhibitor screen and cardiolipin antibodies and some basic anticoagulation studies. He had already had some Coumadin, did not check protein C or S studies. Later can check tumor markers, then in the future follow as an outpatient and make a determination about length of anticoagulation.    ____________________________ Knute Neu [name], MD rgg:jm D: 02/28/2013 15:09:00 ET T: 02/28/2013 15:36:35 ET JOB#: [id number]  cc: Knute Neu. [name], MD, <Dictator> Marin Roberts MD ELECTRONICALLY SIGNED 03/19/2013 10:41

## 2015-04-05 NOTE — Consult Note (Signed)
Chief Complaint:  Subjective/Chief Complaint Covering for Dr. Mechele CollinElliott. No acute bleeding on coumadin. 5-6 BM's in AM. On prednisone 30mg  daily. Mild rectal discomfort.   VITAL SIGNS/ANCILLARY NOTES: **Vital Signs.:   08-Mar-14 05:45  Vital Signs Type Routine  Temperature Temperature (F) 98.2  Celsius 36.7  Temperature Source oral  Pulse Pulse 75  Respirations Respirations 18  Systolic BP Systolic BP 118  Diastolic BP (mmHg) Diastolic BP (mmHg) 77  Mean BP 90  Pulse Ox % Pulse Ox % 97  Pulse Ox Activity Level  At rest  Oxygen Delivery Room Air/ 21 %   Brief Assessment:  Cardiac Regular   Respiratory clear BS   Gastrointestinal Normal   Lab Results: Routine Coag:  08-Mar-14 04:19   Prothrombin 12.6  INR 0.9 (INR reference interval applies to patients on anticoagulant therapy. A single INR therapeutic range for coumarins is not optimal for all indications; however, the suggested range for most indications is 2.0 - 3.0. Exceptions to the INR Reference Range may include: Prosthetic heart valves, acute myocardial infarction, prevention of myocardial infarction, and combinations of aspirin and anticoagulant. The need for a higher or lower target INR must be assessed individually. Reference: The Pharmacology and Management of the Vitamin K  antagonists: the seventh ACCP Conference on Antithrombotic and Thrombolytic Therapy. Chest.2004 Sept:126 (3suppl): [id number]. A HCT value >55% may artifactually increase the PT.  In one study,  the increase was an average of 25%. Reference:  "Effect on Routine and Special Coagulation Testing Values of Citrate Anticoagulant Adjustment in Patients with High HCT Values." American Journal of Clinical Pathology 2006;126:400-405.)  Activated PTT (APTT)  84.6 (A HCT value >55% may artifactually increase the APTT. In one study, the increase was an average of 19%. Reference: "Effect on Routine and Special Coagulation Testing Values of Citrate  Anticoagulant Adjustment in Patients with High HCT Values." American Journal of Clinical Pathology 2006;126:400-405.)  Routine Hem:  08-Mar-14 04:19   WBC (CBC) 9.0  RBC (CBC) 5.21  Hemoglobin (CBC) 16.4  Hematocrit (CBC) 46.7  Platelet Count (CBC) 200  MCV 90  MCH 31.5  MCHC 35.2  RDW 13.4  Neutrophil % 53.0  Lymphocyte % 35.7  Monocyte % 8.5  Eosinophil % 1.0  Basophil % 1.8  Neutrophil # 4.8  Lymphocyte # 3.2  Monocyte # 0.8  Eosinophil # 0.1  Basophil #  0.2 (Result(s) reported on 18 Feb 2013 at 04:54AM.)   Assessment/Plan:  Assessment/Plan:  Assessment Ulcerative proctitis with DVT.   Plan Continue prednisone. Ok to resume cortenema at bedtime daily. Will follow. Thanks.   Electronic Signatures: Lutricia Feilh, Paul (MD)  (Signed 08-Mar-14 10:37)  Authored: Chief Complaint, VITAL SIGNS/ANCILLARY NOTES, Brief Assessment, Lab Results, Assessment/Plan   Last Updated: 08-Mar-14 10:37 by Lutricia Feilh, Paul (MD)

## 2015-04-05 NOTE — Consult Note (Signed)
CC:DVT, ulcerative proctitis.  Pt INR up to 1.5.  Leg swelling gone.  I think he can go home on coumadin and stay off feet and we can check his INR on Friday.  Discussed with patient.  If you are especially concerned that his INR is not quite therapeutic then another day in hospital may be needed.  Due to no antidote to Lovenox and my own experience with it after polypectomy I am reluctant to use it in his case.  Page me if any concern either way.  Electronic Signatures: Scot JunElliott, Loden T (MD)  (Signed on 11-Mar-14 11:51)  Authored  Last Updated: 11-Mar-14 11:51 by Scot JunElliott, Elvert T (MD)

## 2015-04-05 NOTE — Consult Note (Signed)
Brief Consult Note: Diagnosis: dvt.   Patient was seen by consultant.   Discussed with Attending MD.   Comments: PATIENT SEEN AND EVALUATED 02/18/13,  DICTATED NOTE TO FOLLOW. WILL LATER F/U IN CANCER CENTER TO DETERMINE LENGTH OF ANTICOAGULATION AND POSSIBLE FURTHUR W/U.  Electronic Signatures: Marin RobertsGittin, Delson G (MD)  (Signed 10-Mar-14 09:07)  Authored: Brief Consult Note   Last Updated: 10-Mar-14 09:07 by Marin RobertsGittin, Vidit G (MD)

## 2015-04-05 NOTE — Consult Note (Signed)
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  He has been reluctant to try immunomodulators such as Imuran or 6-MP.  Also the biologics such as Remicade or Humira.  he had a previous DVT 18 years ago and successfully was treated with coumadin with a 3-6 month course.  He was recently ill with diarrhea/vomiting and dehydration and prolonged bed rest and now has a DVT.  Options discussed with him and Dr. Gilda CreaseSchnier.  Will try to bump up his prednisone to 30mg  a day and heparin drip followed by coumadin.  If he bleeds so much that alternative treatment is needed then may get a filter but due to long term effects on leg veins in a relatively young person Dr. Lorretta HarpSchneir would like to hold it in reserve.  I have followed him for over 20 years and will do so in hospital but Dr. Bluford Kaufmannh on call this weekend.  Electronic Signatures: Scot JunElliott, Ercole T (MD)  (Signed on 07-Mar-14 13:15)  Authored  Last Updated: 07-Mar-14 13:15 by Scot JunElliott, Orry T (MD)

## 2015-04-05 NOTE — Consult Note (Signed)
Brief Consult Note: Diagnosis: left leg DVTulcerative colitis.   Discussed with Attending MD.   Comments: continue heparin for now as he is tolerating anticoagulation  if bleeding ensues would proceed with filter  Patient voiced excellent understanding.  Electronic Signatures: Levora DredgeSchnier, Gregory (MD)  (Signed 07-Mar-14 20:00)  Authored: Brief Consult Note   Last Updated: 07-Mar-14 20:00 by Levora DredgeSchnier, Gregory (MD)

## 2015-09-02 ENCOUNTER — Ambulatory Visit: Payer: BLUE CROSS/BLUE SHIELD | Admitting: Podiatry

## 2015-09-11 ENCOUNTER — Ambulatory Visit (INDEPENDENT_AMBULATORY_CARE_PROVIDER_SITE_OTHER): Payer: BLUE CROSS/BLUE SHIELD

## 2015-09-11 ENCOUNTER — Encounter: Payer: Self-pay | Admitting: Podiatry

## 2015-09-11 ENCOUNTER — Ambulatory Visit (INDEPENDENT_AMBULATORY_CARE_PROVIDER_SITE_OTHER): Payer: BLUE CROSS/BLUE SHIELD | Admitting: Podiatry

## 2015-09-11 VITALS — BP 131/82 | HR 85 | Resp 18

## 2015-09-11 DIAGNOSIS — Z01812 Encounter for preprocedural laboratory examination: Secondary | ICD-10-CM | POA: Diagnosis not present

## 2015-09-11 DIAGNOSIS — R2242 Localized swelling, mass and lump, left lower limb: Secondary | ICD-10-CM

## 2015-09-11 DIAGNOSIS — R52 Pain, unspecified: Secondary | ICD-10-CM

## 2015-09-11 NOTE — Progress Notes (Signed)
   Subjective:    Patient ID: Nicholas Bonilla, male    DOB: 1961-07-02, 54 y.o.   MRN: [id number]  HPI I HAVE A KNOT ON MY LEFT BIG TOE AND IT HAS BEEN GOING ON FOR ABOUT 2 YEARS AND HAS GOTTEN BIG AND HURTS WHEN I WALK AND IS SORE AND TENDER AND BURNS SOME. He states that his primary doctor wanted [place] to take a look at it. He states that it feels like it goes all way through from the top to the bottom and stays red and sore. I asked if it had ever been fluctuant and he states that he has not that it has been firm. Never had an ulceration and has never drained.  Review of Systems  All other systems reviewed and are negative.      Objective:   Physical Exam: 54 year old male in no apparent distress vital signs stable alert and oriented 3. Pulses are strongly palpable. Neurologic sensorium is intact per Semmes-Weinstein monofilament. Deep tendon reflexes are intact bilateral and muscle strength +5 over 5 dorsiflexion plantar flexors and inverters everters on physical musculature is intact. Orthopedic evaluation of the left foot demonstrates all joints distally to the ankle for range of motion with exception of the IP joint. The IP joint left does demonstrate edema and a firm nonpulsatile mass extending from the dorsal aspect medially to the plantar aspect medially it is firm and radiographic evaluation does not demonstrate any type of osseous mass within this area. This does not appear to be ganglion more of a fibroma-type feel to it. Cutaneous evaluation demonstrates supple well-hydrated cutis with exception of the mass to the medial aspect of the toe. Contralateral foot is not the same.        Assessment & Plan:  Assessment: Mass hallux interphalangeal joint left foot.  Plan: I find this to be somewhat suspicious so at this point we are going to request an MRI and we will consider surgical excision.

## 2015-09-12 ENCOUNTER — Telehealth: Payer: Self-pay | Admitting: *Deleted

## 2015-09-13 NOTE — Telephone Encounter (Signed)
Prior authorization received for [employer] [place] Approveal code order #[id number] for MRI [id number], dx R22.42.

## 2015-12-30 DIAGNOSIS — M79673 Pain in unspecified foot: Secondary | ICD-10-CM

## 2017-10-01 ENCOUNTER — Other Ambulatory Visit: Payer: Self-pay | Admitting: Orthopedic Surgery

## 2017-10-21 ENCOUNTER — Encounter
Admission: RE | Admit: 2017-10-21 | Discharge: 2017-10-21 | Disposition: A | Discharge status: Home / Self Care | Payer: BLUE CROSS/BLUE SHIELD | Source: Ambulatory Visit | Attending: Orthopedic Surgery | Admitting: Orthopedic Surgery

## 2017-10-21 ENCOUNTER — Other Ambulatory Visit: Payer: Self-pay

## 2017-10-21 DIAGNOSIS — Z01812 Encounter for preprocedural laboratory examination: Secondary | ICD-10-CM | POA: Diagnosis not present

## 2017-10-21 DIAGNOSIS — X58XXXA Exposure to other specified factors, initial encounter: Secondary | ICD-10-CM | POA: Diagnosis not present

## 2017-10-21 DIAGNOSIS — S46001A Unspecified injury of muscle(s) and tendon(s) of the rotator cuff of right shoulder, initial encounter: Secondary | ICD-10-CM | POA: Insufficient documentation

## 2017-10-21 HISTORY — DX: Personal history of other venous thrombosis and embolism: Z86.718

## 2017-10-21 HISTORY — DX: Personal history of urinary calculi: Z87.442

## 2017-10-21 HISTORY — DX: Unspecified osteoarthritis, unspecified site: M19.90

## 2017-10-21 LAB — CBC WITH DIFFERENTIAL/PLATELET
BASOS ABS: 0.1 10*3/uL (ref 0–0.1)
BASOS PCT: 1 %
EOS ABS: 0.2 10*3/uL (ref 0–0.7)
Eosinophils Relative: 4 %
HEMATOCRIT: 50 % (ref 40.0–52.0)
HEMOGLOBIN: 17.4 g/dL (ref 13.0–18.0)
Lymphocytes Relative: 37 %
Lymphs Abs: 2 10*3/uL (ref 1.0–3.6)
MCH: 31.3 pg (ref 26.0–34.0)
MCHC: 34.8 g/dL (ref 32.0–36.0)
MCV: 90.2 fL (ref 80.0–100.0)
MONOS PCT: 9 %
Monocytes Absolute: 0.5 10*3/uL (ref 0.2–1.0)
NEUTROS ABS: 2.7 10*3/uL (ref 1.4–6.5)
NEUTROS PCT: 49 %
Platelets: 158 10*3/uL (ref 150–440)
RBC: 5.55 MIL/uL (ref 4.40–5.90)
RDW: 13.1 % (ref 11.5–14.5)
WBC: 5.5 10*3/uL (ref 3.8–10.6)

## 2017-10-21 LAB — BASIC METABOLIC PANEL
ANION GAP: 9 (ref 5–15)
BUN: 27 mg/dL — ABNORMAL HIGH (ref 6–20)
CALCIUM: 9.6 mg/dL (ref 8.9–10.3)
CO2: 27 mmol/L (ref 22–32)
CREATININE: 1.14 mg/dL (ref 0.61–1.24)
Chloride: 102 mmol/L (ref 101–111)
Glucose, Bld: 106 mg/dL — ABNORMAL HIGH (ref 65–99)
Potassium: 4.2 mmol/L (ref 3.5–5.1)
SODIUM: 138 mmol/L (ref 135–145)

## 2017-10-21 LAB — PROTIME-INR
INR: 0.95
PROTHROMBIN TIME: 12.6 s (ref 11.4–15.2)

## 2017-10-21 LAB — APTT: APTT: 31 s (ref 24–36)

## 2017-10-21 NOTE — Patient Instructions (Signed)
Your procedure is scheduled on:October 28, 2017 (THURSDAY ) Report to Same Day Surgery. SECOND FLOOR (MEDICAL MALL) To find out your arrival time please call [phone] between 1PM - 3PM on October 27, 2017 [facility](WEDNESDAY  ).  Remember: Instructions that are not followed completely may result in serious medical risk, up to and including death, or upon the discretion of your surgeon and anesthesiologist your surgery may need to be rescheduled.     _X__ 1. Do not eat food after midnight the night before your procedure.                 No gum chewing or hard candies. You may drink clear liquids up to 2 hours                 before you are scheduled to arrive for your surgery- DO not drink clear                 liquids within 2 hours of the start of your surgery.                 Clear Liquids include:  water, apple juice without pulp, clear carbohydrate                 drink such as Clearfast of Gartorade, Black Coffee or Tea (Do not add                 anything to coffee or tea).     _X__ 2.  No Alcohol for 24 hours before or after surgery.   _X__ 3.  Do Not Smoke or use e-cigarettes For 24 Hours Prior to Your Surgery.                 Do not use any chewable tobacco products for at least 6 hours prior to                 surgery.  ____  4.  Bring all medications with you on the day of surgery if instructed.   _X___  5.  Notify your doctor if there is any change in your medical condition      (cold, fever, infections).     Do not wear jewelry, make-up, hairpins, clips or nail polish. Do not wear lotions, powders, or perfumes.  Do not shave 48 hours prior to surgery. Men may shave face and neck. Do not bring valuables to the hospital.    [facility] not [facility] for any belongings or valuables.  Contacts, dentures or bridgework may not be worn into surgery. Leave your suitcase in the car. After surgery it may be brought to your room. For patients admitted  to the hospital, discharge time is determined by your treatment team.   Patients discharged the day of surgery will not be allowed to drive home.   Please read over the following fact sheets that you were given:             __X__ Take these medicines the morning of surgery with A SIP OF WATER:    1. AMLODIPINE  2. METOPROLOL  3. EFFEXOR  4. CORTEF  5.  6.  ____ Fleet Enema (as directed)   _X___ Use CHG Soap as directed  ____ Use inhalers on the day of surgery  ____ Stop metformin 2 days prior to surgery    ____ Take 1/2 of usual insulin dose the night before surgery. No insulin the morning  of surgery.   _X___ Stop Coumadin/Plavix/aspirin on (NO ASPIRIN )  ____ Stop Anti-inflammatories on (NO ASPIRIN PRODUCTS SEVEN-TEN DAYS PRIOR TO SURGERY )NO MOTRIN, ALEVE.ADVIL, BC'S, GOODY'S, IBUPROFEN    _X___ Stop supplements until after surgery.  (STOP CO Q 10, AND ALL SUPPLEMENTS NOW )  ____ Bring C-Pap to the hospital.

## 2017-10-27 MED ORDER — CEFAZOLIN SODIUM-DEXTROSE 2-4 GM/100ML-% IV SOLN
2.0000 g | INTRAVENOUS | Status: AC
  Administered 2017-10-28: 2 g via INTRAVENOUS

## 2017-10-28 ENCOUNTER — Encounter
Admission: RE | Disposition: A | Discharge status: Home / Self Care | Payer: Self-pay | Source: Ambulatory Visit | Attending: Orthopedic Surgery

## 2017-10-28 ENCOUNTER — Encounter: Payer: Self-pay | Admitting: *Deleted

## 2017-10-28 ENCOUNTER — Ambulatory Visit: Payer: BLUE CROSS/BLUE SHIELD | Admitting: Anesthesiology

## 2017-10-28 ENCOUNTER — Ambulatory Visit
Admission: RE | Admit: 2017-10-28 | Discharge: 2017-10-28 | Disposition: A | Discharge status: Home / Self Care | Payer: BLUE CROSS/BLUE SHIELD | Source: Ambulatory Visit | Attending: Orthopedic Surgery | Admitting: Orthopedic Surgery

## 2017-10-28 DIAGNOSIS — Z79899 Other long term (current) drug therapy: Secondary | ICD-10-CM | POA: Insufficient documentation

## 2017-10-28 DIAGNOSIS — Z87891 Personal history of nicotine dependence: Secondary | ICD-10-CM | POA: Diagnosis not present

## 2017-10-28 DIAGNOSIS — F909 Attention-deficit hyperactivity disorder, unspecified type: Secondary | ICD-10-CM | POA: Diagnosis not present

## 2017-10-28 DIAGNOSIS — M19011 Primary osteoarthritis, right shoulder: Secondary | ICD-10-CM | POA: Insufficient documentation

## 2017-10-28 DIAGNOSIS — I739 Peripheral vascular disease, unspecified: Secondary | ICD-10-CM | POA: Insufficient documentation

## 2017-10-28 DIAGNOSIS — M75121 Complete rotator cuff tear or rupture of right shoulder, not specified as traumatic: Secondary | ICD-10-CM | POA: Diagnosis not present

## 2017-10-28 DIAGNOSIS — I10 Essential (primary) hypertension: Secondary | ICD-10-CM | POA: Diagnosis not present

## 2017-10-28 DIAGNOSIS — Z79891 Long term (current) use of opiate analgesic: Secondary | ICD-10-CM | POA: Insufficient documentation

## 2017-10-28 DIAGNOSIS — M25811 Other specified joint disorders, right shoulder: Secondary | ICD-10-CM | POA: Insufficient documentation

## 2017-10-28 HISTORY — PX: SHOULDER ARTHROSCOPY WITH OPEN ROTATOR CUFF REPAIR: SHX6092

## 2017-10-28 SURGERY — ARTHROSCOPY, SHOULDER WITH REPAIR, ROTATOR CUFF, OPEN
Anesthesia: General | Site: Shoulder | Laterality: Right | Wound class: Clean

## 2017-10-28 MED ORDER — ROCURONIUM BROMIDE 100 MG/10ML IV SOLN
INTRAVENOUS | Status: DC | PRN
  Administered 2017-10-28 (×3): 20 mg via INTRAVENOUS
  Administered 2017-10-28: 30 mg via INTRAVENOUS

## 2017-10-28 MED ORDER — PROPOFOL 10 MG/ML IV BOLUS
INTRAVENOUS | Status: DC | PRN
Start: 2017-10-28 — End: 2017-10-28
  Administered 2017-10-28: 200 mg via INTRAVENOUS

## 2017-10-28 MED ORDER — BUPIVACAINE LIPOSOME 1.3 % IJ SUSP
INTRAMUSCULAR | Status: AC
  Filled 2017-10-28: qty 20

## 2017-10-28 MED ORDER — MIDAZOLAM HCL 2 MG/2ML IJ SOLN
INTRAMUSCULAR | Status: DC | PRN
  Administered 2017-10-28: 2 mg via INTRAVENOUS

## 2017-10-28 MED ORDER — SUGAMMADEX SODIUM 200 MG/2ML IV SOLN
INTRAVENOUS | Status: DC | PRN
  Administered 2017-10-28: 200 mg via INTRAVENOUS

## 2017-10-28 MED ORDER — FAMOTIDINE 20 MG PO TABS
ORAL_TABLET | ORAL | Status: AC
  Administered 2017-10-28: 20 mg via ORAL
  Filled 2017-10-28: qty 1

## 2017-10-28 MED ORDER — GLYCOPYRROLATE 0.2 MG/ML IJ SOLN
INTRAMUSCULAR | Status: AC
  Filled 2017-10-28: qty 3

## 2017-10-28 MED ORDER — FENTANYL CITRATE (PF) 100 MCG/2ML IJ SOLN
INTRAMUSCULAR | Status: AC
  Administered 2017-10-28: 50 ug via INTRAVENOUS
  Filled 2017-10-28: qty 2

## 2017-10-28 MED ORDER — ONDANSETRON HCL 4 MG/2ML IJ SOLN
INTRAMUSCULAR | Status: DC | PRN
  Administered 2017-10-28: 4 mg via INTRAVENOUS

## 2017-10-28 MED ORDER — FENTANYL CITRATE (PF) 100 MCG/2ML IJ SOLN
INTRAMUSCULAR | Status: DC | PRN
  Administered 2017-10-28: 50 ug via INTRAVENOUS

## 2017-10-28 MED ORDER — DEXAMETHASONE SODIUM PHOSPHATE 10 MG/ML IJ SOLN
INTRAMUSCULAR | Status: AC
  Filled 2017-10-28: qty 1

## 2017-10-28 MED ORDER — ONDANSETRON HCL 4 MG/2ML IJ SOLN
INTRAMUSCULAR | Status: AC
  Filled 2017-10-28: qty 2

## 2017-10-28 MED ORDER — ATROPINE SULFATE 0.4 MG/ML IJ SOLN
INTRAMUSCULAR | Status: DC | PRN
  Administered 2017-10-28: .1 mg via INTRAVENOUS

## 2017-10-28 MED ORDER — NEOMYCIN-POLYMYXIN B GU 40-200000 IR SOLN
Status: AC
  Filled 2017-10-28: qty 2

## 2017-10-28 MED ORDER — EPINEPHRINE PF 1 MG/ML IJ SOLN
INTRAMUSCULAR | Status: AC
  Filled 2017-10-28: qty 1

## 2017-10-28 MED ORDER — EPINEPHRINE PF 1 MG/ML IJ SOLN
INTRAMUSCULAR | Status: DC | PRN
  Administered 2017-10-28: 10 mL

## 2017-10-28 MED ORDER — BUPIVACAINE HCL (PF) 0.25 % IJ SOLN
INTRAMUSCULAR | Status: AC
  Filled 2017-10-28: qty 30

## 2017-10-28 MED ORDER — OXYCODONE HCL 5 MG/5ML PO SOLN: 5.0000 mg | Freq: Once | ORAL | Status: DC | PRN

## 2017-10-28 MED ORDER — EPINEPHRINE PF 1 MG/10ML IJ SOSY
PREFILLED_SYRINGE | INTRAMUSCULAR | Status: DC | PRN
  Administered 2017-10-28 (×12): 10 ug via INTRAVENOUS

## 2017-10-28 MED ORDER — LACTATED RINGERS IV BOLUS (SEPSIS): 500.0000 mL | Freq: Once | INTRAVENOUS | Status: DC

## 2017-10-28 MED ORDER — GLYCOPYRROLATE 0.2 MG/ML IJ SOLN
INTRAMUSCULAR | Status: AC
  Filled 2017-10-28: qty 1

## 2017-10-28 MED ORDER — FENTANYL CITRATE (PF) 100 MCG/2ML IJ SOLN
INTRAMUSCULAR | Status: AC
  Filled 2017-10-28: qty 2

## 2017-10-28 MED ORDER — CHLORHEXIDINE GLUCONATE CLOTH 2 % EX PADS: 6.0000 | MEDICATED_PAD | Freq: Once | CUTANEOUS | Status: DC

## 2017-10-28 MED ORDER — ONDANSETRON HCL 4 MG PO TABS: 4.0000 mg | ORAL_TABLET | Freq: Three times a day (TID) | ORAL | 0 refills | Status: DC | PRN

## 2017-10-28 MED ORDER — FAMOTIDINE 20 MG PO TABS
20.0000 mg | ORAL_TABLET | Freq: Once | ORAL | Status: AC
  Administered 2017-10-28: 20 mg via ORAL

## 2017-10-28 MED ORDER — MIDAZOLAM HCL 2 MG/2ML IJ SOLN
INTRAMUSCULAR | Status: AC
Start: 2017-10-28 — End: 2017-10-28
  Administered 2017-10-28: 1 mg via INTRAVENOUS
  Filled 2017-10-28: qty 2

## 2017-10-28 MED ORDER — LIDOCAINE HCL (PF) 2 % IJ SOLN
INTRAMUSCULAR | Status: AC
  Filled 2017-10-28: qty 10

## 2017-10-28 MED ORDER — MIDAZOLAM HCL 2 MG/2ML IJ SOLN
1.0000 mg | Freq: Once | INTRAMUSCULAR | Status: AC
  Administered 2017-10-28: 1 mg via INTRAVENOUS

## 2017-10-28 MED ORDER — GLYCOPYRROLATE 0.2 MG/ML IJ SOLN
INTRAMUSCULAR | Status: DC | PRN
  Administered 2017-10-28 (×3): .2 mg via INTRAVENOUS
  Administered 2017-10-28: 0.2 mg via INTRAVENOUS

## 2017-10-28 MED ORDER — BUPIVACAINE HCL 0.25 % IJ SOLN
INTRAMUSCULAR | Status: DC | PRN
  Administered 2017-10-28: 20 mL

## 2017-10-28 MED ORDER — VASOPRESSIN 20 UNIT/ML IV SOLN
INTRAVENOUS | Status: AC
  Filled 2017-10-28: qty 1

## 2017-10-28 MED ORDER — SUCCINYLCHOLINE CHLORIDE 20 MG/ML IJ SOLN
INTRAMUSCULAR | Status: AC
  Filled 2017-10-28: qty 1

## 2017-10-28 MED ORDER — LIDOCAINE HCL (PF) 1 % IJ SOLN
INTRAMUSCULAR | Status: AC
  Filled 2017-10-28: qty 5

## 2017-10-28 MED ORDER — LIDOCAINE HCL (CARDIAC) 20 MG/ML IV SOLN
INTRAVENOUS | Status: DC | PRN
  Administered 2017-10-28: 60 mg via INTRAVENOUS

## 2017-10-28 MED ORDER — BUPIVACAINE LIPOSOME 1.3 % IJ SUSP
INTRAMUSCULAR | Status: DC | PRN
  Administered 2017-10-28: 7 mL via PERINEURAL
  Administered 2017-10-28: 13 mL via PERINEURAL

## 2017-10-28 MED ORDER — OXYCODONE HCL 5 MG PO TABS: 5.0000 mg | ORAL_TABLET | ORAL | 0 refills | Status: DC | PRN

## 2017-10-28 MED ORDER — NEOMYCIN-POLYMYXIN B GU 40-200000 IR SOLN
Status: DC | PRN
  Administered 2017-10-28: 2 mL

## 2017-10-28 MED ORDER — PHENYLEPHRINE HCL 10 MG/ML IJ SOLN
INTRAMUSCULAR | Status: AC
  Filled 2017-10-28: qty 1

## 2017-10-28 MED ORDER — PROPOFOL 10 MG/ML IV BOLUS
INTRAVENOUS | Status: AC
  Filled 2017-10-28: qty 20

## 2017-10-28 MED ORDER — LACTATED RINGERS IV SOLN
INTRAVENOUS | Status: DC
  Administered 2017-10-28 (×2): via INTRAVENOUS

## 2017-10-28 MED ORDER — SUCCINYLCHOLINE CHLORIDE 20 MG/ML IJ SOLN
INTRAMUSCULAR | Status: DC | PRN
  Administered 2017-10-28: 100 mg via INTRAVENOUS

## 2017-10-28 MED ORDER — CEFAZOLIN SODIUM-DEXTROSE 2-4 GM/100ML-% IV SOLN
INTRAVENOUS | Status: AC
  Filled 2017-10-28: qty 100

## 2017-10-28 MED ORDER — VASOPRESSIN 20 UNIT/ML IV SOLN
INTRAVENOUS | Status: DC | PRN
  Administered 2017-10-28: 2 [IU] via INTRAVENOUS
  Administered 2017-10-28: 3 [IU] via INTRAVENOUS
  Administered 2017-10-28: 1 [IU] via INTRAVENOUS
  Administered 2017-10-28 (×4): 2 [IU] via INTRAVENOUS

## 2017-10-28 MED ORDER — EPINEPHRINE 30 MG/30ML IJ SOLN
INTRAMUSCULAR | Status: AC
  Filled 2017-10-28: qty 1

## 2017-10-28 MED ORDER — EPHEDRINE SULFATE 50 MG/ML IJ SOLN
INTRAMUSCULAR | Status: DC | PRN
  Administered 2017-10-28 (×5): 10 mg via INTRAVENOUS

## 2017-10-28 MED ORDER — ROCURONIUM BROMIDE 50 MG/5ML IV SOLN
INTRAVENOUS | Status: AC
  Filled 2017-10-28: qty 1

## 2017-10-28 MED ORDER — BUPIVACAINE HCL (PF) 0.5 % IJ SOLN
INTRAMUSCULAR | Status: DC | PRN
  Administered 2017-10-28: 4 mL via PERINEURAL
  Administered 2017-10-28: 6 mL via PERINEURAL

## 2017-10-28 MED ORDER — MIDAZOLAM HCL 2 MG/2ML IJ SOLN
INTRAMUSCULAR | Status: AC
  Filled 2017-10-28: qty 2

## 2017-10-28 MED ORDER — FENTANYL CITRATE (PF) 100 MCG/2ML IJ SOLN: 25.0000 ug | INTRAMUSCULAR | Status: DC | PRN

## 2017-10-28 MED ORDER — DEXAMETHASONE SODIUM PHOSPHATE 10 MG/ML IJ SOLN
INTRAMUSCULAR | Status: DC | PRN
  Administered 2017-10-28: 10 mg via INTRAVENOUS

## 2017-10-28 MED ORDER — PHENYLEPHRINE HCL 10 MG/ML IJ SOLN
INTRAMUSCULAR | Status: DC | PRN
  Administered 2017-10-28: 200 ug via INTRAVENOUS
  Administered 2017-10-28: 100 ug via INTRAVENOUS
  Administered 2017-10-28 (×2): 200 ug via INTRAVENOUS

## 2017-10-28 MED ORDER — EPHEDRINE SULFATE 50 MG/ML IJ SOLN
INTRAMUSCULAR | Status: AC
  Filled 2017-10-28: qty 1

## 2017-10-28 MED ORDER — FENTANYL CITRATE (PF) 100 MCG/2ML IJ SOLN
50.0000 ug | Freq: Once | INTRAMUSCULAR | Status: AC
  Administered 2017-10-28: 50 ug via INTRAVENOUS

## 2017-10-28 MED ORDER — ATROPINE SULFATE 0.4 MG/ML IJ SOLN
INTRAMUSCULAR | Status: AC
  Filled 2017-10-28: qty 1

## 2017-10-28 MED ORDER — OXYCODONE HCL 5 MG PO TABS: 5.0000 mg | ORAL_TABLET | Freq: Once | ORAL | Status: DC | PRN

## 2017-10-28 MED ORDER — ROPIVACAINE HCL 5 MG/ML IJ SOLN
INTRAMUSCULAR | Status: AC
  Filled 2017-10-28: qty 30

## 2017-10-28 SURGICAL SUPPLY — 81 items
ADAPTER IRRIG TUBE 2 SPIKE SOL (ADAPTER) ×6 IMPLANT
ADPR TBG 2 SPK PMP STRL ASCP (ADAPTER) ×2
ANCH SUT Q-FX 2.8 (Anchor) ×2 IMPLANT
ANCHOR ALL-SUT Q-FIX 2.8 (Anchor) ×6 IMPLANT
BUR RADIUS 4.0X18.5 (BURR) ×3 IMPLANT
BUR RADIUS 5.5 (BURR) ×3 IMPLANT
CANISTER SUCT LVC 12 LTR MEDI- (MISCELLANEOUS) IMPLANT
CANNULA 5.75X7 CRYSTAL CLEAR (CANNULA) ×6 IMPLANT
CANNULA PARTIAL THREAD 2X7 (CANNULA) ×3 IMPLANT
CANNULA TWIST IN 8.25X9CM (CANNULA) IMPLANT
CLOSURE WOUND 1/2 X4 (GAUZE/BANDAGES/DRESSINGS) ×2
CONNECTOR PERFECT PASSER (CONNECTOR) ×3 IMPLANT
COOLER POLAR GLACIER W/PUMP (MISCELLANEOUS) ×3 IMPLANT
CRADLE LAMINECT ARM (MISCELLANEOUS) ×3 IMPLANT
DEVICE SUCT BLK HOLE OR FLOOR (MISCELLANEOUS) ×6 IMPLANT
DRAPE IMP U-DRAPE 54X76 (DRAPES) ×6 IMPLANT
DRAPE INCISE IOBAN 66X45 STRL (DRAPES) ×3 IMPLANT
DRAPE SHEET LG 3/4 BI-LAMINATE (DRAPES) ×3 IMPLANT
DRAPE U-SHAPE 47X51 STRL (DRAPES) IMPLANT
DURAPREP 26ML APPLICATOR (WOUND CARE) ×12 IMPLANT
ELECT REM PT RETURN 9FT ADLT (ELECTROSURGICAL) ×3
ELECTRODE REM PT RTRN 9FT ADLT (ELECTROSURGICAL) ×1 IMPLANT
GAUZE PETRO XEROFOAM 1X8 (MISCELLANEOUS) ×3 IMPLANT
GAUZE SPONGE 4X4 12PLY STRL (GAUZE/BANDAGES/DRESSINGS) ×6 IMPLANT
GLOVE BIOGEL PI IND STRL 7.5 (GLOVE) ×6 IMPLANT
GLOVE BIOGEL PI IND STRL 8 (GLOVE) ×2 IMPLANT
GLOVE BIOGEL PI IND STRL 9 (GLOVE) IMPLANT
GLOVE BIOGEL PI INDICATOR 7.5 (GLOVE) ×12
GLOVE BIOGEL PI INDICATOR 8 (GLOVE) ×4
GLOVE BIOGEL PI INDICATOR 9 (GLOVE)
GLOVE SURG 9.0 ORTHO LTXF (GLOVE) IMPLANT
GLOVE SURG SYN 7.0 (GLOVE) ×6 IMPLANT
GLOVE SURG SYN 9.0  PF PI (GLOVE) ×6
GLOVE SURG SYN 9.0 PF PI (GLOVE) ×3 IMPLANT
GOWN STRL REUS TWL 2XL XL LVL4 (GOWN DISPOSABLE) ×3 IMPLANT
GOWN STRL REUS W/ TWL LRG LVL3 (GOWN DISPOSABLE) ×2 IMPLANT
GOWN STRL REUS W/ TWL LRG LVL4 (GOWN DISPOSABLE) ×1 IMPLANT
GOWN STRL REUS W/TWL LRG LVL3 (GOWN DISPOSABLE) ×6
GOWN STRL REUS W/TWL LRG LVL4 (GOWN DISPOSABLE) ×3
IV LACTATED RINGER IRRG 3000ML (IV SOLUTION) ×30
IV LR IRRIG 3000ML ARTHROMATIC (IV SOLUTION) ×10 IMPLANT
KIT RM TURNOVER STRD PROC AR (KITS) ×3 IMPLANT
KIT STABILIZATION SHOULDER (MISCELLANEOUS) ×3 IMPLANT
KIT SUTURE 2.8 Q-FIX DISP (MISCELLANEOUS) ×3 IMPLANT
KIT SUTURETAK 3.0 INSERT PERC (KITS) IMPLANT
MANIFOLD NEPTUNE II (INSTRUMENTS) ×3 IMPLANT
MASK FACE SPIDER DISP (MASK) ×3 IMPLANT
MAT BLUE FLOOR 46X72 FLO (MISCELLANEOUS) ×6 IMPLANT
NDL SAFETY 18GX1.5 (NEEDLE) ×3 IMPLANT
NDL SAFETY 22GX1.5 (NEEDLE) ×3 IMPLANT
NS IRRIG 500ML POUR BTL (IV SOLUTION) ×3 IMPLANT
PACK ARTHROSCOPY SHOULDER (MISCELLANEOUS) ×3 IMPLANT
PAD ABD DERMACEA PRESS 5X9 (GAUZE/BANDAGES/DRESSINGS) ×6 IMPLANT
PAD WRAPON POLAR SHDR XLG (MISCELLANEOUS) ×1 IMPLANT
PASSER SUT CAPTURE FIRST (SUTURE) ×3 IMPLANT
SET TUBE SUCT SHAVER OUTFL 24K (TUBING) ×3 IMPLANT
SET TUBE TIP INTRA-ARTICULAR (MISCELLANEOUS) ×3 IMPLANT
STRAP SAFETY BODY (MISCELLANEOUS) ×6 IMPLANT
STRIP CLOSURE SKIN 1/2X4 (GAUZE/BANDAGES/DRESSINGS) ×4 IMPLANT
SUT ETHILON 4-0 (SUTURE) ×3
SUT ETHILON 4-0 FS2 18XMFL BLK (SUTURE) ×1
SUT KNTLS 2.8 MAGNUM (Anchor) ×12 IMPLANT
SUT LASSO 90 DEG SD STR (SUTURE) IMPLANT
SUT MNCRL 4-0 (SUTURE) ×3
SUT MNCRL 4-0 27XMFL (SUTURE) ×1
SUT PDS AB 0 CT1 27 (SUTURE) ×3 IMPLANT
SUT PERFECTPASSER WHITE CART (SUTURE) ×6 IMPLANT
SUT SMART STITCH CARTRIDGE (SUTURE) ×9 IMPLANT
SUT VIC AB 0 CT1 36 (SUTURE) ×3 IMPLANT
SUT VIC AB 2-0 CT2 27 (SUTURE) ×3 IMPLANT
SUTURE ETHLN 4-0 FS2 18XMF BLK (SUTURE) ×1 IMPLANT
SUTURE MAGNUM WIRE 2X48 BLK (SUTURE) IMPLANT
SUTURE MNCRL 4-0 27XMF (SUTURE) ×1 IMPLANT
SYR 3ML 18GX1 1/2 (SYRINGE) ×3 IMPLANT
SYRINGE 10CC LL (SYRINGE) ×3 IMPLANT
TAPE MICROFOAM 4IN (TAPE) ×3 IMPLANT
TUBING ARTHRO INFLOW-ONLY STRL (TUBING) ×3 IMPLANT
TUBING CONNECTING 10 (TUBING) ×2 IMPLANT
TUBING CONNECTING 10' (TUBING) ×1
WAND HAND CNTRL MULTIVAC 90 (MISCELLANEOUS) ×3 IMPLANT
WRAPON POLAR PAD SHDR XLG (MISCELLANEOUS) ×3

## 2017-10-28 NOTE — Anesthesia Postprocedure Evaluation (Signed)
Anesthesia Post Note  Patient: Nicholas Bonilla  Procedure(s) Performed: SHOULDER ARTHROSCOPY WITH OPEN ROTATOR CUFF REPAIR, SUBACROMIANAL DECOMPRESSION AND DISTAL CLAVICLE EXCISION (Right Shoulder)  Patient location during evaluation: PACU Anesthesia Type: General Level of consciousness: awake and alert Pain management: pain level controlled Vital Signs Assessment: post-procedure vital signs reviewed and stable Respiratory status: spontaneous breathing, nonlabored ventilation, respiratory function stable and patient connected to nasal cannula oxygen Cardiovascular status: blood pressure returned to baseline and stable Postop Assessment: no apparent nausea or vomiting Anesthetic complications: no Comments: Patient should avoid or have a reduced dose of beta blockers the morning of surgery      Last Vitals:  Vitals:   10/28/17 1243 10/28/17 1328  BP: 106/73 116/68  Pulse: 69 97  Resp: 18 18  Temp: (!) 36.4 C   SpO2: 94% 96%    Last Pain:  Vitals:   10/28/17 1328  TempSrc:   PainSc: 0-No pain                 Cleda MccreedyJoseph K Ilo Beamon

## 2017-10-28 NOTE — Transfer of Care (Signed)
Immediate Anesthesia Transfer of Care Note  Patient: Nicholas Bonilla  Procedure(s) Performed: SHOULDER ARTHROSCOPY WITH OPEN ROTATOR CUFF REPAIR, SUBACROMIANAL DECOMPRESSION AND DISTAL CLAVICLE EXCISION (Right Shoulder)  Patient Location: PACU  Anesthesia Type:GA combined with regional for post-op pain  Level of Consciousness: awake, drowsy and patient cooperative  Airway & Oxygen Therapy: Patient Spontanous Breathing and Patient connected to face mask oxygen  Post-op Assessment: Report given to RN and Post -op Vital signs reviewed and stable  Post vital signs: Reviewed and stable  Last Vitals:  Vitals:   10/28/17 0734 10/28/17 0739  BP: 133/85 (!) 131/91  Pulse: (!) 51 (!) 59  Resp: 19 16  Temp:    SpO2: 98% 97%    Last Pain:  Vitals:   10/28/17 1056  TempSrc:   PainSc: 0-No pain         Complications: No apparent anesthesia complications

## 2017-10-28 NOTE — OR Nursing (Signed)
Patient moving air well chest clear

## 2017-10-28 NOTE — Anesthesia Procedure Notes (Signed)
Anesthesia Regional Block: Interscalene brachial plexus block   Pre-Anesthetic Checklist: ,, timeout performed, Correct Patient, Correct Site, Correct Laterality, Correct Procedure, Correct Position, site marked, Risks and benefits discussed,  Surgical consent,  Pre-op evaluation,  At surgeon's request and post-op pain management  Laterality: Upper and Right  Prep: chloraprep       Needles:  Injection technique: Single-shot  Needle Type: Stimiplex     Needle Length: 5cm  Needle Gauge: 22     Additional Needles:   Narrative:  Start time: 10/28/2017 7:17 AM End time: 10/28/2017 7:19 AM Injection made incrementally with aspirations every 5 mL.  Performed by: Personally  Anesthesiologist: Liliann File, Cleda MccreedyJoseph K, MD  Additional Notes: Baseline weakness in right shoulder  Functioning IV was confirmed and monitors were applied.  A 50mm 22ga Stimuplex needle was used. Sterile prep,hand hygiene and sterile gloves were used.  Minimal sedation used for procedure.  No paresthesia endorsed by patient during the procedure.  Negative aspiration and negative test dose prior to incremental administration of local anesthetic. The patient tolerated the procedure well with no immediate complications.

## 2017-10-28 NOTE — OR Nursing (Signed)
Pt 's wife is ill, this RN took pt to the car and did d instructions with wife after transferring pt to front seat.

## 2017-10-28 NOTE — Discharge Instructions (Signed)
AMBULATORY SURGERY  DISCHARGE INSTRUCTIONS   1) The drugs that you were given will stay in your system until tomorrow so for the next 24 hours you should not:  A) Drive an automobile B) Make any legal decisions C) Drink any alcoholic beverage   2) You may resume regular meals tomorrow.  Today it is better to start with liquids and gradually work up to solid foods.  You may eat anything you prefer, but it is better to start with liquids, then soup and crackers, and gradually work up to solid foods.   3) Please notify your doctor immediately if you have any unusual bleeding, trouble breathing, redness and pain at the surgery site, drainage, fever, or pain not relieved by medication.    4) Additional Instructions:See instructions for block.        Please contact your physician with any problems or Same Day Surgery at [phone], Monday through Friday 6 am to 4 pm, or [place] at [facility] Main number at [phone].

## 2017-10-28 NOTE — Op Note (Signed)
The patient has been re-examined, and the chart reviewed, and there have been no interval changes to the documented history and physical.    The risks, benefits, and alternatives have been discussed at length, and the patient is willing to proceed.   

## 2017-10-28 NOTE — Anesthesia Post-op Follow-up Note (Signed)
Anesthesia QCDR form completed.        

## 2017-10-28 NOTE — Anesthesia Procedure Notes (Signed)
Procedure Name: Intubation Date/Time: 10/28/2017 7:53 AM Performed by: Dava NajjarFrazier, Donnie Gedeon, CRNA Pre-anesthesia Checklist: Patient identified, Emergency Drugs available, Suction available, Patient being monitored and Timeout performed Patient Re-evaluated:Patient Re-evaluated prior to induction Oxygen Delivery Method: Circle system utilized Preoxygenation: Pre-oxygenation with 100% oxygen Induction Type: IV induction Ventilation: Mask ventilation without difficulty Laryngoscope Size: Miller and 2 Grade View: Grade I Tube type: Oral Tube size: 8.0 mm Number of attempts: 1 Airway Equipment and Method: Stylet Placement Confirmation: ETT inserted through vocal cords under direct vision,  positive ETCO2 and breath sounds checked- equal and bilateral Secured at: 24 cm Tube secured with: Tape Dental Injury: Teeth and Oropharynx as per pre-operative assessment

## 2017-10-28 NOTE — Anesthesia Preprocedure Evaluation (Signed)
Anesthesia Evaluation  Patient identified by MRN, date of birth, ID band Patient awake    Reviewed: Allergy & Precautions, H&P , NPO status , Patient's Chart, lab work & pertinent test results  History of Anesthesia Complications Negative for: history of anesthetic complications  Airway Mallampati: III  TM Distance: >3 FB Neck ROM: full    Dental  (+) Chipped, Poor Dentition, Missing   Pulmonary neg shortness of breath, former smoker,           Cardiovascular Exercise Tolerance: Good hypertension, (-) angina+ Peripheral Vascular Disease  (-) Past MI and (-) DOE      Neuro/Psych PSYCHIATRIC DISORDERS Anxiety negative neurological ROS     GI/Hepatic Neg liver ROS, PUD, neg GERD  ,  Endo/Other  negative endocrine ROS  Renal/GU      Musculoskeletal  (+) Arthritis ,   Abdominal   Peds  Hematology negative hematology ROS (+)   Anesthesia Other Findings Past Medical History: No date: ADD (attention deficit disorder with hyperactivity) No date: Arthritis No date: History of blood clots     Comment:  Left Leg No date: History of kidney stones No date: Hypertension No date: OCD (obsessive compulsive disorder) No date: Ulcerative colitis  Past Surgical History: No date: COLONOSCOPY     Comment:   multiple ;Dr Mechele CollinElliott No date: HERNIA REPAIR 04/10/2010: INGUINAL HERNIA REPAIR     Comment:  Bilateral No date: LITHOTRIPSY     Comment:  x2; Dr Jamelle RushingStioff, [place] No date: TONSILLECTOMY     Reproductive/Obstetrics negative OB ROS                             Anesthesia Physical Anesthesia Plan  ASA: III  Anesthesia Plan: General ETT   Post-op Pain Management: GA combined w/ Regional for post-op pain   Induction: Intravenous  PONV Risk Score and Plan: 2 and Ondansetron, Dexamethasone and Midazolam  Airway Management Planned: Oral ETT  Additional Equipment:   Intra-op Plan:    Post-operative Plan: Extubation in OR  Informed Consent: I have reviewed the patients History and Physical, chart, labs and discussed the procedure including the risks, benefits and alternatives for the proposed anesthesia with the patient or authorized representative who has indicated his/her understanding and acceptance.   Dental Advisory Given  Plan Discussed with: Anesthesiologist, CRNA and Surgeon  Anesthesia Plan Comments: (Patient consented for risks of anesthesia including but not limited to:  - adverse reactions to medications - damage to teeth, lips or other oral mucosa - sore throat or hoarseness - Damage to heart, brain, lungs or loss of life  Patient voiced understanding.)        Anesthesia Quick Evaluation

## 2017-10-29 ENCOUNTER — Other Ambulatory Visit: Payer: Self-pay

## 2017-11-08 ENCOUNTER — Other Ambulatory Visit: Payer: Self-pay | Admitting: Orthopedic Surgery

## 2017-11-08 DIAGNOSIS — M79662 Pain in left lower leg: Secondary | ICD-10-CM

## 2017-11-08 DIAGNOSIS — M7989 Other specified soft tissue disorders: Secondary | ICD-10-CM

## 2017-11-09 ENCOUNTER — Encounter (INDEPENDENT_AMBULATORY_CARE_PROVIDER_SITE_OTHER): Payer: BLUE CROSS/BLUE SHIELD

## 2017-11-09 ENCOUNTER — Ambulatory Visit
Admission: RE | Admit: 2017-11-09 | Discharge: 2017-11-09 | Disposition: A | Discharge status: Home / Self Care | Payer: BLUE CROSS/BLUE SHIELD | Source: Ambulatory Visit | Attending: Orthopedic Surgery | Admitting: Orthopedic Surgery

## 2017-11-09 DIAGNOSIS — M79662 Pain in left lower leg: Secondary | ICD-10-CM | POA: Diagnosis present

## 2017-11-09 DIAGNOSIS — I82432 Acute embolism and thrombosis of left popliteal vein: Secondary | ICD-10-CM | POA: Diagnosis not present

## 2017-11-09 DIAGNOSIS — I82412 Acute embolism and thrombosis of left femoral vein: Secondary | ICD-10-CM | POA: Diagnosis not present

## 2017-11-09 DIAGNOSIS — M7989 Other specified soft tissue disorders: Secondary | ICD-10-CM | POA: Insufficient documentation

## 2017-11-10 NOTE — Op Note (Signed)
10/28/2017  5:50 PM  PATIENT:  Nicholas Bonilla  56 y.o. male  PRE-OPERATIVE DIAGNOSIS:  Full thickness tear of right shoulder rotator cuff tear, with subacromial impingement and acromioclavicular joint arthrosis  POST-OPERATIVE DIAGNOSIS:  Full thickness tear of right shoulder rotator cuff tear, with subacromial impingement and acromioclavicular joint arthrosis  PROCEDURE:  Procedure(s): RIGHT SHOULDER ARTHROSCOPIC SUBACROMIANAL DECOMPRESSION AND DISTAL CLAVICLE EXCISION WITH MINI-OPEN ROTATOR CUFF REPAIR   SURGEON:  Surgeon(s) and Role:    Thornton Park, MD - Primary  ANESTHESIA:   local, general and paracervical block   PREOPERATIVE INDICATIONS:  Nicholas Bonilla is a  56 y.o. male with a diagnosis of M75.121 Complete rotatr-cuff tear/ruptr of r shoulder, not trauma failed conservative treatment and elected for surgical management.    The risks benefits and alternatives were discussed with the patient preoperatively including but not limited to the risks of infection, bleeding, nerve injury, persistent pain or weakness, failure of the hardware, re-tear of the rotator cuff and the need for further surgery. Medical risks include DVT and pulmonary embolism, myocardial infarction, stroke, pneumonia, respiratory failure and death. Patient understood these risks and wished to proceed.  OPERATIVE IMPLANTS: ArthroCare Magnum 2 anchors x 4 & Smith and Nephew Q Fix anchors x 2  OPERATIVE FINDINGS: Full thickness and retracted tear of the right should supra and infraspinatus tendons.  OPERATIVE PROCEDURE: The patient was met in the preoperative area. The right shoulder was signed with the word yes and my initials according the hospital's correct site of surgery protocol.   History and physical was updated.  Patient underwent an interscalene block by the anesthesia service.  Patient was brought to the operating room where he underwent general anesthesia.  The patient was placed in a beachchair  position.  A spider arm positioner was used for this case. Examination under anesthesia revealed no limitation of motion or instability with load shift testing. The patient had a negative sulcus sign.  Patient was prepped and draped in a sterile fashion. A timeout was performed to verify the patient's name, date of birth, medical record number, correct site of surgery and correct procedure to be performed there was also used to verify the patient received antibiotics that all appropriate instruments, implants and radiographs studies were available in the room. Once all in attendance were in agreement case began.  Bony landmarks were drawn out with a surgical marker along with proposed arthroscopy incisions.   An 11 blade was used to establish a posterior portal through which the arthroscope was placed in the glenohumeral joint. A full diagnostic examination of the shoulder was performed.  Patient had a full thickness and retracted tear of the supra and infraspinatus tendons.   A lateral portal was made and a lysis of adhesions was performed with a 4.0 resector shaver blade to free the tendons of scar tissue.  Arthroscopic elevator was then used to mobilize the rotator cuff.  A Theador Hawthorne was then used to grasp the lateral edge of the rotator cuff and it was deemed repairable.  A 5.5 mm resector shaver blade was then used to debride the greater tuberosity of all torn fibers of the rotator cuff until punctate bleeding was identified.    The arthroscope was then placed in the subacromial space. Extensive bursitis was encountered and debrided using a 4-0 resector shaver blade and a 90 ArthroCare wand from the lateral portal   A subacromial decompression was also performed using a 5.5 mm resector shaver blade from  the lateral portal. The 5.5 shaver was then placed through the anterior portal and a distal clavicle excision was performed.  Four Perfect Pass sutures were placed in the lateral border of the rotator  cuff tear. All arthroscopic instruments were then removed and the mini-open portion of the procedure began.    A saber-type incision was made along the lateral border of the acromion. The deltoid muscle was identified and split in line with its fibers which allowed visualization of the rotator cuff. The Perfect Pass sutures previously placed in the lateral border of the rotator cuff were brought out through the deltoid split.  Two Smith and [employer] Fix anchors were placed at the articular margin of the humeral head with the greater tuberosity. The suture limbs of the Q Fix anchors were passed medially through the rotator cuff using a First Pass suture passer.   The four Perfect Pass sutures were then anchored to the greater tuberosity footprint using four Magnum 2 anchors. These anchors were tensioned to allow for anatomic reduction of the rotator cuff to the greater tuberosity. The medial row sutures were then tied down using an arthroscopic knot tying technique.  Arthroscopic images of the repair were taken with the arthroscope both externally and from inside the glenohumeral joint.  All incisions were copiously irrigated. The deltoid fascia was repaired using a 0 Vicryl suture.  The subcutaneous tissue of all incisions were closed with a 2-0 Vicryl. Skin closure for the arthroscopic incisions was performed with 4-0 nylon. The skin edges of the saber incision was approximated with a running 4-0 undyed Monocryl.  0.25% Marcaine plain was then injected into the subacromial space for postoperative pain control. A dry sterile dressing was applied.  The patient was placed in an abduction sling and a Polar Care was applied to the shoulder.  All sharp and it instrument counts were correct at the conclusion of the case. I was scrubbed and present for the entire case. I spoke with the patient's wife postoperatively to let her know the case had gone without complication and the patient was stable in recovery room.

## 2017-11-10 NOTE — H&P (Signed)
The patient has been re-examined, and the chart reviewed, and there have been no interval changes to the documented history and physical.    The risks, benefits, and alternatives have been discussed at length, and the patient is willing to proceed.   

## 2017-11-18 ENCOUNTER — Encounter (INDEPENDENT_AMBULATORY_CARE_PROVIDER_SITE_OTHER): Payer: Self-pay | Admitting: Vascular Surgery

## 2017-11-18 ENCOUNTER — Ambulatory Visit (INDEPENDENT_AMBULATORY_CARE_PROVIDER_SITE_OTHER): Payer: BLUE CROSS/BLUE SHIELD | Admitting: Vascular Surgery

## 2017-11-18 VITALS — BP 129/85 | HR 63 | Resp 17 | Ht 71.0 in | Wt 219.0 lb

## 2017-11-18 DIAGNOSIS — I82412 Acute embolism and thrombosis of left femoral vein: Secondary | ICD-10-CM

## 2017-11-18 DIAGNOSIS — D6851 Activated protein C resistance: Secondary | ICD-10-CM

## 2017-11-18 DIAGNOSIS — E785 Hyperlipidemia, unspecified: Secondary | ICD-10-CM | POA: Diagnosis not present

## 2017-11-18 NOTE — Progress Notes (Signed)
Subjective:    Patient ID: Nicholas Bonilla, male    DOB: 10-16-1961, 56 y.o.   MRN: [id number] Chief Complaint  Patient presents with  . Follow-up    positive DVT   Presents as a new patient referred by Dr. Mack Guise for a left lower extremity DVT.  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  There is a remote history of the patient being seen by a hematologist - informs that he was not diagnosed with any bleeding or clotting disorders however epic does list factor V Leiden as a diagnosis.  The patient underwent a left lower extremity venous duplex on November 08, 2017 and was found to have a subacute DVT in the femoral and popliteal veins.  The patient underwent a right shoulder surgery approximately a week before he started to experience pain and swelling in the left leg.  This is what led to the left lower extremity duplex.  The patient's discomfort and swelling have improved over the last few weeks.  He does wear compression stockings and elevate his legs.  The patient was placed on Eliquis 5 mg 1 tab twice daily.  He continues to take this medication.  The patient denies any shortness of breath or chest pain.  Patient denies any fever, nausea vomiting.   Review of Systems  Constitutional: Negative.   HENT: Negative.   Eyes: Negative.   Respiratory: Negative.   Cardiovascular: Positive for leg swelling.       Left lower extremity DVT Current DVT  Gastrointestinal: Negative.   Endocrine: Negative.   Genitourinary: Negative.   Musculoskeletal: Negative.   Skin: Negative.   Allergic/Immunologic: Negative.   Neurological: Negative.   Hematological: Negative.   Psychiatric/Behavioral: Negative.       Objective:   Physical Exam  Constitutional: He is oriented to person, place, and time. He appears well-developed and well-nourished. No distress.  HENT:  Head: Normocephalic and atraumatic.  Eyes: Conjunctivae are  normal. Pupils are equal, round, and reactive to light.  Neck: Normal range of motion.  Cardiovascular: Normal rate, regular rhythm, normal heart sounds and intact distal pulses.  Pulses:      Radial pulses are 2+ on the right side, and 2+ on the left side.       Dorsalis pedis pulses are 2+ on the right side, and 2+ on the left side.       Posterior tibial pulses are 2+ on the right side, and 2+ on the left side.  Left lower extremity: No pain with palpation.  No pain with dorsiflexion.  There is no acute vascular compromise to the left lower extremity.  Calf is soft.  Thigh is soft.  Pulmonary/Chest: Effort normal and breath sounds normal.  Musculoskeletal: Normal range of motion. He exhibits edema (Left mild lower extremity edema noted).  Neurological: He is alert and oriented to person, place, and time.  Skin: Skin is warm and dry. He is not diaphoretic.  Psychiatric: He has a normal mood and affect. His behavior is normal. Judgment and thought content normal.  Vitals reviewed.  BP 129/85 (BP Location: Left Arm)   Pulse 63   Resp 17   Ht _0  (1.803 m)   Wt 219 lb (99.3 kg)   BMI 30.54 kg/m   Past Medical History:  Diagnosis Date  . ADD (attention deficit disorder with hyperactivity)   . Arthritis   . History of blood clots  Left Leg  . History of kidney stones   . Hypertension   . OCD (obsessive compulsive disorder)   . Ulcerative colitis    Social History   Socioeconomic History  . Marital status: Married    Spouse name: Not on file  . Number of children: Not on file  . Years of education: Not on file  . Highest education level: Not on file  Social Needs  . Financial resource strain: Not on file  . Food insecurity - worry: Not on file  . Food insecurity - inability: Not on file  . Transportation needs - medical: Not on file  . Transportation needs - non-medical: Not on file  Occupational History  . Not on file  Tobacco Use  . Smoking status: Former Smoker      Types: Cigarettes    Last attempt to quit: 12/14/1984    Years since quitting: 32.9  . Smokeless tobacco: Never Used  Substance and Sexual Activity  . Alcohol use: Yes    Comment:  1 wine or  beer/ day or less  . Drug use: No  . Sexual activity: Not on file  Other Topics Concern  . Not on file  Social History Narrative  . Not on file   Past Surgical History:  Procedure Laterality Date  . COLONOSCOPY      multiple ;Dr Vira Agar  . HERNIA REPAIR    . INGUINAL HERNIA REPAIR  04/10/2010   Bilateral  . LITHOTRIPSY     x2; Dr Darlys Gales, [place]  . SHOULDER ARTHROSCOPY WITH OPEN ROTATOR CUFF REPAIR Right 10/28/2017   Procedure: SHOULDER ARTHROSCOPY WITH OPEN ROTATOR CUFF REPAIR, SUBACROMIANAL DECOMPRESSION AND DISTAL CLAVICLE EXCISION;  Surgeon: Thornton Park, MD;  Location: [facility] ORS;  Service: Orthopedics;  Laterality: Right;  . TONSILLECTOMY     Family History  Problem Relation Age of Onset  . Heart attack Father 85  . Hypertension Father   . Rheumatic fever Father   . Sudden death Father   . Cancer Sister        Larynx-smoker  . Hypertension Sister   . Heart attack Maternal Uncle        X2; > 68 yo  . Heart attack Paternal Grandfather        > 55  . Dementia Mother   . Diabetes Neg Hx   . Hyperlipidemia Neg Hx    Allergies  Allergen Reactions  . Codeine Rash and Other (See Comments)    Only in high doses  . Naproxen Sodium Other (See Comments)    REACTION: colitis flare  . Promethazine Hcl Other (See Comments)    REACTION: mental status changes  . Latex Rash      Assessment & Plan:  Presents as a new patient referred by Dr. Mack Guise for a left lower extremity DVT.  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  There is a remote history of the patient being seen by a hematologist - informs that he was not diagnosed with any bleeding or clotting disorders however epic does list factor V Leiden as a  diagnosis.  The patient underwent a left lower extremity venous duplex on November 08, 2017 and was found to have a subacute DVT in the femoral and popliteal veins.  The patient underwent a right shoulder surgery approximately a week before he started to experience pain and swelling in the left leg.  This is what led to the left  lower extremity duplex.  The patient's discomfort and swelling have improved over the last few weeks.  He does wear compression stockings and elevate his legs.  The patient was placed on Eliquis 5 mg 1 tab twice daily.  He continues to take this medication.  The patient denies any shortness of breath or chest pain.  Patient denies any fever, nausea vomiting.  1. Deep vein thrombosis (DVT) of femoral vein of left lower extremity, unspecified chronicity ([place]) - New Patient with a left lower extremity femoral and popliteal vein DVT diagnosed on November 08, 2017 after a right upper extremity shoulder surgery The patient has had 2 previous DVTs also after surgeries.  The patient states he was seen by a hematologist years ago however was never given a bleeding/clotting diagnosis.  Epic states the patient has factor V Leiden.  It may be beneficial in the future for the patient to reconsult a hematologist. The patient is to continue taking Eliquis 5 mg 1 tab by mouth twice daily He understands he will be on this for approximately 6 months The patient is to follow-up in approximately 1-2 weeks to assess for any propagation of the DVT The patient is to continue wearing compression stockings and engaging in elevation for symptomatic control The patient is to seek medical attention immediately if he should experience any shortness of breath or chest pain The patient expresses understanding  - VAS [place] LOWER EXTREMITY VENOUS (DVT); Future  2. Factor V Leiden mutation ([place]) - Stable Patient states a visit with a hematologist years ago yielded no diagnosis of a bleeding or clotting  disorder. This was stated in the epic system It may be beneficial for the patient to see hematologist in the future now that he has experienced 3 DVTs even if they were after surgical procedures  3. Hyperlipidemia, unspecified hyperlipidemia type - Stable Encouraged good control as its slows the progression of atherosclerotic disease  Current Outpatient Medications on File Prior to Visit  Medication Sig Dispense Refill  . acetaminophen (TYLENOL) 500 MG tablet Take 500-1,000 mg by mouth every 6 (six) hours as needed for moderate pain or headache.    Marland Kitchen amLODipine (NORVASC) 5 MG tablet Take 1 tablet (5 mg total) by mouth daily. 90 tablet 3  . Amphetamine ER 18.8 MG TBED Take 18.8 mg by mouth daily.    Marland Kitchen amphetamine-dextroamphetamine (ADDERALL) 30 MG tablet Take 15 mg by mouth daily.    Marland Kitchen anastrozole (ARIMIDEX) 1 MG tablet Take 1 mg by mouth every Wednesday.    Marland Kitchen apixaban (ELIQUIS) 5 MG TABS tablet 2 (two) times daily.    . balsalazide (COLAZAL) 750 MG capsule Take 2,250 mg by mouth 3 (three) times daily.    . Cholecalciferol (VITAMIN D3) 5000 units CAPS Take 5,000 Units by mouth daily.    . Coenzyme Q10 (CO Q-10) 100 MG CAPS Take 100 mg by mouth daily.    Marland Kitchen DHEA 25 MG tablet Take 25 mg by mouth daily.     . hydrocortisone (CORTEF) 10 MG tablet Take 7.5 mg by mouth daily.    . metoprolol (LOPRESSOR) 50 MG tablet Take 1 tablet (50 mg total) by mouth 2 (two) times daily. 180 tablet 3  . Multiple Vitamins-Minerals (MULTIVITAMIN PO) Take 1 tablet by mouth 2 (two) times daily.    . ondansetron (ZOFRAN) 4 MG tablet Take 1 tablet (4 mg total) every 8 (eight) hours as needed by mouth for nausea or vomiting. 30 tablet 0  . OVER THE COUNTER  MEDICATION Take 1 tablet by mouth 2 (two) times daily. Ultra Hepa Trope Supplement    . OVER THE COUNTER MEDICATION Take 1 tablet by mouth 2 (two) times daily. Inflamed Supplement    . oxyCODONE (OXY IR/ROXICODONE) 5 MG immediate release tablet Take 1 tablet (5 mg  total) every 4 (four) hours as needed by mouth for severe pain. 40 tablet 0  . tamsulosin (FLOMAX) 0.4 MG CAPS capsule Take 0.4 mg by mouth daily.    . Testosterone Cypionate 200 MG/ML KIT Inject 140 mg into the muscle See admin instructions. Inject 140 mg IM every 11 days    . venlafaxine XR (EFFEXOR-XR) 150 MG 24 hr capsule Take 225 mg by mouth daily.      No current facility-administered medications on file prior to visit.     There are no Patient Instructions on file for this visit. No Follow-up on file.   Casia Corti A Poet Hineman, PA-C

## 2017-12-02 ENCOUNTER — Ambulatory Visit (INDEPENDENT_AMBULATORY_CARE_PROVIDER_SITE_OTHER): Payer: BLUE CROSS/BLUE SHIELD | Admitting: Vascular Surgery

## 2017-12-02 ENCOUNTER — Ambulatory Visit (INDEPENDENT_AMBULATORY_CARE_PROVIDER_SITE_OTHER): Payer: BLUE CROSS/BLUE SHIELD

## 2017-12-02 ENCOUNTER — Encounter (INDEPENDENT_AMBULATORY_CARE_PROVIDER_SITE_OTHER): Payer: Self-pay | Admitting: Vascular Surgery

## 2017-12-02 VITALS — BP 138/89 | HR 66 | Resp 17 | Wt 216.6 lb

## 2017-12-02 DIAGNOSIS — D6851 Activated protein C resistance: Secondary | ICD-10-CM | POA: Diagnosis not present

## 2017-12-02 DIAGNOSIS — E785 Hyperlipidemia, unspecified: Secondary | ICD-10-CM | POA: Diagnosis not present

## 2017-12-02 DIAGNOSIS — I82412 Acute embolism and thrombosis of left femoral vein: Secondary | ICD-10-CM | POA: Diagnosis not present

## 2017-12-02 NOTE — Progress Notes (Signed)
Subjective:    Patient ID: Nicholas Bonilla, male    DOB: [birth year], 56 y.o.   MRN: [id number] Chief Complaint  Patient presents with  . Follow-up    1-2wk lle dvt   The patient presents for a one-week follow-up.  The patient presents to assess for any propagation of his left lower extremity DVT.  The patient presents today without complaint.  He denies any worsening left lower extremity pain or swelling.  The patient denies any shortness of breath or chest pain.  The patient continues to take Eliquis 5 mg 1 tab by mouth twice a day.  He understands he will be on this medication for approximately 6 months.  The patient underwent a left lower extremity venous duplex exam which was notable for a chronic deep vein thrombosis involving the distal superficial femoral and popliteal veins.  There is no propagation noted when compared to the previous exam.  The patient denies any fever, nausea vomiting.   Review of Systems  Constitutional: Negative.   HENT: Negative.   Eyes: Negative.   Respiratory: Negative.   Cardiovascular:       LLE DVT  Gastrointestinal: Negative.   Endocrine: Negative.   Genitourinary: Negative.   Musculoskeletal: Negative.   Skin: Negative.   Allergic/Immunologic: Negative.   Neurological: Negative.   Hematological: Negative.   Psychiatric/Behavioral: Negative.       Objective:   Physical Exam  Constitutional: He is oriented to person, place, and time. He appears well-developed and well-nourished.  HENT:  Head: Normocephalic and atraumatic.  Eyes: Conjunctivae are normal. Pupils are equal, round, and reactive to light.  Neck: Normal range of motion.  Cardiovascular: Normal rate, regular rhythm, normal heart sounds and intact distal pulses.  Pulses:      Radial pulses are 2+ on the right side, and 2+ on the left side.       Dorsalis pedis pulses are 2+ on the right side, and 2+ on the left side.       Posterior tibial pulses are 2+ on the right side, and 2+ on  the left side.  Left lower extremity: No pain with palpation.  No pain with dorsiflexion.  There is no acute vascular compromise to the left lower extremity.  Calf is soft.  Thigh is soft.   Pulmonary/Chest: Effort normal and breath sounds normal.  Musculoskeletal: Normal range of motion. He exhibits no edema.  Neurological: He is alert and oriented to person, place, and time.  Skin: Skin is warm and dry.  Psychiatric: He has a normal mood and affect. His behavior is normal. Judgment and thought content normal.  Vitals reviewed.  BP 138/89 (BP Location: Left Arm)   Pulse 66   Resp 17   Wt 216 lb 9.6 oz (98.2 kg)   BMI 30.21 kg/m   Past Medical History:  Diagnosis Date  . ADD (attention deficit disorder with hyperactivity)   . Arthritis   . History of blood clots    Left Leg  . History of kidney stones   . Hypertension   . OCD (obsessive compulsive disorder)   . Ulcerative colitis    Social History   Socioeconomic History  . Marital status: Married    Spouse name: Not on file  . Number of children: Not on file  . Years of education: Not on file  . Highest education level: Not on file  Social Needs  . Financial resource strain: Not on file  . Food insecurity - worry:  Not on file  . Food insecurity - inability: Not on file  . Transportation needs - medical: Not on file  . Transportation needs - non-medical: Not on file  Occupational History  . Not on file  Tobacco Use  . Smoking status: Former Smoker    Types: Cigarettes    Last attempt to quit: 12/14/1984    Years since quitting: 32.9  . Smokeless tobacco: Never Used  Substance and Sexual Activity  . Alcohol use: Yes    Comment:  1 wine or  beer/ day or less  . Drug use: No  . Sexual activity: Not on file  Other Topics Concern  . Not on file  Social History Narrative  . Not on file   Past Surgical History:  Procedure Laterality Date  . COLONOSCOPY      multiple ;Dr Vira Agar  . HERNIA REPAIR    . INGUINAL  HERNIA REPAIR  04/10/2010   Bilateral  . LITHOTRIPSY     x2; Dr Darlys Gales, [place]  . SHOULDER ARTHROSCOPY WITH OPEN ROTATOR CUFF REPAIR Right 10/28/2017   Procedure: SHOULDER ARTHROSCOPY WITH OPEN ROTATOR CUFF REPAIR, SUBACROMIANAL DECOMPRESSION AND DISTAL CLAVICLE EXCISION;  Surgeon: Thornton Park, MD;  Location: [facility] ORS;  Service: Orthopedics;  Laterality: Right;  . TONSILLECTOMY     Family History  Problem Relation Age of Onset  . Heart attack Father 12  . Hypertension Father   . Rheumatic fever Father   . Sudden death Father   . Cancer Sister        Larynx-smoker  . Hypertension Sister   . Heart attack Maternal Uncle        X2; > 50 yo  . Heart attack Paternal Grandfather        > 55  . Dementia Mother   . Diabetes Neg Hx   . Hyperlipidemia Neg Hx    Allergies  Allergen Reactions  . Codeine Rash and Other (See Comments)    Only in high doses  . Naproxen Sodium Other (See Comments)    REACTION: colitis flare  . Promethazine Hcl Other (See Comments)    REACTION: mental status changes  . Latex Rash      Assessment & Plan:  The patient presents for a one-week follow-up.  The patient presents to assess for any propagation of his left lower extremity DVT.  The patient presents today without complaint.  He denies any worsening left lower extremity pain or swelling.  The patient denies any shortness of breath or chest pain.  The patient continues to take Eliquis 5 mg 1 tab by mouth twice a day.  He understands he will be on this medication for approximately 6 months.  The patient underwent a left lower extremity venous duplex exam which was notable for a chronic deep vein thrombosis involving the distal superficial femoral and popliteal veins.  There is no propagation noted when compared to the previous exam.  The patient denies any fever, nausea vomiting.  1. Deep vein thrombosis (DVT) of femoral vein of left lower extremity, unspecified chronicity (HCC) - Stable The patient  presents today without complaint His physical exam is unremarkable There is no propagation of his left lower extremity DVT on duplex today. The patient is to continue taking Eliquis 5 mg 1 tab by mouth twice daily He understands that he may be on this medication for at least 6 months The patient is to follow-up in 3 months and undergo another left lower extremity DVT study The patient notes  that medical attention immediately if he should start experiencing shortness of breath or chest pain. He expresses understanding  - VAS [place] LOWER EXTREMITY VENOUS (DVT); Future  2. Hyperlipidemia, unspecified hyperlipidemia type - Stable Encouraged good control as its slows the progression of atherosclerotic disease  3. Factor V Leiden mutation ([place]) - Stable Patient states a visit with a hematologist years ago yielded no diagnosis of a bleeding or clotting disorder. This was stated in the epic system It may be beneficial for the patient to see hematologist in the future now that he has experienced 3 DVTs even if they were after surgical procedures  Current Outpatient Medications on File Prior to Visit  Medication Sig Dispense Refill  . acetaminophen (TYLENOL) 500 MG tablet Take 500-1,000 mg by mouth every 6 (six) hours as needed for moderate pain or headache.    Marland Kitchen amLODipine (NORVASC) 5 MG tablet Take 1 tablet (5 mg total) by mouth daily. 90 tablet 3  . Amphetamine ER 18.8 MG TBED Take 18.8 mg by mouth daily.    Marland Kitchen amphetamine-dextroamphetamine (ADDERALL) 30 MG tablet Take 15 mg by mouth daily.    Marland Kitchen anastrozole (ARIMIDEX) 1 MG tablet Take 1 mg by mouth every Wednesday.    Marland Kitchen apixaban (ELIQUIS) 5 MG TABS tablet 2 (two) times daily.    . balsalazide (COLAZAL) 750 MG capsule Take 2,250 mg by mouth 3 (three) times daily.    . Cholecalciferol (VITAMIN D3) 5000 units CAPS Take 5,000 Units by mouth daily.    . Coenzyme Q10 (CO Q-10) 100 MG CAPS Take 100 mg by mouth daily.    Marland Kitchen DHEA 25 MG tablet Take 25 mg by  mouth daily.     . hydrocortisone (CORTEF) 10 MG tablet Take 7.5 mg by mouth daily.    . metoprolol (LOPRESSOR) 50 MG tablet Take 1 tablet (50 mg total) by mouth 2 (two) times daily. 180 tablet 3  . Multiple Vitamins-Minerals (MULTIVITAMIN PO) Take 1 tablet by mouth 2 (two) times daily.    . ondansetron (ZOFRAN) 4 MG tablet Take 1 tablet (4 mg total) every 8 (eight) hours as needed by mouth for nausea or vomiting. 30 tablet 0  . OVER THE COUNTER MEDICATION Take 1 tablet by mouth 2 (two) times daily. Ultra Hepa Trope Supplement    . OVER THE COUNTER MEDICATION Take 1 tablet by mouth 2 (two) times daily. Inflamed Supplement    . oxyCODONE (OXY IR/ROXICODONE) 5 MG immediate release tablet Take 1 tablet (5 mg total) every 4 (four) hours as needed by mouth for severe pain. 40 tablet 0  . tamsulosin (FLOMAX) 0.4 MG CAPS capsule Take 0.4 mg by mouth daily.    . Testosterone Cypionate 200 MG/ML KIT Inject 140 mg into the muscle See admin instructions. Inject 140 mg IM every 11 days    . venlafaxine XR (EFFEXOR-XR) 150 MG 24 hr capsule Take 225 mg by mouth daily.      No current facility-administered medications on file prior to visit.    There are no Patient Instructions on file for this visit. No Follow-up on file.  KIMBERLY A STEGMAYER, PA-C

## 2017-12-09 ENCOUNTER — Other Ambulatory Visit (INDEPENDENT_AMBULATORY_CARE_PROVIDER_SITE_OTHER): Payer: Self-pay

## 2017-12-09 MED ORDER — APIXABAN 5 MG PO TABS: 5.0000 mg | ORAL_TABLET | Freq: Two times a day (BID) | ORAL | 5 refills | Status: AC

## 2017-12-20 ENCOUNTER — Telehealth: Payer: Self-pay

## 2017-12-20 NOTE — Telephone Encounter (Signed)
Pt walked in requesting to be seen ASAP. Pt stated on Saturday he was self pleasuring and "lost" the device into his urethra. Pt stated that he was able to push the device up into his bladder but has started seeing blood. Pt is currently taking blood thinners. Reinforced with pt to increase fluid in take. Pt requested to only see Dr. Lonna CobbStoioff. Pt was added to schedule for tomorrow.

## 2017-12-21 ENCOUNTER — Encounter: Payer: Self-pay | Admitting: Urology

## 2017-12-21 ENCOUNTER — Other Ambulatory Visit: Payer: Self-pay | Admitting: Radiology

## 2017-12-21 ENCOUNTER — Ambulatory Visit (INDEPENDENT_AMBULATORY_CARE_PROVIDER_SITE_OTHER): Payer: BLUE CROSS/BLUE SHIELD | Admitting: Urology

## 2017-12-21 VITALS — BP 155/83 | HR 91 | Ht 71.0 in | Wt 215.0 lb

## 2017-12-21 DIAGNOSIS — R339 Retention of urine, unspecified: Secondary | ICD-10-CM

## 2017-12-21 DIAGNOSIS — T191XXA Foreign body in bladder, initial encounter: Principal | ICD-10-CM

## 2017-12-21 DIAGNOSIS — T190XXA Foreign body in urethra, initial encounter: Secondary | ICD-10-CM

## 2017-12-21 MED ORDER — CEFUROXIME AXETIL 500 MG PO TABS: 500.0000 mg | ORAL_TABLET | Freq: Two times a day (BID) | ORAL | 0 refills | Status: AC

## 2017-12-21 MED ORDER — CEFAZOLIN SODIUM-DEXTROSE 2-4 GM/100ML-% IV SOLN
2.0000 g | INTRAVENOUS | Status: AC
  Administered 2017-12-22: 2 g via INTRAVENOUS

## 2017-12-21 NOTE — H&P (View-Only) (Signed)
12/21/2017 3:17 PM   [name] 07-Aug-1961 [id number]  Referring provider: Karle Starch, MD [address] [place], [place] [address]  Chief Complaint  Patient presents with  . Other    sexual device in urethra/bladder     HPI: 57 year old male followed for BPH and hypogonadism.  He states 3 days ago he was sexually stimulating himself with a solid plastic catheter and accidentally lost his grip with resultant migration of the catheter which she was unable to be retrieved.  He had no discomfort however yesterday had onset of urinary retention and has been catheterizing himself with a hollow plastic tube.  He is on tamsulosin for BPH.  PMH: Past Medical History:  Diagnosis Date  . ADD (attention deficit disorder with hyperactivity)   . Arthritis   . History of blood clots    Left Leg  . History of kidney stones   . Hypertension   . OCD (obsessive compulsive disorder)   . Ulcerative colitis     Surgical History: Past Surgical History:  Procedure Laterality Date  . COLONOSCOPY      multiple ;Dr Vira Agar  . HERNIA REPAIR    . INGUINAL HERNIA REPAIR  04/10/2010   Bilateral  . LITHOTRIPSY     x2; Dr Darlys Gales, [place]  . SHOULDER ARTHROSCOPY WITH OPEN ROTATOR CUFF REPAIR Right 10/28/2017   Procedure: SHOULDER ARTHROSCOPY WITH OPEN ROTATOR CUFF REPAIR, SUBACROMIANAL DECOMPRESSION AND DISTAL CLAVICLE EXCISION;  Surgeon: Thornton Park, MD;  Location: [facility] ORS;  Service: Orthopedics;  Laterality: Right;  . TONSILLECTOMY      Home Medications:  Allergies as of 12/21/2017      Reactions   Codeine Rash, Other (See Comments)   Only in high doses   Naproxen Sodium Other (See Comments)   REACTION: colitis flare   Promethazine Hcl Other (See Comments)   REACTION: mental status changes   Latex Rash      Medication List        Accurate as of 12/21/17  3:17 PM. Always use your most recent med list.          acetaminophen 500 MG tablet Commonly  known as:  TYLENOL Take 500-1,000 mg by mouth every 6 (six) hours as needed for moderate pain or headache.   amLODipine 5 MG tablet Commonly known as:  NORVASC Take 1 tablet (5 mg total) by mouth daily.   Amphetamine ER 18.8 MG Tbed Take 18.8 mg by mouth daily.   amphetamine-dextroamphetamine 30 MG tablet Commonly known as:  ADDERALL Take 15 mg by mouth daily.   anastrozole 1 MG tablet Commonly known as:  ARIMIDEX Take 1 mg by mouth every Wednesday.   apixaban 5 MG Tabs tablet Commonly known as:  ELIQUIS Take 1 tablet (5 mg total) by mouth 2 (two) times daily.   balsalazide 750 MG capsule Commonly known as:  COLAZAL Take 2,250 mg by mouth 3 (three) times daily.   Co Q-10 100 MG Caps Take 100 mg by mouth daily.   DHEA 25 MG tablet Take 25 mg by mouth daily.   hydrocortisone 10 MG tablet Commonly known as:  CORTEF Take 7.5 mg by mouth daily.   metoprolol tartrate 50 MG tablet Commonly known as:  LOPRESSOR Take 1 tablet (50 mg total) by mouth 2 (two) times daily.   MULTIVITAMIN PO Take 1 tablet by mouth 2 (two) times daily.   ondansetron 4 MG tablet Commonly known as:  ZOFRAN Take 1 tablet (4 mg total)  every 8 (eight) hours as needed by mouth for nausea or vomiting.   OVER THE COUNTER MEDICATION Take 1 tablet by mouth 2 (two) times daily. Ultra Hepa Trope Supplement   OVER THE COUNTER MEDICATION Take 1 tablet by mouth 2 (two) times daily. Inflamed Supplement   oxyCODONE 5 MG immediate release tablet Commonly known as:  Oxy IR/ROXICODONE Take 1 tablet (5 mg total) every 4 (four) hours as needed by mouth for severe pain.   tamsulosin 0.4 MG Caps capsule Commonly known as:  FLOMAX Take 0.4 mg by mouth daily.   Testosterone Cypionate 200 MG/ML Kit Inject 140 mg into the muscle See admin instructions. Inject 140 mg IM every 11 days   venlafaxine XR 150 MG 24 hr capsule Commonly known as:  EFFEXOR-XR Take 225 mg by mouth daily.   Vitamin D3 5000 units  Caps Take 5,000 Units by mouth daily.       Allergies:  Allergies  Allergen Reactions  . Codeine Rash and Other (See Comments)    Only in high doses  . Naproxen Sodium Other (See Comments)    REACTION: colitis flare  . Promethazine Hcl Other (See Comments)    REACTION: mental status changes  . Latex Rash    Family History: Family History  Problem Relation Age of Onset  . Heart attack Father 43  . Hypertension Father   . Rheumatic fever Father   . Sudden death Father   . Cancer Sister        Larynx-smoker  . Hypertension Sister   . Heart attack Maternal Uncle        X2; > 51 yo  . Heart attack Paternal Grandfather        > 55  . Dementia Mother   . Diabetes Neg Hx   . Hyperlipidemia Neg Hx     Social History:  reports that he quit smoking about 33 years ago. His smoking use included cigarettes. he has never used smokeless tobacco. He reports that he drinks alcohol. He reports that he does not use drugs.  ROS: UROLOGY Frequent Urination?: No Hard to postpone urination?: No Burning/pain with urination?: No Get up at night to urinate?: No Leakage of urine?: No Urine stream starts and stops?: Yes Trouble starting stream?: No Do you have to strain to urinate?: No Blood in urine?: Yes Urinary tract infection?: No Sexually transmitted disease?: No Injury to kidneys or bladder?: No Painful intercourse?: No Weak stream?: No Erection problems?: No Penile pain?: No  Gastrointestinal Nausea?: No Vomiting?: No Indigestion/heartburn?: No Diarrhea?: No Constipation?: No  Constitutional Fever: Yes Night sweats?: No Weight loss?: No Fatigue?: No  Skin Skin rash/lesions?: No Itching?: No  Eyes Blurred vision?: No Double vision?: No  Ears/Nose/Throat Sore throat?: No Sinus problems?: No  Hematologic/Lymphatic Swollen glands?: No Easy bruising?: No  Cardiovascular Leg swelling?: No Chest pain?: No  Respiratory Cough?: No Shortness of breath?:  No  Endocrine Excessive thirst?: No  Musculoskeletal Back pain?: No Joint pain?: No  Neurological Headaches?: No Dizziness?: No  Psychologic Depression?: No Anxiety?: No  Physical Exam: BP (!) 155/83   Pulse 91   Ht _0  (1.803 m)   Wt 215 lb (97.5 kg)   BMI 29.99 kg/m   Constitutional:  Alert and oriented, No acute distress. HEENT: [place] AT, moist mucus membranes.  Trachea midline, no masses. Cardiovascular: No clubbing, cyanosis, or edema. CV RRR Respiratory: Normal respiratory effort, no increased work of breathing. GI: Abdomen is soft, nontender, nondistended, no abdominal  masses GU: No CVA tenderness.  Penis circumcised with small amount of blood at the meatus.  No palpable abnormalities of the penile urethra.  Testes descended bilaterally without masses or tenderness. Skin: No rashes, bruises or suspicious lesions. Lymph: No cervical or inguinal adenopathy. Neurologic: Grossly intact, no focal deficits, moving all 4 extremities. Psychiatric: Normal mood and affect.  Laboratory Data: Lab Results  Component Value Date   WBC 5.5 10/21/2017   HGB 17.4 10/21/2017   HCT 50.0 10/21/2017   MCV 90.2 10/21/2017   PLT 158 10/21/2017    Lab Results  Component Value Date   CREATININE 1.14 10/21/2017    Lab Results  Component Value Date   TESTOSTERONE 515.59 09/04/2010    Assessment & Plan:   57 year old male with a bladder foreign body.  I was unable to retrieve with office cystoscopy and he will be scheduled for cystoscopy under anesthesia with foreign body removal.  The procedure was discussed in detail including potential risk of bleeding, infection and ureteral stricture.  Although unlikely if the foreign body is unable to remove endoscopically he could require removal via a cystotomy.  He indicated all questions were answered and desires to proceed.  Rx Ceftin 500 mg twice daily was sent to his pharmacy with since he has been catheterizing with nonmedical  tubing.   Abbie Sons, [place] [address] [place], [place] [address] [phone]

## 2017-12-21 NOTE — Progress Notes (Signed)
12/21/2017 3:17 PM   Nicholas Bonilla 07-Aug-1961 [id number]  Referring provider: Karle Starch, MD [address] [place], [place] [address]  Chief Complaint  Patient presents with  . Other    sexual device in urethra/bladder     HPI: 57 year old male followed for BPH and hypogonadism.  He states 3 days ago he was sexually stimulating himself with a solid plastic catheter and accidentally lost his grip with resultant migration of the catheter which she was unable to be retrieved.  He had no discomfort however yesterday had onset of urinary retention and has been catheterizing himself with a hollow plastic tube.  He is on tamsulosin for BPH.  PMH: Past Medical History:  Diagnosis Date  . ADD (attention deficit disorder with hyperactivity)   . Arthritis   . History of blood clots    Left Leg  . History of kidney stones   . Hypertension   . OCD (obsessive compulsive disorder)   . Ulcerative colitis     Surgical History: Past Surgical History:  Procedure Laterality Date  . COLONOSCOPY      multiple ;Dr Vira Agar  . HERNIA REPAIR    . INGUINAL HERNIA REPAIR  04/10/2010   Bilateral  . LITHOTRIPSY     x2; Dr Darlys Gales, [place]  . SHOULDER ARTHROSCOPY WITH OPEN ROTATOR CUFF REPAIR Right 10/28/2017   Procedure: SHOULDER ARTHROSCOPY WITH OPEN ROTATOR CUFF REPAIR, SUBACROMIANAL DECOMPRESSION AND DISTAL CLAVICLE EXCISION;  Surgeon: Thornton Park, MD;  Location: [facility] ORS;  Service: Orthopedics;  Laterality: Right;  . TONSILLECTOMY      Home Medications:  Allergies as of 12/21/2017      Reactions   Codeine Rash, Other (See Comments)   Only in high doses   Naproxen Sodium Other (See Comments)   REACTION: colitis flare   Promethazine Hcl Other (See Comments)   REACTION: mental status changes   Latex Rash      Medication List        Accurate as of 12/21/17  3:17 PM. Always use your most recent med list.          acetaminophen 500 MG tablet Commonly  known as:  TYLENOL Take 500-1,000 mg by mouth every 6 (six) hours as needed for moderate pain or headache.   amLODipine 5 MG tablet Commonly known as:  NORVASC Take 1 tablet (5 mg total) by mouth daily.   Amphetamine ER 18.8 MG Tbed Take 18.8 mg by mouth daily.   amphetamine-dextroamphetamine 30 MG tablet Commonly known as:  ADDERALL Take 15 mg by mouth daily.   anastrozole 1 MG tablet Commonly known as:  ARIMIDEX Take 1 mg by mouth every Wednesday.   apixaban 5 MG Tabs tablet Commonly known as:  ELIQUIS Take 1 tablet (5 mg total) by mouth 2 (two) times daily.   balsalazide 750 MG capsule Commonly known as:  COLAZAL Take 2,250 mg by mouth 3 (three) times daily.   Co Q-10 100 MG Caps Take 100 mg by mouth daily.   DHEA 25 MG tablet Take 25 mg by mouth daily.   hydrocortisone 10 MG tablet Commonly known as:  CORTEF Take 7.5 mg by mouth daily.   metoprolol tartrate 50 MG tablet Commonly known as:  LOPRESSOR Take 1 tablet (50 mg total) by mouth 2 (two) times daily.   MULTIVITAMIN PO Take 1 tablet by mouth 2 (two) times daily.   ondansetron 4 MG tablet Commonly known as:  ZOFRAN Take 1 tablet (4 mg total)  every 8 (eight) hours as needed by mouth for nausea or vomiting.   OVER THE COUNTER MEDICATION Take 1 tablet by mouth 2 (two) times daily. Ultra Hepa Trope Supplement   OVER THE COUNTER MEDICATION Take 1 tablet by mouth 2 (two) times daily. Inflamed Supplement   oxyCODONE 5 MG immediate release tablet Commonly known as:  Oxy IR/ROXICODONE Take 1 tablet (5 mg total) every 4 (four) hours as needed by mouth for severe pain.   tamsulosin 0.4 MG Caps capsule Commonly known as:  FLOMAX Take 0.4 mg by mouth daily.   Testosterone Cypionate 200 MG/ML Kit Inject 140 mg into the muscle See admin instructions. Inject 140 mg IM every 11 days   venlafaxine XR 150 MG 24 hr capsule Commonly known as:  EFFEXOR-XR Take 225 mg by mouth daily.   Vitamin D3 5000 units  Caps Take 5,000 Units by mouth daily.       Allergies:  Allergies  Allergen Reactions  . Codeine Rash and Other (See Comments)    Only in high doses  . Naproxen Sodium Other (See Comments)    REACTION: colitis flare  . Promethazine Hcl Other (See Comments)    REACTION: mental status changes  . Latex Rash    Family History: Family History  Problem Relation Age of Onset  . Heart attack Father 43  . Hypertension Father   . Rheumatic fever Father   . Sudden death Father   . Cancer Sister        Larynx-smoker  . Hypertension Sister   . Heart attack Maternal Uncle        X2; > 51 yo  . Heart attack Paternal Grandfather        > 55  . Dementia Mother   . Diabetes Neg Hx   . Hyperlipidemia Neg Hx     Social History:  reports that he quit smoking about 33 years ago. His smoking use included cigarettes. he has never used smokeless tobacco. He reports that he drinks alcohol. He reports that he does not use drugs.  ROS: UROLOGY Frequent Urination?: No Hard to postpone urination?: No Burning/pain with urination?: No Get up at night to urinate?: No Leakage of urine?: No Urine stream starts and stops?: Yes Trouble starting stream?: No Do you have to strain to urinate?: No Blood in urine?: Yes Urinary tract infection?: No Sexually transmitted disease?: No Injury to kidneys or bladder?: No Painful intercourse?: No Weak stream?: No Erection problems?: No Penile pain?: No  Gastrointestinal Nausea?: No Vomiting?: No Indigestion/heartburn?: No Diarrhea?: No Constipation?: No  Constitutional Fever: Yes Night sweats?: No Weight loss?: No Fatigue?: No  Skin Skin rash/lesions?: No Itching?: No  Eyes Blurred vision?: No Double vision?: No  Ears/Nose/Throat Sore throat?: No Sinus problems?: No  Hematologic/Lymphatic Swollen glands?: No Easy bruising?: No  Cardiovascular Leg swelling?: No Chest pain?: No  Respiratory Cough?: No Shortness of breath?:  No  Endocrine Excessive thirst?: No  Musculoskeletal Back pain?: No Joint pain?: No  Neurological Headaches?: No Dizziness?: No  Psychologic Depression?: No Anxiety?: No  Physical Exam: BP (!) 155/83   Pulse 91   Ht _0  (1.803 m)   Wt 215 lb (97.5 kg)   BMI 29.99 kg/m   Constitutional:  Alert and oriented, No acute distress. HEENT: [place] AT, moist mucus membranes.  Trachea midline, no masses. Cardiovascular: No clubbing, cyanosis, or edema. CV RRR Respiratory: Normal respiratory effort, no increased work of breathing. GI: Abdomen is soft, nontender, nondistended, no abdominal  masses GU: No CVA tenderness.  Penis circumcised with small amount of blood at the meatus.  No palpable abnormalities of the penile urethra.  Testes descended bilaterally without masses or tenderness. Skin: No rashes, bruises or suspicious lesions. Lymph: No cervical or inguinal adenopathy. Neurologic: Grossly intact, no focal deficits, moving all 4 extremities. Psychiatric: Normal mood and affect.  Laboratory Data: Lab Results  Component Value Date   WBC 5.5 10/21/2017   HGB 17.4 10/21/2017   HCT 50.0 10/21/2017   MCV 90.2 10/21/2017   PLT 158 10/21/2017    Lab Results  Component Value Date   CREATININE 1.14 10/21/2017    Lab Results  Component Value Date   TESTOSTERONE 515.59 09/04/2010    Assessment & Plan:   57 year old male with a bladder foreign body.  I was unable to retrieve with office cystoscopy and he will be scheduled for cystoscopy under anesthesia with foreign body removal.  The procedure was discussed in detail including potential risk of bleeding, infection and ureteral stricture.  Although unlikely if the foreign body is unable to remove endoscopically he could require removal via a cystotomy.  He indicated all questions were answered and desires to proceed.  Rx Ceftin 500 mg twice daily was sent to his pharmacy with since he has been catheterizing with nonmedical  tubing.   Abbie Sons, [place] [address] [place], [place] [address] [phone]

## 2017-12-21 NOTE — Progress Notes (Signed)
   12/21/17  CC:  Chief Complaint  Patient presents with  . Other    sexual device in urethra/bladder     HPI: 57 year old male with urethral foreign body.  Blood pressure (!) 155/83, pulse 91, height 5\' 11"  (1.803 m), weight 215 lb (97.5 kg). NED. A&Ox3.   No respiratory distress   Abd soft, NT, ND Normal phallus with bilateral descended testicles  Cystoscopy Procedure Note  Patient identification was confirmed, informed consent was obtained, and patient was prepped using Betadine solution.  Lidocaine jelly was administered per urethral meatus.    Preoperative abx where received prior to procedure.     Pre-Procedure: - Inspection reveals a normal caliber ureteral meatus.  Procedure: The flexible cystoscope was introduced without difficulty - No urethral strictures/lesions are present. - Enlarged prostate with lateral lobe enlargement - Elevated bladder neck - Bilateral ureteral orifices identified - Bladder mucosa  reveals no ulcers, tumors, or lesions.  Foreign body noted in bladder.  Due to the diameter grasping forceps were unable to be secured around the device for removal. - No bladder stones - No trabeculation  Retroflexion shows no significant abnormalities   Post-Procedure: - Patient tolerated the procedure well  Assessment/ Plan: Foreign body bladder.  He was informed that should not be causing urinary retention.  He will be scheduled in the OR for cystoscopy with foreign body removal.  He was instructed in intermittent catheterization and given a limited catheter supply.

## 2017-12-22 ENCOUNTER — Ambulatory Visit: Payer: BLUE CROSS/BLUE SHIELD | Admitting: Registered Nurse

## 2017-12-22 ENCOUNTER — Encounter
Admission: RE | Disposition: A | Discharge status: Home / Self Care | Payer: Self-pay | Source: Ambulatory Visit | Attending: Urology

## 2017-12-22 ENCOUNTER — Ambulatory Visit
Admission: RE | Admit: 2017-12-22 | Discharge: 2017-12-22 | Disposition: A | Discharge status: Home / Self Care | Payer: BLUE CROSS/BLUE SHIELD | Source: Ambulatory Visit | Attending: Urology | Admitting: Urology

## 2017-12-22 ENCOUNTER — Encounter: Payer: Self-pay | Admitting: *Deleted

## 2017-12-22 ENCOUNTER — Other Ambulatory Visit: Payer: Self-pay

## 2017-12-22 ENCOUNTER — Encounter: Payer: Self-pay | Admitting: Urology

## 2017-12-22 DIAGNOSIS — R338 Other retention of urine: Secondary | ICD-10-CM | POA: Diagnosis not present

## 2017-12-22 DIAGNOSIS — E291 Testicular hypofunction: Secondary | ICD-10-CM | POA: Insufficient documentation

## 2017-12-22 DIAGNOSIS — M1990 Unspecified osteoarthritis, unspecified site: Secondary | ICD-10-CM | POA: Diagnosis not present

## 2017-12-22 DIAGNOSIS — N401 Enlarged prostate with lower urinary tract symptoms: Secondary | ICD-10-CM | POA: Insufficient documentation

## 2017-12-22 DIAGNOSIS — Z87891 Personal history of nicotine dependence: Secondary | ICD-10-CM | POA: Diagnosis not present

## 2017-12-22 DIAGNOSIS — F419 Anxiety disorder, unspecified: Secondary | ICD-10-CM | POA: Insufficient documentation

## 2017-12-22 DIAGNOSIS — Z91040 Latex allergy status: Secondary | ICD-10-CM | POA: Diagnosis not present

## 2017-12-22 DIAGNOSIS — I10 Essential (primary) hypertension: Secondary | ICD-10-CM | POA: Insufficient documentation

## 2017-12-22 DIAGNOSIS — Z86718 Personal history of other venous thrombosis and embolism: Secondary | ICD-10-CM | POA: Diagnosis not present

## 2017-12-22 DIAGNOSIS — Z886 Allergy status to analgesic agent status: Secondary | ICD-10-CM | POA: Diagnosis not present

## 2017-12-22 DIAGNOSIS — X58XXXA Exposure to other specified factors, initial encounter: Secondary | ICD-10-CM | POA: Diagnosis not present

## 2017-12-22 DIAGNOSIS — T191XXD Foreign body in bladder, subsequent encounter: Secondary | ICD-10-CM | POA: Diagnosis not present

## 2017-12-22 DIAGNOSIS — Z8249 Family history of ischemic heart disease and other diseases of the circulatory system: Secondary | ICD-10-CM | POA: Diagnosis not present

## 2017-12-22 DIAGNOSIS — F909 Attention-deficit hyperactivity disorder, unspecified type: Secondary | ICD-10-CM | POA: Insufficient documentation

## 2017-12-22 DIAGNOSIS — Z87442 Personal history of urinary calculi: Secondary | ICD-10-CM | POA: Insufficient documentation

## 2017-12-22 DIAGNOSIS — Z7901 Long term (current) use of anticoagulants: Secondary | ICD-10-CM | POA: Insufficient documentation

## 2017-12-22 DIAGNOSIS — T191XXA Foreign body in bladder, initial encounter: Secondary | ICD-10-CM | POA: Insufficient documentation

## 2017-12-22 DIAGNOSIS — Z8711 Personal history of peptic ulcer disease: Secondary | ICD-10-CM | POA: Insufficient documentation

## 2017-12-22 DIAGNOSIS — Z79899 Other long term (current) drug therapy: Secondary | ICD-10-CM | POA: Insufficient documentation

## 2017-12-22 DIAGNOSIS — Z888 Allergy status to other drugs, medicaments and biological substances status: Secondary | ICD-10-CM | POA: Diagnosis not present

## 2017-12-22 DIAGNOSIS — I739 Peripheral vascular disease, unspecified: Secondary | ICD-10-CM | POA: Diagnosis not present

## 2017-12-22 DIAGNOSIS — T190XXA Foreign body in urethra, initial encounter: Secondary | ICD-10-CM

## 2017-12-22 DIAGNOSIS — F429 Obsessive-compulsive disorder, unspecified: Secondary | ICD-10-CM | POA: Insufficient documentation

## 2017-12-22 DIAGNOSIS — Z885 Allergy status to narcotic agent status: Secondary | ICD-10-CM | POA: Diagnosis not present

## 2017-12-22 HISTORY — PX: FOREIGN BODY REMOVAL: SHX962

## 2017-12-22 HISTORY — PX: CYSTOSCOPY: SHX5120

## 2017-12-22 LAB — SURGICAL PATHOLOGY

## 2017-12-22 SURGERY — CYSTOSCOPY
Anesthesia: General | Wound class: Clean Contaminated

## 2017-12-22 MED ORDER — PROPOFOL 10 MG/ML IV BOLUS
INTRAVENOUS | Status: DC | PRN
  Administered 2017-12-22: 200 mg via INTRAVENOUS

## 2017-12-22 MED ORDER — PHENYLEPHRINE HCL 10 MG/ML IJ SOLN
INTRAMUSCULAR | Status: DC | PRN
  Administered 2017-12-22: 200 ug via INTRAVENOUS
  Administered 2017-12-22: 100 ug via INTRAVENOUS

## 2017-12-22 MED ORDER — FENTANYL CITRATE (PF) 250 MCG/5ML IJ SOLN
INTRAMUSCULAR | Status: AC
  Filled 2017-12-22: qty 5

## 2017-12-22 MED ORDER — EPHEDRINE SULFATE 50 MG/ML IJ SOLN
INTRAMUSCULAR | Status: DC | PRN
  Administered 2017-12-22 (×2): 10 mg via INTRAVENOUS

## 2017-12-22 MED ORDER — MIDAZOLAM HCL 5 MG/5ML IJ SOLN
INTRAMUSCULAR | Status: DC | PRN
  Administered 2017-12-22: 2 mg via INTRAVENOUS

## 2017-12-22 MED ORDER — ONDANSETRON HCL 4 MG/2ML IJ SOLN
INTRAMUSCULAR | Status: DC | PRN
  Administered 2017-12-22: 4 mg via INTRAVENOUS

## 2017-12-22 MED ORDER — LIDOCAINE HCL (PF) 2 % IJ SOLN
INTRAMUSCULAR | Status: DC | PRN
  Administered 2017-12-22: 50 mg

## 2017-12-22 MED ORDER — ONDANSETRON HCL 4 MG/2ML IJ SOLN: 4.0000 mg | Freq: Once | INTRAMUSCULAR | Status: DC | PRN

## 2017-12-22 MED ORDER — GLYCOPYRROLATE 0.2 MG/ML IJ SOLN
INTRAMUSCULAR | Status: AC
  Filled 2017-12-22: qty 1

## 2017-12-22 MED ORDER — GLYCOPYRROLATE 0.2 MG/ML IJ SOLN
INTRAMUSCULAR | Status: DC | PRN
  Administered 2017-12-22: 0.2 mg via INTRAVENOUS

## 2017-12-22 MED ORDER — FENTANYL CITRATE (PF) 100 MCG/2ML IJ SOLN: 25.0000 ug | INTRAMUSCULAR | Status: DC | PRN

## 2017-12-22 MED ORDER — EPHEDRINE SULFATE 50 MG/ML IJ SOLN
INTRAMUSCULAR | Status: AC
  Filled 2017-12-22: qty 1

## 2017-12-22 MED ORDER — DEXAMETHASONE SODIUM PHOSPHATE 10 MG/ML IJ SOLN
INTRAMUSCULAR | Status: DC | PRN
  Administered 2017-12-22: 5 mg via INTRAVENOUS

## 2017-12-22 MED ORDER — LACTATED RINGERS IV SOLN
INTRAVENOUS | Status: DC
  Administered 2017-12-22: 11:00:00 via INTRAVENOUS

## 2017-12-22 MED ORDER — TRAMADOL HCL 50 MG PO TABS: 50.0000 mg | ORAL_TABLET | Freq: Four times a day (QID) | ORAL | 0 refills | Status: DC | PRN

## 2017-12-22 MED ORDER — FENTANYL CITRATE (PF) 100 MCG/2ML IJ SOLN
INTRAMUSCULAR | Status: DC | PRN
  Administered 2017-12-22 (×3): 50 ug via INTRAVENOUS

## 2017-12-22 MED ORDER — ONDANSETRON HCL 4 MG/2ML IJ SOLN
INTRAMUSCULAR | Status: AC
  Filled 2017-12-22: qty 2

## 2017-12-22 MED ORDER — MIDAZOLAM HCL 2 MG/2ML IJ SOLN
INTRAMUSCULAR | Status: AC
  Filled 2017-12-22: qty 2

## 2017-12-22 MED ORDER — DEXAMETHASONE SODIUM PHOSPHATE 10 MG/ML IJ SOLN
INTRAMUSCULAR | Status: AC
  Filled 2017-12-22: qty 1

## 2017-12-22 MED ORDER — LIDOCAINE HCL (PF) 2 % IJ SOLN
INTRAMUSCULAR | Status: AC
  Filled 2017-12-22: qty 10

## 2017-12-22 SURGICAL SUPPLY — 11 items
BAG DRAIN CYSTO-URO LG1000N (MISCELLANEOUS) IMPLANT
GLOVE BIOGEL M 8.0 STRL (GLOVE) ×3 IMPLANT
GOWN STANDARD XL  REUSABL (MISCELLANEOUS) ×3 IMPLANT
KIT RM TURNOVER CYSTO AR (KITS) ×3 IMPLANT
PACK CYSTO AR (MISCELLANEOUS) ×3 IMPLANT
SET CYSTO W/LG BORE CLAMP LF (SET/KITS/TRAYS/PACK) ×3 IMPLANT
SURGILUBE 2OZ TUBE FLIPTOP (MISCELLANEOUS) ×3 IMPLANT
SYR 10ML LL (SYRINGE) ×3 IMPLANT
SYRINGE IRR TOOMEY STRL 70CC (SYRINGE) IMPLANT
WATER STERILE IRR 1000ML POUR (IV SOLUTION) ×3 IMPLANT
WATER STERILE IRR 3000ML UROMA (IV SOLUTION) ×3 IMPLANT

## 2017-12-22 NOTE — Op Note (Signed)
Date of procedure: 12/22/17  Preoperative diagnosis:  1. Foreign body bladder  Postoperative diagnosis:  1. Foreign body bladder  Procedure: 1. Cystoscopy with removal of foreign body  Surgeon: Irineo AxonScott Stoioff, MD  Anesthesia: General  Complications: None  Intraoperative findings: Approximately 16 cm solid silicon tapered tube in bladder  EBL: None  Specimens: Foreign body  Drains: None  Indication: Nicholas Bonilla is a 57 y.o. male urethral sounding for sexual stimulation with proximal migration of the silicone sound.  He developed urinary retention and catheterized himself with tubing apparently pushing the sound into the bladder.  Attempts at removal with flexible office cystoscopy were unsuccessful.  After reviewing the management options for treatment, he elected to proceed with the above surgical procedure(s). We have discussed the potential benefits and risks of the procedure, side effects of the proposed treatment, the likelihood of the patient achieving the goals of the procedure, and any potential problems that might occur during the procedure or recuperation. Informed consent has been obtained.  Description of procedure:  The patient was taken to the operating room and general anesthesia was induced.  The patient was placed in the dorsal lithotomy position, prepped and draped in the usual sterile fashion, and preoperative antibiotics were administered. A preoperative time-out was performed.   A 22 French cystoscope was lubricated and passed under direct vision.  The urethra was normal and caliber.  There was moderate lateral lobe enlargement with a prominent median lobe.  Upon introduction of the cystoscope in the bladder the tubing was easily identified.  No bladder mucosal lesions were noted.  The end of the tubing was located and grasped with endoscopic forceps and removed without difficulty.    After anesthetic reversal he was transported to the PACU in stable  condition.    Irineo AxonScott Stoioff, M.D.

## 2017-12-22 NOTE — Transfer of Care (Signed)
Immediate Anesthesia Transfer of Care Note  Patient: Nicholas Bonilla  Procedure(s) Performed: CYSTOSCOPY (N/A ) Bladder & urethral FOREIGN BODY REMOVAL ADULT (N/A )  Patient Location: PACU  Anesthesia Type:General  Level of Consciousness: sedated  Airway & Oxygen Therapy: Patient Spontanous Breathing and Patient connected to face mask oxygen  Post-op Assessment: Report given to RN and Post -op Vital signs reviewed and stable  Post vital signs: Reviewed  Last Vitals:  Vitals:   12/22/17 1258 12/22/17 1259  BP:  90/62  Pulse:  66  Resp:  12  Temp:    SpO2: 93% 94%    Last Pain:  Vitals:   12/22/17 1258  TempSrc:   PainSc: Asleep         Complications: No apparent anesthesia complications

## 2017-12-22 NOTE — Anesthesia Post-op Follow-up Note (Signed)
Anesthesia QCDR form completed.        

## 2017-12-22 NOTE — Anesthesia Postprocedure Evaluation (Signed)
Anesthesia Post Note  Patient: [name]  Procedure(s) Performed: CYSTOSCOPY (N/A ) Bladder & urethral FOREIGN BODY REMOVAL ADULT (N/A )  Patient location during evaluation: PACU Anesthesia Type: General Level of consciousness: awake and alert Pain management: pain level controlled Vital Signs Assessment: post-procedure vital signs reviewed and stable Respiratory status: spontaneous breathing and respiratory function stable Cardiovascular status: stable Anesthetic complications: no     Last Vitals:  Vitals:   12/22/17 1313 12/22/17 1328  BP: (!) 94/58 103/72  Pulse: 73 75  Resp: 14 17  Temp:    SpO2: 98% 93%    Last Pain:  Vitals:   12/22/17 1258  TempSrc:   PainSc: Asleep                 [name]

## 2017-12-22 NOTE — Anesthesia Preprocedure Evaluation (Signed)
Anesthesia Evaluation  Patient identified by MRN, date of birth, ID band Patient awake    Reviewed: Allergy & Precautions, NPO status , Patient's Chart, lab work & pertinent test results  History of Anesthesia Complications Negative for: history of anesthetic complications  Airway Mallampati: II       Dental   Pulmonary neg sleep apnea, neg COPD, former smoker,           Cardiovascular hypertension, Pt. on medications and Pt. on home beta blockers + Peripheral Vascular Disease (DVTs)  (-) CAD and (-) Past MI (-) dysrhythmias (-) Valvular Problems/Murmurs     Neuro/Psych neg Seizures Anxiety    GI/Hepatic Neg liver ROS, PUD, neg GERD  ,  Endo/Other  negative endocrine ROSneg diabetes  Renal/GU negative Renal ROS     Musculoskeletal   Abdominal   Peds  Hematology   Anesthesia Other Findings   Reproductive/Obstetrics                             Anesthesia Physical Anesthesia Plan  ASA: II  Anesthesia Plan: General   Post-op Pain Management:    Induction: Intravenous  PONV Risk Score and Plan: 2 and Dexamethasone and Ondansetron  Airway Management Planned: LMA and Oral ETT  Additional Equipment:   Intra-op Plan:   Post-operative Plan:   Informed Consent: I have reviewed the patients History and Physical, chart, labs and discussed the procedure including the risks, benefits and alternatives for the proposed anesthesia with the patient or authorized representative who has indicated his/her understanding and acceptance.     Plan Discussed with:   Anesthesia Plan Comments:         Anesthesia Quick Evaluation

## 2017-12-22 NOTE — Interval H&P Note (Signed)
History and Physical Interval Note:  12/22/2017 12:03 PM  Nicholas Bonilla  has presented today for surgery, with the diagnosis of Bladder foreign body  The various methods of treatment have been discussed with the patient and family. After consideration of risks, benefits and other options for treatment, the patient has consented to  Procedure(s): CYSTOSCOPY (N/A) Bladder & urethral FOREIGN BODY REMOVAL ADULT (N/A) as a surgical intervention .  The patient's history has been reviewed, patient examined, no change in status, stable for surgery.  I have reviewed the patient's chart and labs.  Questions were answered to the patient's satisfaction.     Scott C Stoioff

## 2017-12-22 NOTE — Anesthesia Procedure Notes (Signed)
Procedure Name: LMA Insertion Performed by: Aviraj Kentner, CRNA Pre-anesthesia Checklist: Patient identified, Patient being monitored, Timeout performed, Emergency Drugs available and Suction available Patient Re-evaluated:Patient Re-evaluated prior to induction Oxygen Delivery Method: Circle system utilized Preoxygenation: Pre-oxygenation with 100% oxygen Induction Type: IV induction Ventilation: Mask ventilation without difficulty LMA: LMA inserted LMA Size: 4.5 Tube type: Oral Number of attempts: 1 Placement Confirmation: positive ETCO2 and breath sounds checked- equal and bilateral Tube secured with: Tape Dental Injury: Teeth and Oropharynx as per pre-operative assessment        

## 2017-12-22 NOTE — Discharge Instructions (Signed)

## 2017-12-23 ENCOUNTER — Encounter: Payer: Self-pay | Admitting: Urology

## 2017-12-23 ENCOUNTER — Ambulatory Visit: Payer: Self-pay | Admitting: Urology

## 2018-01-24 ENCOUNTER — Encounter: Payer: Self-pay | Admitting: Urology

## 2018-01-24 ENCOUNTER — Ambulatory Visit (INDEPENDENT_AMBULATORY_CARE_PROVIDER_SITE_OTHER): Payer: BLUE CROSS/BLUE SHIELD | Admitting: Urology

## 2018-01-24 VITALS — BP 133/80 | HR 64 | Ht 71.0 in | Wt 221.0 lb

## 2018-01-24 DIAGNOSIS — R319 Hematuria, unspecified: Secondary | ICD-10-CM

## 2018-01-24 DIAGNOSIS — N390 Urinary tract infection, site not specified: Secondary | ICD-10-CM | POA: Diagnosis not present

## 2018-01-24 DIAGNOSIS — R300 Dysuria: Secondary | ICD-10-CM

## 2018-01-24 LAB — URINALYSIS, COMPLETE
Bilirubin, UA: NEGATIVE
GLUCOSE, UA: NEGATIVE
Ketones, UA: NEGATIVE
NITRITE UA: POSITIVE — AB
PH UA: 5.5 (ref 5.0–7.5)
Specific Gravity, UA: 1.025 (ref 1.005–1.030)
UUROB: 0.2 mg/dL (ref 0.2–1.0)

## 2018-01-24 LAB — MICROSCOPIC EXAMINATION: Epithelial Cells (non renal): NONE SEEN /hpf (ref 0–10)

## 2018-01-24 MED ORDER — SULFAMETHOXAZOLE-TRIMETHOPRIM 800-160 MG PO TABS: 1.0000 | ORAL_TABLET | Freq: Every day | ORAL | 0 refills | Status: DC

## 2018-01-24 NOTE — Progress Notes (Signed)
01/24/2018 10:39 AM   Nicholas Bonilla 05-Sep-1961 [id number]  Referring provider: Karle Starch, MD [address] [place], [place] [address]  Chief Complaint  Patient presents with  . Follow-up    HPI: 57 year old male presents for postop follow-up.  He was stimulating his urethra for sexual satisfaction and lost it in the urethra.  He subsequently pushed the device back into his bladder with a catheter.  He underwent cystoscopic removal of the foreign body in early January 2019.  He was discharged on antibiotics however had persistent symptoms after complete his course and states he was seen at a walk-in clinic and was treated with a 14-day course of Cipro.  He finished this antibiotic last week and states 3 days ago noted cloudy urine and mild dysuria.  He denies fever or chills.   PMH: Past Medical History:  Diagnosis Date  . ADD (attention deficit disorder with hyperactivity)   . Arthritis   . History of blood clots    Left Leg  . History of kidney stones   . Hypertension   . OCD (obsessive compulsive disorder)   . Ulcerative colitis     Surgical History: Past Surgical History:  Procedure Laterality Date  . COLONOSCOPY      multiple ;Dr Vira Agar  . CYSTOSCOPY N/A 12/22/2017   Procedure: CYSTOSCOPY;  Surgeon: Abbie Sons, MD;  Location: [facility] OR[facility];  Service: Urology;  Laterality: N/A;  . FOREIGN BODY REMOVAL N/A 12/22/2017   Procedure: Bladder & urethral FOREIGN BODY REMOVAL ADULT;  Surgeon: Abbie Sons, MD;  Location: [facility] OR[facility];  Service: Urology;  Laterality: N/A;  . HERNIA REPAIR    . INGUINAL HERNIA REPAIR  04/10/2010   Bilateral  . LITHOTRIPSY     x2; Dr Darlys Gales, [place]  . SHOULDER ARTHROSCOPY WITH OPEN ROTATOR CUFF REPAIR Right 10/28/2017   Procedure: SHOULDER ARTHROSCOPY WITH OPEN ROTATOR CUFF REPAIR, SUBACROMIANAL DECOMPRESSION AND DISTAL CLAVICLE EXCISION;  Surgeon: Thornton Park, MD;  Location: [facility];  Service: Orthopedics;   Laterality: Right;  . TONSILLECTOMY      Home Medications:  Allergies as of 01/24/2018      Reactions   Codeine Rash, Other (See Comments)   Only in high doses   Naproxen Sodium Other (See Comments)   REACTION: colitis flare   Promethazine Hcl Other (See Comments)   REACTION: mental status changes   Latex Rash      Medication List        Accurate as of 01/24/18 10:39 AM. Always use your most recent med list.          acetaminophen 500 MG tablet Commonly known as:  TYLENOL Take 500-1,000 mg by mouth every 6 (six) hours as needed for moderate pain or headache.   amLODipine 5 MG tablet Commonly known as:  NORVASC Take 1 tablet (5 mg total) by mouth daily.   Amphetamine ER 18.8 MG Tbed Take 18.8 mg by mouth daily.   amphetamine-dextroamphetamine 30 MG tablet Commonly known as:  ADDERALL Take 15 mg by mouth daily.   anastrozole 1 MG tablet Commonly known as:  ARIMIDEX Take 1 mg by mouth every Wednesday.   apixaban 5 MG Tabs tablet Commonly known as:  ELIQUIS Take 1 tablet (5 mg total) by mouth 2 (two) times daily.   balsalazide 750 MG capsule Commonly known as:  COLAZAL Take 2,250 mg by mouth 3 (three) times daily.   Co Q-10 100 MG Caps Take 100 mg by mouth daily.  DHEA 25 MG tablet Take 25 mg by mouth daily.   hydrocortisone 5 MG tablet Commonly known as:  CORTEF Take 7.5 mg by mouth daily.   metoprolol tartrate 50 MG tablet Commonly known as:  LOPRESSOR Take 1 tablet (50 mg total) by mouth 2 (two) times daily.   MULTIVITAMIN PO Take 1 tablet by mouth 2 (two) times daily.   OVER THE COUNTER MEDICATION Take 1 tablet by mouth 2 (two) times daily. Ultra Hepa Trope Supplement   OVER THE COUNTER MEDICATION Take 1 tablet by mouth 2 (two) times daily. Inflamed Supplement   tamsulosin 0.4 MG Caps capsule Commonly known as:  FLOMAX Take 0.4 mg by mouth daily.   Testosterone Cypionate 200 MG/ML Kit Inject 140 mg into the muscle See admin instructions.  Inject 140 mg IM every 11 days   traMADol 50 MG tablet Commonly known as:  ULTRAM Take 1 tablet (50 mg total) by mouth every 6 (six) hours as needed for moderate pain.   venlafaxine XR 150 MG 24 hr capsule Commonly known as:  EFFEXOR-XR Take 225 mg by mouth daily. Pt has 150 mg and 75 mg = 225 mg   Vitamin D3 5000 units Caps Take 5,000 Units by mouth daily.       Allergies:  Allergies  Allergen Reactions  . Codeine Rash and Other (See Comments)    Only in high doses  . Naproxen Sodium Other (See Comments)    REACTION: colitis flare  . Promethazine Hcl Other (See Comments)    REACTION: mental status changes  . Latex Rash    Family History: Family History  Problem Relation Age of Onset  . Heart attack Father 87  . Hypertension Father   . Rheumatic fever Father   . Sudden death Father   . Cancer Sister        Larynx-smoker  . Hypertension Sister   . Heart attack Maternal Uncle        X2; > 53 yo  . Heart attack Paternal Grandfather        > 55  . Dementia Mother   . Diabetes Neg Hx   . Hyperlipidemia Neg Hx     Social History:  reports that he quit smoking about 33 years ago. His smoking use included cigarettes. he has never used smokeless tobacco. He reports that he drinks alcohol. He reports that he does not use drugs.  ROS: UROLOGY Frequent Urination?: Yes Hard to postpone urination?: Yes Burning/pain with urination?: No Get up at night to urinate?: Yes Leakage of urine?: No Urine stream starts and stops?: Yes Trouble starting stream?: Yes Do you have to strain to urinate?: Yes Blood in urine?: Yes Urinary tract infection?: Yes Sexually transmitted disease?: No Injury to kidneys or bladder?: No Painful intercourse?: No Weak stream?: No Erection problems?: No Penile pain?: No  Gastrointestinal Nausea?: No Vomiting?: No Indigestion/heartburn?: No Diarrhea?: No Constipation?: No  Constitutional Fever: No Night sweats?: No Weight loss?:  No Fatigue?: No  Skin Skin rash/lesions?: No Itching?: No  Eyes Blurred vision?: No Double vision?: No  Ears/Nose/Throat Sore throat?: No Sinus problems?: No  Hematologic/Lymphatic Swollen glands?: No Easy bruising?: No  Cardiovascular Leg swelling?: No Chest pain?: No  Respiratory Cough?: No Shortness of breath?: No  Endocrine Excessive thirst?: No  Musculoskeletal Back pain?: No Joint pain?: No  Neurological Headaches?: No Dizziness?: No  Psychologic Depression?: Yes Anxiety?: No  Physical Exam: BP 133/80 (BP Location: Left Arm, Patient Position: Sitting, Cuff Size: Normal)  Pulse 64   Ht 5' 11"  (1.803 m)   Wt 221 lb (100.2 kg)   BMI 30.82 kg/m   Constitutional:  Alert and oriented, No acute distress. HEENT: [place] AT, moist mucus membranes.  Trachea midline, no masses. Cardiovascular: No clubbing, cyanosis, or edema. Respiratory: Normal respiratory effort, no increased work of breathing. GI: Abdomen is soft, nontender, nondistended, no abdominal masses GU: No CVA tenderness. Skin: No rashes, bruises or suspicious lesions. Lymph: No cervical or inguinal adenopathy. Neurologic: Grossly intact, no focal deficits, moving all 4 extremities. Psychiatric: Normal mood and affect.  Laboratory Data: Lab Results  Component Value Date   WBC 5.5 10/21/2017   HGB 17.4 10/21/2017   HCT 50.0 10/21/2017   MCV 90.2 10/21/2017   PLT 158 10/21/2017    Lab Results  Component Value Date   CREATININE 1.14 10/21/2017    Lab Results  Component Value Date   TESTOSTERONE 515.59 09/04/2010     Urinalysis Microscopy shows 11-30 WBCs    Assessment & Plan:    1. Urinary tract infection with hematuria, site unspecified Urine was sent for culture.  Will start antibiotics pending the culture result.  Rx was sent to his pharmacy.  Will treat for 28 days to cover prostate.  - CULTURE, URINE COMPREHENSIVE - Urinalysis, Complete   Return in about 4 months  (around 05/24/2018) for Recheck.  Abbie Sons, [place] [address] [place], [place] [address] [phone]

## 2018-01-25 ENCOUNTER — Encounter: Payer: Self-pay | Admitting: Urology

## 2018-01-28 ENCOUNTER — Telehealth: Payer: Self-pay

## 2018-01-28 LAB — CULTURE, URINE COMPREHENSIVE

## 2018-01-28 NOTE — Telephone Encounter (Signed)
-----   Message from Riki AltesScott C Stoioff, MD sent at 01/28/2018 12:52 PM EST ----- Urine culture was positive and sensitive to prescribed antibiotic

## 2018-01-28 NOTE — Telephone Encounter (Signed)
LMOM on correct abx.  

## 2018-03-03 ENCOUNTER — Encounter (INDEPENDENT_AMBULATORY_CARE_PROVIDER_SITE_OTHER): Payer: Self-pay | Admitting: Vascular Surgery

## 2018-03-03 ENCOUNTER — Ambulatory Visit (INDEPENDENT_AMBULATORY_CARE_PROVIDER_SITE_OTHER): Payer: BLUE CROSS/BLUE SHIELD

## 2018-03-03 ENCOUNTER — Ambulatory Visit (INDEPENDENT_AMBULATORY_CARE_PROVIDER_SITE_OTHER): Payer: BLUE CROSS/BLUE SHIELD | Admitting: Vascular Surgery

## 2018-03-03 VITALS — BP 136/87 | HR 82 | Resp 15 | Ht 71.0 in | Wt 220.0 lb

## 2018-03-03 DIAGNOSIS — I82412 Acute embolism and thrombosis of left femoral vein: Secondary | ICD-10-CM | POA: Diagnosis not present

## 2018-03-03 DIAGNOSIS — I10 Essential (primary) hypertension: Secondary | ICD-10-CM | POA: Diagnosis not present

## 2018-03-03 DIAGNOSIS — E785 Hyperlipidemia, unspecified: Secondary | ICD-10-CM | POA: Diagnosis not present

## 2018-03-06 ENCOUNTER — Encounter (INDEPENDENT_AMBULATORY_CARE_PROVIDER_SITE_OTHER): Payer: Self-pay | Admitting: Vascular Surgery

## 2018-03-06 NOTE — Progress Notes (Signed)
MRN : [id number]  Nicholas Bonilla is a 57 y.o. (1961/07/30) male who presents with chief complaint of  Chief Complaint  Patient presents with  . Follow-up    3 month LLE DVT  .  History of Present Illness:  The patient presents to the office for evaluation of DVT.  DVT was identified at [facility] by Duplex ultrasound.  The initial symptoms were pain and swelling in the lower extremity.  The patient notes the leg continues to be very painful with dependency and swells quite a bite.  Symptoms are much better with elevation.  The patient notes minimal edema in the morning which steadily worsens throughout the day.    The patient has not been using compression therapy at this point.  No SOB or pleuritic chest pains.  No cough or hemoptysis.  No blood per rectum or blood in any sputum.  No excessive bruising per the patient.   Current Meds  Medication Sig  . acetaminophen (TYLENOL) 500 MG tablet Take 500-1,000 mg by mouth every 6 (six) hours as needed for moderate pain or headache.  Marland Kitchen amLODipine (NORVASC) 5 MG tablet Take 1 tablet (5 mg total) by mouth daily.  Marland Kitchen amphetamine-dextroamphetamine (ADDERALL) 30 MG tablet Take 15 mg by mouth daily.  Marland Kitchen anastrozole (ARIMIDEX) 1 MG tablet Take 1 mg by mouth every Wednesday.  Marland Kitchen apixaban (ELIQUIS) 5 MG TABS tablet Take 1 tablet (5 mg total) by mouth 2 (two) times daily.  . balsalazide (COLAZAL) 750 MG capsule Take 2,250 mg by mouth 3 (three) times daily.  . Cholecalciferol (VITAMIN D3) 5000 units CAPS Take 5,000 Units by mouth daily.  . Coenzyme Q10 (CO Q-10) 100 MG CAPS Take 100 mg by mouth daily.  Marland Kitchen DHEA 25 MG tablet Take 25 mg by mouth daily.   . hydrocortisone (CORTEF) 5 MG tablet Take 7.5 mg by mouth daily.  Marland Kitchen lisdexamfetamine (VYVANSE) 40 MG capsule Take 70 mg by mouth every morning.  . metoprolol (LOPRESSOR) 50 MG tablet Take 1 tablet (50 mg total) by mouth 2 (two) times daily. (Patient taking differently: Take 100 mg by mouth 2 (two) times  daily. )  . Multiple Vitamins-Minerals (MULTIVITAMIN PO) Take 1 tablet by mouth 2 (two) times daily.  Marland Kitchen OVER THE COUNTER MEDICATION Take 1 tablet by mouth 2 (two) times daily. Ultra Hepa Trope Supplement  . OVER THE COUNTER MEDICATION Take 1 tablet by mouth 2 (two) times daily. Inflamed Supplement  . tamsulosin (FLOMAX) 0.4 MG CAPS capsule Take 0.4 mg by mouth daily.  . Testosterone Cypionate 200 MG/ML KIT Inject 140 mg into the muscle See admin instructions. Inject 140 mg IM every 11 days  . traMADol (ULTRAM) 50 MG tablet Take 1 tablet (50 mg total) by mouth every 6 (six) hours as needed for moderate pain.  Marland Kitchen venlafaxine XR (EFFEXOR-XR) 150 MG 24 hr capsule Take 225 mg by mouth daily. Pt has 150 mg and 75 mg = 225 mg    Past Medical History:  Diagnosis Date  . ADD (attention deficit disorder with hyperactivity)   . Arthritis   . History of blood clots    Left Leg  . History of kidney stones   . Hypertension   . OCD (obsessive compulsive disorder)   . Ulcerative colitis     Past Surgical History:  Procedure Laterality Date  . COLONOSCOPY      multiple ;Dr Vira Agar  . CYSTOSCOPY N/A 12/22/2017   Procedure: CYSTOSCOPY;  Surgeon: Abbie Sons,  MD;  Location: [facility] ORS;  Service: Urology;  Laterality: N/A;  . FOREIGN BODY REMOVAL N/A 12/22/2017   Procedure: Bladder & urethral FOREIGN BODY REMOVAL ADULT;  Surgeon: Abbie Sons, MD;  Location: [facility] OR[facility];  Service: Urology;  Laterality: N/A;  . HERNIA REPAIR    . INGUINAL HERNIA REPAIR  04/10/2010   Bilateral  . LITHOTRIPSY     x2; Dr Darlys Gales, [place]  . SHOULDER ARTHROSCOPY WITH OPEN ROTATOR CUFF REPAIR Right 10/28/2017   Procedure: SHOULDER ARTHROSCOPY WITH OPEN ROTATOR CUFF REPAIR, SUBACROMIANAL DECOMPRESSION AND DISTAL CLAVICLE EXCISION;  Surgeon: Thornton Park, MD;  Location: [facility] ORS;  Service: Orthopedics;  Laterality: Right;  . TONSILLECTOMY      Social History Social History   Tobacco Use  . Smoking status: Former  Smoker    Types: Cigarettes    Last attempt to quit: 12/14/1984    Years since quitting: 33.2  . Smokeless tobacco: Never Used  Substance Use Topics  . Alcohol use: Yes    Comment:  1 wine or  beer/ day or less  . Drug use: No    Family History Family History  Problem Relation Age of Onset  . Heart attack Father 66  . Hypertension Father   . Rheumatic fever Father   . Sudden death Father   . Cancer Sister        Larynx-smoker  . Hypertension Sister   . Heart attack Maternal Uncle        X2; > 55 yo  . Heart attack Paternal Grandfather        > 55  . Dementia Mother   . Diabetes Neg Hx   . Hyperlipidemia Neg Hx     Allergies  Allergen Reactions  . Codeine Rash and Other (See Comments)    Only in high doses  . Naproxen Sodium Other (See Comments)    REACTION: colitis flare  . Promethazine Hcl Other (See Comments)    REACTION: mental status changes  . Latex Rash     REVIEW OF SYSTEMS (Negative unless checked)  Constitutional: _0 Weight loss  _1 Fever  _2 Chills Cardiac: _3 Chest pain   _4 Chest pressure   _5 Palpitations   _6 Shortness of breath when laying flat   _7 Shortness of breath with exertion. Vascular:  _8 Pain in legs with walking   _9 Pain in legs at rest  _10 History of DVT   _11 Phlebitis   _12 Swelling in legs   _13 Varicose veins   _14 Non-healing ulcers Pulmonary:   _15 Uses home oxygen   _16 Productive cough   _17 Hemoptysis   _18 Wheeze  _19 COPD   _20 Asthma Neurologic:  _21 Dizziness   _22 Seizures   _23 History of stroke   _24 History of TIA  _25 Aphasia   _26 Vissual changes   _27 Weakness or numbness in arm   _28 Weakness or numbness in leg Musculoskeletal:   _29 Joint swelling   _30 Joint pain   _31 Low back pain Hematologic:  _32 Easy bruising  _33 Easy bleeding   _34 Hypercoagulable state   _35 Anemic Gastrointestinal:  _36 Diarrhea   _37 Vomiting  _38 Gastroesophageal reflux/heartburn   _39 Difficulty swallowing. Genitourinary:  _40 Chronic kidney disease   _41 Difficult urination  _42 Frequent urination   _43 Blood  in urine Skin:  _44 Rashes   _45 Ulcers  Psychological:  _46 History of anxiety   _47  History of major depression.  Physical Examination  Vitals:   03/03/18 1603  BP: 136/87  Pulse: 82  Resp: 15  Weight: 220 lb (99.8 kg)  Height: _48  (1.803 m)   Body mass index is 30.68 kg/m. Gen: WD/WN, NAD Head: [place]/AT, No  temporalis wasting.  Ear/Nose/Throat: Hearing grossly intact, nares w/o erythema or drainage Eyes: PER, EOMI, sclera nonicteric.  Neck: Supple, no large masses.   Pulmonary:  Good air movement, no audible wheezing bilaterally, no use of accessory muscles.  Cardiac: RRR, no JVD Vascular: scattered varicosities present bilaterally.  Mild venous stasis changes to the legs bilaterally.  2+ soft pitting edema Vessel Right Left  Radial Palpable Palpable  PT Palpable Palpable  DP Palpable Palpable  Gastrointestinal: Non-distended. No guarding/no peritoneal signs.  Musculoskeletal: M/S 5/5 throughout.  No deformity or atrophy.  Neurologic: CN 2-12 intact. Symmetrical.  Speech is fluent. Motor exam as listed above. Psychiatric: Judgment intact, Mood & affect appropriate for pt's clinical situation. Dermatologic: mild venous rashes no ulcers noted.  No changes consistent with cellulitis. Lymph : No lichenification or skin changes of chronic lymphedema.  CBC Lab Results  Component Value Date   WBC 5.5 10/21/2017   HGB 17.4 10/21/2017   HCT 50.0 10/21/2017   MCV 90.2 10/21/2017   PLT 158 10/21/2017    BMET    Component Value Date/Time   NA 138 10/21/2017 0926   NA 143 08/07/2014 1450   K 4.2 10/21/2017 0926   K 4.0 08/07/2014 1450   CL 102 10/21/2017 0926   CL 107 08/07/2014 1450   CO2 27 10/21/2017 0926   CO2 27 08/07/2014 1450   GLUCOSE 106 (H) 10/21/2017 0926   GLUCOSE 102 (H) 08/07/2014 1450   BUN 27 (H) 10/21/2017 0926   BUN 21 (H) 08/07/2014 1450   CREATININE 1.14 10/21/2017 0926   CREATININE 1.20 08/07/2014 1450   CALCIUM 9.6 10/21/2017 0926   CALCIUM 9.0  08/07/2014 1450   GFRNONAA >60 10/21/2017 0926   GFRNONAA >60 08/07/2014 1450   GFRAA >60 10/21/2017 0926   GFRAA >60 08/07/2014 1450   CrCl cannot be calculated (Patient's most recent lab result is older than the maximum 21 days allowed.).  COAG Lab Results  Component Value Date   INR 0.95 10/21/2017   INR 2.5 02/21/2014   INR 2.8 02/06/2014    Radiology No results found.    Assessment/Plan 1. Deep vein thrombosis (DVT) of femoral vein of left lower extremity, unspecified chronicity (HCC) Recommend:   No surgery or intervention at this point in time.  IVC filter is not indicated at present.  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  The patient is on anticoagulation   Elevation was stressed, use of a recliner was discussed.  I have had a long discussion with the patient regarding DVT and post phlebitic changes such as swelling and why it  causes symptoms such as pain.  The patient will wear graduated compression stockings class 1 (20-30 mmHg), beginning after three full days of anticoagulation, on a daily basis a prescription was given. The patient will  beginning wearing the stockings first thing in the morning and removing them in the evening. The patient is instructed specifically not to sleep in the stockings.  In addition, behavioral modification including elevation during the day and avoidance of prolonged dependency will be initiated.    The patient will continue anticoagulation for now as there have not been any problems or complications at this point.    2. Hyperlipidemia, unspecified hyperlipidemia type Continue statin as ordered and reviewed, no changes at this time   3. Essential hypertension Continue antihypertensive medications as already ordered, these medications have been reviewed and there are no  changes at this time.     Hortencia Pilar, MD  03/06/2018 11:49 AM

## 2018-07-04 ENCOUNTER — Encounter
Admission: RE | Admit: 2018-07-04 | Discharge: 2018-07-04 | Disposition: A | Discharge status: Home / Self Care | Payer: BLUE CROSS/BLUE SHIELD | Source: Ambulatory Visit | Attending: Internal Medicine | Admitting: Internal Medicine

## 2018-07-05 ENCOUNTER — Other Ambulatory Visit: Payer: Self-pay

## 2018-07-05 MED ORDER — OXYCODONE HCL 10 MG PO TABS: 10.0000 mg | ORAL_TABLET | ORAL | 0 refills | Status: AC | PRN

## 2018-07-05 NOTE — Telephone Encounter (Signed)
Rx sent to [facility] phone : [phone] , fax : [phone]  

## 2018-07-11 ENCOUNTER — Other Ambulatory Visit: Payer: Self-pay

## 2018-07-11 MED ORDER — OXYCODONE HCL 10 MG PO TABS: 10.0000 mg | ORAL_TABLET | ORAL | 0 refills | Status: DC | PRN

## 2018-07-11 NOTE — Telephone Encounter (Signed)
Rx sent to [facility] phone : [phone] , fax : [phone]  

## 2018-07-12 ENCOUNTER — Encounter: Payer: Self-pay | Admitting: Adult Health

## 2018-07-12 ENCOUNTER — Non-Acute Institutional Stay (SKILLED_NURSING_FACILITY): Payer: BLUE CROSS/BLUE SHIELD | Admitting: Adult Health

## 2018-07-12 DIAGNOSIS — I82412 Acute embolism and thrombosis of left femoral vein: Secondary | ICD-10-CM | POA: Diagnosis not present

## 2018-07-12 DIAGNOSIS — F988 Other specified behavioral and emotional disorders with onset usually occurring in childhood and adolescence: Secondary | ICD-10-CM

## 2018-07-12 DIAGNOSIS — I213 ST elevation (STEMI) myocardial infarction of unspecified site: Secondary | ICD-10-CM | POA: Diagnosis not present

## 2018-07-12 DIAGNOSIS — G4700 Insomnia, unspecified: Secondary | ICD-10-CM | POA: Diagnosis not present

## 2018-07-12 NOTE — Progress Notes (Signed)
[facility]:  [facility] Room Number: 205A Place of Service:  SNF ([phone]) Provider:  Kenard Gower, NP  Patient Care Team: Chari Manning, MD as PCP - General (Family Medicine)  Extended Emergency Contact Information Primary Emergency Contact: Butler,Suzanne Address: [address]          [place] [phone] [place] of [place] Mobile Phone: [phone] Relation: Spouse  Code Status:  FULL  Goals of care: Advanced Directive information Advanced Directives 07/12/2018  Does Patient Have a Medical Advance Directive? No  Type of Advance Directive -  Does patient want to make changes to medical advance directive? -  Copy of Healthcare Power of Attorney in Chart? -  Would patient like information on creating a medical advance directive? No - Patient declined     Chief Complaint  Patient presents with  . Medical Management of Chronic Issues    Routine visit for short term rehab patient    HPI:  Pt is a 57 y.o. male seen today for medical management of chronic diseases. He was seen in the room today. He verbalized that his pain level is well-controlled. He reported sleeping well at night and does not need Melatonin.  He has been admitted to [facility] on  07/04/18 from a recent hospitalization S/P fall from a ladder. He was fixing a friend's roof and his foot slid in a wt area and used his hands to prevent his back from breaking. He sustained L1, T12 and bilateral wrist fractures. He had external fixation of bilateral distal radius fractures and I&D of the right wrist with appropriate post-op alignment on 06/06/18. He had a post-op STEMI in the PACU. Cardiology performed a LHC on 6/24 which revealed a chronically occluded RCA and an occluded LAD at the mid-vessel. Balloon angioplast and stent placement did not yield reflow. TEE on 06/07/18 showed LVEF of 35-40%. He was subsequently managed medically with ASA, Atorvastatin,  Metoprolol, and Sacubitril-valsartan. On 06/23/18, his Sacubitril-valsartan was discontinued secondary to increase in creatinine, and low dose Lisinopril was started on 07/02/18.  He was deemed not a candidate for surgery, thus a definitive fixation of his bilateral radius fractures was not done as scheduled on 06/22/18. Orthopedic surgery performed the surgery on 06/29/18 without complications. He is able to bear weight on his elbows less than 5 lbs. He will follow-up with surgery on 07/14/18.  He is on chronic anticoagulation due to Factor V Leiden and multiple previous LLE DVT    Past Medical History:  Diagnosis Date  . ADD (attention deficit disorder with hyperactivity)   . Arthritis   . History of blood clots    Left Leg  . History of kidney stones   . Hypertension   . OCD (obsessive compulsive disorder)   . Ulcerative colitis    Past Surgical History:  Procedure Laterality Date  . COLONOSCOPY      multiple ;Dr Mechele Collin  . CYSTOSCOPY N/A 12/22/2017   Procedure: CYSTOSCOPY;  Surgeon: Riki Altes, MD;  Location: [facility] ORS;  Service: Urology;  Laterality: N/A;  . FOREIGN BODY REMOVAL N/A 12/22/2017   Procedure: Bladder & urethral FOREIGN BODY REMOVAL ADULT;  Surgeon: Riki Altes, MD;  Location: [facility] ORS;  Service: Urology;  Laterality: N/A;  . HERNIA REPAIR    . INGUINAL HERNIA REPAIR  04/10/2010   Bilateral  . LITHOTRIPSY     x2; Dr Jamelle Rushing, [place]  . SHOULDER ARTHROSCOPY WITH OPEN ROTATOR CUFF REPAIR Right 10/28/2017  Procedure: SHOULDER ARTHROSCOPY WITH OPEN ROTATOR CUFF REPAIR, SUBACROMIANAL DECOMPRESSION AND DISTAL CLAVICLE EXCISION;  Surgeon: Juanell FairlyKrasinski, Kevin, MD;  Location: [facility] ORS;  Service: Orthopedics;  Laterality: Right;  . TONSILLECTOMY      Allergies  Allergen Reactions  . Codeine Rash and Other (See Comments)    Only in high doses Other reaction(s): Headache   . Latex Rash  . Naproxen Sodium Other (See Comments)    REACTION: colitis flare  .  Promethazine Hcl Other (See Comments)    REACTION: mental status changes, memory loss    Outpatient Encounter Medications as of 07/12/2018  Medication Sig  . acetaminophen (TYLENOL) 500 MG tablet Take 1,000 mg by mouth 3 (three) times daily.   Marland Kitchen. apixaban (ELIQUIS) 5 MG TABS tablet Take 1 tablet (5 mg total) by mouth 2 (two) times daily.  Marland Kitchen. aspirin 81 MG chewable tablet Chew 81 mg by mouth daily.  Marland Kitchen. atorvastatin (LIPITOR) 80 MG tablet Take 80 mg by mouth daily.  . balsalazide (COLAZAL) 750 MG capsule Take 2,250 mg by mouth 3 (three) times daily.   . Cholecalciferol (VITAMIN D3) 5000 units CAPS Take 5,000 Units by mouth daily.  . Coenzyme Q10 (CO Q-10) 100 MG CAPS Take 100 mg by mouth daily.  . cyclobenzaprine (FLEXERIL) 10 MG tablet Take 10 mg by mouth at bedtime as needed for muscle spasms.  Marland Kitchen. gabapentin (NEURONTIN) 300 MG capsule Take 300 mg by mouth 3 (three) times daily.  Marland Kitchen. lisinopril (PRINIVIL,ZESTRIL) 2.5 MG tablet Take 2.5 mg by mouth daily.  . Melatonin 3 MG TABS Take 1 tablet by mouth at bedtime as needed.   . metoprolol succinate (TOPROL-XL) 100 MG 24 hr tablet Take 100 mg by mouth daily. Take with or immediately following a meal.  . Multiple Vitamins-Minerals (THERAGRAN-M PREMIER 50 PLUS) TABS Take 1 tablet by mouth daily.  . Oxycodone HCl 10 MG TABS Take 1 tablet (10 mg total) by mouth every 4 (four) hours as needed.  . polyethylene glycol (MIRALAX / GLYCOLAX) packet Take 17 g by mouth 2 (two) times daily.  Marland Kitchen. senna (SENOKOT) 8.6 MG tablet Take 2 tablets by mouth 2 (two) times daily as needed.   . tamsulosin (FLOMAX) 0.4 MG CAPS capsule Take 0.4 mg by mouth daily.   . Venlafaxine HCl 225 MG TB24 Take 1 tablet by mouth daily.   No facility-administered encounter medications on file as of 07/12/2018.     Review of Systems  GENERAL: No change in appetite, no fatigue, no weight changes, no fever, chills or weakness MOUTH and THROAT: Denies oral discomfort, gingival pain or  bleeding RESPIRATORY: no cough, SOB, DOE, wheezing, hemoptysis CARDIAC: No chest pain, edema or palpitations GI: No abdominal pain, diarrhea, constipation, heart burn, nausea or vomiting GU: Denies dysuria, frequency, hematuria, incontinence, or discharge PSYCHIATRIC: Denies feelings of depression or anxiety. No report of hallucinations, insomnia, paranoia, or agitation   Immunization History  Administered Date(s) Administered  . PPD Test 07/05/2018  . Td 04/18/2009  . Tdap 06/05/2018   Pertinent  Health Maintenance Due  Topic Date Due  . INFLUENZA VACCINE  07/14/2018  . COLONOSCOPY  04/30/2021      Vitals:   07/12/18 1114  BP: 117/68  Pulse: 68  Resp: 18  Temp: 98.5 F (36.9 C)  TempSrc: Oral  SpO2: 97%  Weight: 204 lb 6.4 oz (92.7 kg)  Height: 5\' 11"  (1.803 m)   Body mass index is 28.51 kg/m.  Physical Exam  GENERAL APPEARANCE: Well nourished. In  no acute distress. Normal body habitus SKIN:  Bilateral wrist has dressings and splints.  MOUTH and THROAT: Lips are without lesions. Oral mucosa is moist and without lesions. Tongue is normal in shape, size, and color and without lesions RESPIRATORY: Breathing is even & unlabored, BS CTAB CARDIAC: RRR, no murmur,no extra heart sounds, no edema GI: Abdomen soft, normal BS, no masses, no tenderness, no hepatomegaly, no splenomegaly Extremities: No cyanosis, wears jewett brace  PSYCHIATRIC: Alert and oriented X 3. Affect and behavior are appropriate  Labs reviewed: Recent Labs    10/21/17 0926  NA 138  K 4.2  CL 102  CO2 27  GLUCOSE 106*  BUN 27*  CREATININE 1.14  CALCIUM 9.6   No results for input(s): AST, ALT, ALKPHOS, BILITOT, PROT, ALBUMIN in the last 8760 hours. Recent Labs    10/21/17 0926  WBC 5.5  NEUTROABS 2.7  HGB 17.4  HCT 50.0  MCV 90.2  PLT 158   Lab Results  Component Value Date   TSH 1.53 12/30/2012   No results found for: HGBA1C Lab Results  Component Value Date   CHOL 263 (H)  12/30/2012   HDL 61.10 12/30/2012   LDLCALC 87 04/18/2009   LDLDIRECT 167.6 12/30/2012   TRIG 121.0 12/30/2012   CHOLHDL 4 12/30/2012     Assessment/Plan  1. ST elevation myocardial infarction (STEMI), unspecified artery (HCC) - had a post-op STEMI in the PACU. Cardiology performed a LHC on 6/24 which revealed a chronically occluded RCA and an occluded LAD at the mid-vessel. Balloon angioplast and stent placement did not yield reflow. TEE on 06/07/18 showed LVEF of 35-40%. He was subsequently managed medically with ASA, Atorvastatin, Metoprolol, and Sacubitril-valsartan. On 06/23/18, his Sacubitril-valsartan was discontinued secondary to increase in creatinine, and low dose Lisinopril was started on 07/02/18.    2. Deep vein thrombosis (DVT) of femoral vein of left lower extremity, unspecified chronicity (HCC) - will continue Eliquis 5 mg BID   3. Insomnia, unspecified type - verbalized not needing Melatonin so it will be discontinued   4. Attention deficit disorder, unspecified hyperactivity presence - percardiology his Lisdexamfetamine and Dextroamphetamine-amphetamine were held and if stimulants are restarted, he would need Lifevest for VT/VF prophylaxis. He is not currently a candidate for ICD, follows up with Dr. Jennelle Human, psychiatrist     Family/ staff Communication: Discussed plan of care with patient.  Labs/tests ordered:  None  Goals of care:   Short-term care   Kenard Gower, NP [facility] [phone] (Monday-Friday 8:00 a.m. - 5:00 p.m.) [phone] (after hours)

## 2018-07-14 ENCOUNTER — Encounter
Admission: RE | Admit: 2018-07-14 | Discharge: 2018-07-14 | Disposition: A | Discharge status: Home / Self Care | Payer: BLUE CROSS/BLUE SHIELD | Source: Ambulatory Visit | Attending: Internal Medicine | Admitting: Internal Medicine

## 2018-08-18 ENCOUNTER — Telehealth: Payer: Self-pay

## 2018-08-18 ENCOUNTER — Other Ambulatory Visit: Payer: Self-pay

## 2018-08-18 MED ORDER — TAMSULOSIN HCL 0.4 MG PO CAPS: 0.4000 mg | ORAL_CAPSULE | Freq: Every day | ORAL | 3 refills | Status: DC

## 2018-08-18 NOTE — Telephone Encounter (Signed)
Rx request from pharmacy for Tamsulosin received. Sent electronically.

## 2018-10-20 ENCOUNTER — Encounter: Payer: Self-pay | Admitting: Emergency Medicine

## 2018-10-20 ENCOUNTER — Other Ambulatory Visit: Payer: Self-pay

## 2018-10-20 DIAGNOSIS — F4010 Social phobia, unspecified: Secondary | ICD-10-CM | POA: Insufficient documentation

## 2018-10-20 NOTE — Telephone Encounter (Signed)
Requesting refill will fax to pharmacy

## 2018-10-21 ENCOUNTER — Other Ambulatory Visit: Payer: Self-pay

## 2018-11-07 ENCOUNTER — Ambulatory Visit (INDEPENDENT_AMBULATORY_CARE_PROVIDER_SITE_OTHER): Payer: BLUE CROSS/BLUE SHIELD | Admitting: Psychiatry

## 2018-11-07 ENCOUNTER — Encounter: Payer: Self-pay | Admitting: Psychiatry

## 2018-11-07 DIAGNOSIS — F331 Major depressive disorder, recurrent, moderate: Secondary | ICD-10-CM

## 2018-11-07 DIAGNOSIS — F4010 Social phobia, unspecified: Secondary | ICD-10-CM

## 2018-11-07 DIAGNOSIS — F900 Attention-deficit hyperactivity disorder, predominantly inattentive type: Secondary | ICD-10-CM

## 2018-11-07 DIAGNOSIS — F422 Mixed obsessional thoughts and acts: Secondary | ICD-10-CM

## 2018-11-07 MED ORDER — LISDEXAMFETAMINE DIMESYLATE 70 MG PO CAPS: 70.0000 mg | ORAL_CAPSULE | ORAL | 0 refills | Status: DC

## 2018-11-07 MED ORDER — LISDEXAMFETAMINE DIMESYLATE 70 MG PO CAPS: 70.0000 mg | ORAL_CAPSULE | Freq: Every day | ORAL | 0 refills | Status: DC

## 2018-11-07 NOTE — Patient Instructions (Signed)
Check BP on occasion in the morning before Vyvanse and then again during the day while on Vyvanse and that at the end of the day when the Vyvanse should have worn off.  Do this a few times and let us know what the readings were.

## 2018-11-07 NOTE — Progress Notes (Signed)
Nicholas Bonilla [id number] 14-Jul-1961 57 y.o.  Subjective:   Patient ID:  Nicholas Bonilla is a 57 y.o. (DOB 1960-12-23) male.  Chief Complaint:  Chief Complaint  Patient presents with  . ADHD  . Depression  . Anxiety    HPI Nicholas Bonilla presents to the office today for follow-up of depression anxiety, ADD and sleep problems. Getting ready to start heart rehab.  Larey Seat off a roof with multiple fractures.  The next day after the surgery he had a heart attack.  Card about 2 weeks ago about the prospect of the stimulants.  Really needs the stimulant for attention and focus.  Has returned to work.  He had some leftover Vyvanse and added it and mood was much improved and clear-headed.  Pt reports that mood is Depressed and describes anxiety as Minimal. Anxiety symptoms include: Excessive Worry,. Pt reports has interrupted sleep, is not rested upon awakening, has snoring and has daytime sleepiness. Pt reports that appetite is good. Pt reports that energy is lethargic and anhedonia, loss of interest or pleasure in usual activities and poor motivation. Concentration is poor. Suicidal thoughts:  denied by patient.  Complains of nonrestorative sleep.  He has been observed to snore loudly per family members.  He thinks may be he has had witnessed apnea.  He has to take a nap in the morning because he is still tired and sleepy after a full night sleep.  His concentration is poor.  He occasionally has headaches in the morning.  In consultation with Dr. Corena Herter on the phone in July Dr. Corena Herter, the cardiologist, had recommended a sleep study but his sleep study has not been done.   Review of Systems:  Review of Systems  Cardiovascular: Negative for chest pain and palpitations.  Musculoskeletal: Positive for arthralgias, back pain and neck stiffness.  Neurological: Negative for tremors and weakness.  Psychiatric/Behavioral: Positive for decreased concentration, dysphoric mood and sleep disturbance.  Negative for agitation, behavioral problems, confusion, hallucinations, self-injury and suicidal ideas. The patient is nervous/anxious. The patient is not hyperactive.   BP controlled  Medications: I have reviewed the patient's current medications.  Current Outpatient Medications  Medication Sig Dispense Refill  . acetaminophen (TYLENOL) 500 MG tablet Take 1,000 mg by mouth 3 (three) times daily.     Marland Kitchen amphetamine-dextroamphetamine (ADDERALL) 30 MG tablet Take 30 mg by mouth. 1/2 bid    . apixaban (ELIQUIS) 5 MG TABS tablet Take 1 tablet (5 mg total) by mouth 2 (two) times daily. 60 tablet 5  . aspirin 81 MG chewable tablet Chew 81 mg by mouth daily.    Marland Kitchen atorvastatin (LIPITOR) 80 MG tablet Take 80 mg by mouth daily.    . balsalazide (COLAZAL) 750 MG capsule Take 2,250 mg by mouth 3 (three) times daily.     . Cholecalciferol (VITAMIN D3) 5000 units CAPS Take 5,000 Units by mouth daily.    . Coenzyme Q10 (CO Q-10) 100 MG CAPS Take 100 mg by mouth daily.    . cyclobenzaprine (FLEXERIL) 10 MG tablet Take 10 mg by mouth at bedtime as needed for muscle spasms.    Marland Kitchen gabapentin (NEURONTIN) 300 MG capsule Take 300 mg by mouth 3 (three) times daily.    Marland Kitchen lisinopril (PRINIVIL,ZESTRIL) 2.5 MG tablet Take 2.5 mg by mouth daily.    . Melatonin 3 MG TABS Take 1 tablet by mouth at bedtime as needed.     . metoprolol succinate (TOPROL-XL) 100 MG 24 hr tablet Take 100  mg by mouth daily. Take with or immediately following a meal.    . modafinil (PROVIGIL) 200 MG tablet Take 200 mg by mouth daily.    . Multiple Vitamins-Minerals (THERAGRAN-M PREMIER 50 PLUS) TABS Take 1 tablet by mouth daily.    . Oxycodone HCl 10 MG TABS Take 1 tablet (10 mg total) by mouth every 4 (four) hours as needed. 60 tablet 0  . polyethylene glycol (MIRALAX / GLYCOLAX) packet Take 17 g by mouth 2 (two) times daily.    Marland Kitchen senna (SENOKOT) 8.6 MG tablet Take 2 tablets by mouth 2 (two) times daily as needed.     . tamsulosin (FLOMAX) 0.4  MG CAPS capsule Take 1 capsule (0.4 mg total) by mouth daily. 30 capsule 3  . Venlafaxine HCl 225 MG TB24 Take 1 tablet by mouth daily.     No current facility-administered medications for this visit.     Medication Side Effects: None  Allergies:  Allergies  Allergen Reactions  . Codeine Rash and Other (See Comments)    Only in high doses Other reaction(s): Headache   . Latex Rash  . Naproxen Sodium Other (See Comments)    REACTION: colitis flare  . Promethazine Hcl Other (See Comments)    REACTION: mental status changes, memory loss    Past Medical History:  Diagnosis Date  . ADD (attention deficit disorder with hyperactivity)   . Arthritis   . History of blood clots    Left Leg  . History of kidney stones   . Hypertension   . OCD (obsessive compulsive disorder)   . Ulcerative colitis     Family History  Problem Relation Age of Onset  . Heart attack Father 30  . Hypertension Father   . Rheumatic fever Father   . Sudden death Father   . Cancer Sister        Larynx-smoker  . Hypertension Sister   . Heart attack Maternal Uncle        X2; > 51 yo  . Heart attack Paternal Grandfather        > 55  . Dementia Mother   . Diabetes Neg Hx   . Hyperlipidemia Neg Hx     Social History   Socioeconomic History  . Marital status: Married    Spouse name: [name]  . Number of children: 0  . Years of education: 12+  . Highest education level: Some college, no degree  Occupational History  . Not on file  Social Needs  . Financial resource strain: Not on file  . Food insecurity:    Worry: Not on file    Inability: Not on file  . Transportation needs:    Medical: Not on file    Non-medical: Not on file  Tobacco Use  . Smoking status: Former Smoker    Types: Cigarettes    Last attempt to quit: 12/14/1984    Years since quitting: 33.9  . Smokeless tobacco: Never Used  Substance and Sexual Activity  . Alcohol use: Yes    Comment:  1 wine or  beer/ day or less  .  Drug use: No  . Sexual activity: Not on file  Lifestyle  . Physical activity:    Days per week: Not on file    Minutes per session: Not on file  . Stress: Not on file  Relationships  . Social connections:    Talks on phone: Not on file    Gets together: Not on file  Attends religious service: Not on file    Active member of club or organization: Not on file    Attends meetings of clubs or organizations: Not on file    Relationship status: Not on file  . Intimate partner violence:    Fear of current or ex partner: Not on file    Emotionally abused: Not on file    Physically abused: Not on file    Forced sexual activity: Not on file  Other Topics Concern  . Not on file  Social History Narrative   [place]   Married with no children   Occasional drinks   Former smoker quit 1986   Full Code    Past Medical History, Surgical history, Social history, and Family history were reviewed and updated as appropriate.   Please see review of systems for further details on the patient's review from today.   Objective:   Physical Exam:  There were no vitals taken for this visit.  Physical Exam  Constitutional: He is oriented to person, place, and time. He appears well-developed. No distress.  Musculoskeletal: He exhibits no deformity.  Neurological: He is alert and oriented to person, place, and time. He displays no tremor. Coordination and gait normal.  Psychiatric: His speech is normal and behavior is normal. Judgment and thought content normal. His affect is blunt. His affect is not angry, not labile and not inappropriate. Cognition and memory are normal. He exhibits a depressed mood. He expresses no homicidal and no suicidal ideation. He expresses no suicidal plans and no homicidal plans.  Insight intact. No auditory or visual hallucinations. No delusions.  He is attentive.    Lab Review:     Component Value Date/Time   NA 138 10/21/2017 0926   NA 143 08/07/2014  1450   K 4.2 10/21/2017 0926   K 4.0 08/07/2014 1450   CL 102 10/21/2017 0926   CL 107 08/07/2014 1450   CO2 27 10/21/2017 0926   CO2 27 08/07/2014 1450   GLUCOSE 106 (H) 10/21/2017 0926   GLUCOSE 102 (H) 08/07/2014 1450   BUN 27 (H) 10/21/2017 0926   BUN 21 (H) 08/07/2014 1450   CREATININE 1.14 10/21/2017 0926   CREATININE 1.20 08/07/2014 1450   CALCIUM 9.6 10/21/2017 0926   CALCIUM 9.0 08/07/2014 1450   PROT 7.0 08/07/2014 1450   ALBUMIN 3.8 08/07/2014 1450   AST 29 08/07/2014 1450   ALT 33 08/07/2014 1450   ALKPHOS 58 08/07/2014 1450   BILITOT 0.4 08/07/2014 1450   GFRNONAA >60 10/21/2017 0926   GFRNONAA >60 08/07/2014 1450   GFRAA >60 10/21/2017 0926   GFRAA >60 08/07/2014 1450       Component Value Date/Time   WBC 5.5 10/21/2017 0926   RBC 5.55 10/21/2017 0926   HGB 17.4 10/21/2017 0926   HGB 16.6 08/07/2014 1450   HCT 50.0 10/21/2017 0926   HCT 48.3 08/07/2014 1450   PLT 158 10/21/2017 0926   PLT 178 08/07/2014 1450   MCV 90.2 10/21/2017 0926   MCV 91 08/07/2014 1450   MCH 31.3 10/21/2017 0926   MCHC 34.8 10/21/2017 0926   RDW 13.1 10/21/2017 0926   RDW 12.5 08/07/2014 1450   LYMPHSABS 2.0 10/21/2017 0926   LYMPHSABS 1.8 08/07/2014 1450   MONOABS 0.5 10/21/2017 0926   MONOABS 0.5 08/07/2014 1450   EOSABS 0.2 10/21/2017 0926   EOSABS 0.1 08/07/2014 1450   BASOSABS 0.1 10/21/2017 0926   BASOSABS 0.1 08/07/2014 1450  No results found for: POCLITH, LITHIUM   No results found for: PHENYTOIN, PHENOBARB, VALPROATE, CBMZ   .res Assessment: Plan:    Major depressive disorder, recurrent episode, moderate (HCC)  Attention deficit hyperactivity disorder (ADHD), predominantly inattentive type  Social anxiety disorder  Mixed obsessional thoughts and acts   [name] has uncontrolled depression and poor attention and focus and probably sleep apnea that is undiagnosed and untreated. Rec sleep study to RO [facility] which is likely.  Cardiologist Dr. Corena Herter also  recommended this on June 24, 2018.  Educated him about the dangers of untreated sleep apnea and and especially in association with his underlying heart disease.  Reports the Vyvanse had a much better mood and conc effect with the Vyvanse even over the modafanil.  He wants to restart the Vyvanse 70mg .  Disc cardiac effects.  Monitor BP on the med and call us back. Stop Modafinil.  We are hopeful that the Vyvanse will help not only his focus but also his mood as it did seem to do so in the past.  If mood is still depressed when he comes back we need to address that by different means such as an alternative antidepressant.  I would prefer not to have so many changes at once we will defer changing the antidepressant.  This appt was 30 mins.  He agrees to this plan  FU 8 weeks  Meredith Staggers, MD, DFAPA   Please see After Visit Summary for patient specific instructions.  No future appointments.  No orders of the defined types were placed in this encounter.     -------------------------------

## 2018-12-22 ENCOUNTER — Other Ambulatory Visit: Payer: Self-pay

## 2018-12-23 ENCOUNTER — Other Ambulatory Visit: Payer: Self-pay | Admitting: Family Medicine

## 2018-12-23 ENCOUNTER — Telehealth: Payer: Self-pay | Admitting: Urology

## 2018-12-23 MED ORDER — TAMSULOSIN HCL 0.4 MG PO CAPS: 0.4000 mg | ORAL_CAPSULE | Freq: Every day | ORAL | 3 refills | Status: DC

## 2018-12-23 NOTE — Telephone Encounter (Signed)
RX filled.

## 2018-12-23 NOTE — Telephone Encounter (Signed)
Pharmacy called asking for refill on 0.4mg  Tamsulosin pt has been out. Please advise. Thanks.

## 2019-01-03 ENCOUNTER — Other Ambulatory Visit: Payer: Self-pay | Admitting: Psychiatry

## 2019-01-03 DIAGNOSIS — F900 Attention-deficit hyperactivity disorder, predominantly inattentive type: Secondary | ICD-10-CM

## 2019-01-03 NOTE — Telephone Encounter (Signed)
Please send. Next ov 01/09/2019

## 2019-01-09 ENCOUNTER — Encounter: Payer: Self-pay | Admitting: Psychiatry

## 2019-01-09 ENCOUNTER — Ambulatory Visit (INDEPENDENT_AMBULATORY_CARE_PROVIDER_SITE_OTHER): Payer: BLUE CROSS/BLUE SHIELD | Admitting: Psychiatry

## 2019-01-09 DIAGNOSIS — F900 Attention-deficit hyperactivity disorder, predominantly inattentive type: Secondary | ICD-10-CM

## 2019-01-09 DIAGNOSIS — F331 Major depressive disorder, recurrent, moderate: Secondary | ICD-10-CM

## 2019-01-09 DIAGNOSIS — F4010 Social phobia, unspecified: Secondary | ICD-10-CM

## 2019-01-09 DIAGNOSIS — F422 Mixed obsessional thoughts and acts: Secondary | ICD-10-CM | POA: Diagnosis not present

## 2019-01-09 DIAGNOSIS — G4733 Obstructive sleep apnea (adult) (pediatric): Secondary | ICD-10-CM

## 2019-01-09 MED ORDER — LISDEXAMFETAMINE DIMESYLATE 70 MG PO CAPS: 70.0000 mg | ORAL_CAPSULE | Freq: Every day | ORAL | 0 refills | Status: DC

## 2019-01-09 NOTE — Progress Notes (Signed)
Nicholas Bonilla [id number] Feb 17, 1961 58 y.o.  Subjective:   Patient ID:  Nicholas Bonilla is a 58 y.o. (DOB Oct 20, 1961) male.  Chief Complaint:  Chief Complaint  Patient presents with  . Follow-up    Medication Management    HPI last seen November 07, 2018 Nicholas Bonilla presents to the office today for follow-up of depression anxiety, ADD and sleep problems.  Patient reports stable mood and denies depressed or irritable moods.  Patient denies any recent difficulty with anxiety.  Patient denies difficulty with sleep initiation or maintenance, sleep to bed 10:30 and start waking about 8 hour.  Not napping.. Denies appetite disturbance.  Patient reports that energy and motivation have been good.  Patient denies any difficulty with concentration.  Patient denies any suicidal ideation.  Cardiac rehab helping energy and alertness more than even the Vyvanse.  Last visit restarted Vyvanse and it's working very well.  Pleased with it.  Adderall less effective.   June 23, Larey Seat off a roof with multiple fractures.  The next day after the surgery he had a heart attack.  Card about 2 weeks ago about the prospect of the stimulants.  Really needs the stimulant for attention and focus.  Has returned to work.  He had some leftover Vyvanse and added it and mood was much improved and clear-headed.  Review of Systems:  Review of Systems  Cardiovascular: Negative for chest pain and palpitations.  Musculoskeletal: Positive for arthralgias, back pain and neck stiffness.  Neurological: Negative for tremors and weakness.  Psychiatric/Behavioral: Negative for agitation, behavioral problems, confusion, decreased concentration, dysphoric mood, hallucinations, self-injury, sleep disturbance and suicidal ideas. The patient is not nervous/anxious and is not hyperactive.   BP controlled  Medications: I have reviewed the patient's current medications.  Current Outpatient Medications  Medication Sig Dispense Refill   . acetaminophen (TYLENOL) 500 MG tablet Take 1,000 mg by mouth 3 (three) times daily.     Marland Kitchen apixaban (ELIQUIS) 5 MG TABS tablet Take 1 tablet (5 mg total) by mouth 2 (two) times daily. 60 tablet 5  . aspirin 81 MG chewable tablet Chew 81 mg by mouth daily.    Marland Kitchen atorvastatin (LIPITOR) 80 MG tablet Take 80 mg by mouth daily.    . balsalazide (COLAZAL) 750 MG capsule Take 2,250 mg by mouth 3 (three) times daily.     . Cholecalciferol (VITAMIN D3) 5000 units CAPS Take 5,000 Units by mouth daily.    . Coenzyme Q10 (CO Q-10) 100 MG CAPS Take 100 mg by mouth daily.    Marland Kitchen DHEA 25 MG CAPS Take by mouth.    . lisdexamfetamine (VYVANSE) 70 MG capsule Take 1 capsule (70 mg total) by mouth daily. 30 capsule 0  . lisinopril (PRINIVIL,ZESTRIL) 2.5 MG tablet Take 2.5 mg by mouth daily.    . metoprolol succinate (TOPROL-XL) 100 MG 24 hr tablet Take 100 mg by mouth daily. Take with or immediately following a meal.    . Multiple Vitamins-Minerals (THERAGRAN-M PREMIER 50 PLUS) TABS Take 1 tablet by mouth daily.    . tamsulosin (FLOMAX) 0.4 MG CAPS capsule Take 1 capsule (0.4 mg total) by mouth daily. 30 capsule 3  . Venlafaxine HCl 225 MG TB24 Take 1 tablet by mouth daily.    . cyclobenzaprine (FLEXERIL) 10 MG tablet Take 10 mg by mouth at bedtime as needed for muscle spasms.    Melene Muller ON 02/06/2019] lisdexamfetamine (VYVANSE) 70 MG capsule Take 1 capsule (70 mg total) by mouth daily.  30 capsule 0  . [START ON 03/06/2019] lisdexamfetamine (VYVANSE) 70 MG capsule Take 1 capsule (70 mg total) by mouth daily. 30 capsule 0  . Oxycodone HCl 10 MG TABS Take 1 tablet (10 mg total) by mouth every 4 (four) hours as needed. (Patient not taking: Reported on 01/09/2019) 60 tablet 0  . polyethylene glycol (MIRALAX / GLYCOLAX) packet Take 17 g by mouth 2 (two) times daily.     No current facility-administered medications for this visit.     Medication Side Effects: None  Allergies:  Allergies  Allergen Reactions  .  Codeine Rash and Other (See Comments)    Only in high doses Other reaction(s): Headache   . Latex Rash  . Naproxen Sodium Other (See Comments)    REACTION: colitis flare  . Promethazine Hcl Other (See Comments)    REACTION: mental status changes, memory loss    Past Medical History:  Diagnosis Date  . ADD (attention deficit disorder with hyperactivity)   . Arthritis   . History of blood clots    Left Leg  . History of kidney stones   . Hypertension   . OCD (obsessive compulsive disorder)   . Ulcerative colitis     Family History  Problem Relation Age of Onset  . Heart attack Father [id number]  . Hypertension Father   . Rheumatic fever Father   . Sudden death Father   . Cancer Sister        Larynx-smoker  . Hypertension Sister   . Heart attack Maternal Uncle        X2; > 58 yo  . Heart attack Paternal Grandfather        > 55  . Dementia Mother   . Diabetes Neg Hx   . Hyperlipidemia Neg Hx     Social History   Socioeconomic History  . Marital status: Married    Spouse name: Nicholas Bonilla  . Number of children: 0  . Years of education: 12+  . Highest education level: Some college, no degree  Occupational History  . Not on file  Social Needs  . Financial resource strain: Not on file  . Food insecurity:    Worry: Not on file    Inability: Not on file  . Transportation needs:    Medical: Not on file    Non-medical: Not on file  Tobacco Use  . Smoking status: Former Smoker    Types: Cigarettes    Last attempt to quit: 12/14/1984    Years since quitting: 34.0  . Smokeless tobacco: Never Used  Substance and Sexual Activity  . Alcohol use: Yes    Comment:  1 wine or  beer/ day or less  . Drug use: No  . Sexual activity: Not on file  Lifestyle  . Physical activity:    Days per week: Not on file    Minutes per session: Not on file  . Stress: Not on file  Relationships  . Social connections:    Talks on phone: Not on file    Gets together: Not on file    Attends  religious service: Not on file    Active member of club or organization: Not on file    Attends meetings of clubs or organizations: Not on file    Relationship status: Not on file  . Intimate partner violence:    Fear of current or ex partner: Not on file    Emotionally abused: Not on file    Physically abused: Not on  file    Forced sexual activity: Not on file  Other Topics Concern  . Not on file  Social History Narrative   From [facility]   Married with no children   Occasional drinks   Former smoker quit 1986   Full Code    Past Medical History, Surgical history, Social history, and Family history were reviewed and updated as appropriate.   Please see review of systems for further details on the patient's review from today.   Objective:   Physical Exam:  There were no vitals taken for this visit.  Physical Exam Constitutional:      General: He is not in acute distress.    Appearance: He is well-developed.  Musculoskeletal:        General: No deformity.  Neurological:     Mental Status: He is alert and oriented to person, place, and time.     Motor: No tremor.     Coordination: Coordination normal.     Gait: Gait normal.  Psychiatric:        Attention and Perception: Attention and perception normal. He is attentive.        Mood and Affect: Mood is not depressed. Affect is not labile, blunt, angry or inappropriate.        Speech: Speech normal.        Behavior: Behavior normal.        Thought Content: Thought content normal. Thought content does not include homicidal or suicidal ideation. Thought content does not include homicidal or suicidal plan.        Cognition and Memory: Cognition normal.        Judgment: Judgment normal.     Comments: Insight intact. No auditory or visual hallucinations. No delusions.      Lab Review:     Component Value Date/Time   NA 138 10/21/2017 0926   NA 143 08/07/2014 1450   K 4.2 10/21/2017 0926   K 4.0 08/07/2014 1450    CL 102 10/21/2017 0926   CL 107 08/07/2014 1450   CO2 27 10/21/2017 0926   CO2 27 08/07/2014 1450   GLUCOSE 106 (H) 10/21/2017 0926   GLUCOSE 102 (H) 08/07/2014 1450   BUN 27 (H) 10/21/2017 0926   BUN 21 (H) 08/07/2014 1450   CREATININE 1.14 10/21/2017 0926   CREATININE 1.20 08/07/2014 1450   CALCIUM 9.6 10/21/2017 0926   CALCIUM 9.0 08/07/2014 1450   PROT 7.0 08/07/2014 1450   ALBUMIN 3.8 08/07/2014 1450   AST 29 08/07/2014 1450   ALT 33 08/07/2014 1450   ALKPHOS 58 08/07/2014 1450   BILITOT 0.4 08/07/2014 1450   GFRNONAA >60 10/21/2017 0926   GFRNONAA >60 08/07/2014 1450   GFRAA >60 10/21/2017 0926   GFRAA >60 08/07/2014 1450       Component Value Date/Time   WBC 5.5 10/21/2017 0926   RBC 5.55 10/21/2017 0926   HGB 17.4 10/21/2017 0926   HGB 16.6 08/07/2014 1450   HCT 50.0 10/21/2017 0926   HCT 48.3 08/07/2014 1450   PLT 158 10/21/2017 0926   PLT 178 08/07/2014 1450   MCV 90.2 10/21/2017 0926   MCV 91 08/07/2014 1450   MCH 31.3 10/21/2017 0926   MCHC 34.8 10/21/2017 0926   RDW 13.1 10/21/2017 0926   RDW 12.5 08/07/2014 1450   LYMPHSABS 2.0 10/21/2017 0926   LYMPHSABS 1.8 08/07/2014 1450   MONOABS 0.5 10/21/2017 0926   MONOABS 0.5 08/07/2014 1450   EOSABS 0.2 10/21/2017 0926  EOSABS 0.1 08/07/2014 1450   BASOSABS 0.1 10/21/2017 0926   BASOSABS 0.1 08/07/2014 1450    No results found for: POCLITH, LITHIUM   No results found for: PHENYTOIN, PHENOBARB, VALPROATE, CBMZ  Obstructive sleep apnea results discussed in detail he has very mild sleep apnea with an AHI of 8.5.  He could consider CPAP but is not likely to make a huge difference.  We will reserve this option for later. .res Assessment: Plan:    Major depressive disorder, recurrent episode, moderate (HCC)  Attention deficit hyperactivity disorder (ADHD), predominantly inattentive type - Plan: lisdexamfetamine (VYVANSE) 70 MG capsule, lisdexamfetamine (VYVANSE) 70 MG capsule, lisdexamfetamine (VYVANSE)  70 MG capsule  Social anxiety disorder  Mixed obsessional thoughts and acts  Mild obstructive sleep apnea   Nicholas Bonilla has had uncontrolled depression and poor attention and focus.  Reports the Vyvanse had a much better mood and conc effect with the Vyvanse even over the modafanil.  He wants to continue the Vyvanse 70mg .  Disc cardiac effects.  Monitor BP on the med and call us back.  Both his mood and focus are better on the Vyvanse.  No med changes  Obstructive sleep apnea results discussed in detail he has very mild sleep apnea with an AHI of 8.5.  He could consider CPAP but is not likely to make a huge difference.  We will reserve this option for later.  I would recommend he share his sleep study results with his cardiologist to see if the cardiologist has any further input.  Cardiologist Dr. Corena Herterosenstein also recommended the sleep study on June 24, 2018.  Educated him about the dangers of untreated sleep apnea and and especially in association with his underlying heart disease  He agrees to this plan  FU 4 mos  Meredith Staggersarey Cottle, MD, DFAPA   Please see After Visit Summary for patient specific instructions.  Future Appointments  Date Time Provider Department Center  05/29/2019  2:00 PM Cottle, Steva Readyarey G Jr., MD [facility] None    No orders of the defined types were placed in this encounter.     -------------------------------

## 2019-01-19 ENCOUNTER — Other Ambulatory Visit: Payer: Self-pay | Admitting: Psychiatry

## 2019-04-17 ENCOUNTER — Other Ambulatory Visit: Payer: Self-pay

## 2019-04-17 MED ORDER — TAMSULOSIN HCL 0.4 MG PO CAPS: 0.4000 mg | ORAL_CAPSULE | Freq: Every day | ORAL | 0 refills | Status: DC

## 2019-05-16 ENCOUNTER — Telehealth: Payer: Self-pay | Admitting: Urology

## 2019-05-16 DIAGNOSIS — N400 Enlarged prostate without lower urinary tract symptoms: Secondary | ICD-10-CM

## 2019-05-16 NOTE — Telephone Encounter (Signed)
Patient scheduled an appointment for 7/21.  He does not have enough Tamsulosin to get him through until this appointment.  He is requesting another 30-day supply to be sent to his pharmacy.  FYI - I offered him a sooner appointment, but it did not work with his schedule.  Please advise.

## 2019-05-17 MED ORDER — TAMSULOSIN HCL 0.4 MG PO CAPS: 0.4000 mg | ORAL_CAPSULE | Freq: Every day | ORAL | 0 refills | Status: DC

## 2019-05-17 NOTE — Telephone Encounter (Signed)
Limited supply of medication sent in. No further refills will be given.

## 2019-05-29 ENCOUNTER — Other Ambulatory Visit: Payer: Self-pay

## 2019-05-29 ENCOUNTER — Ambulatory Visit (INDEPENDENT_AMBULATORY_CARE_PROVIDER_SITE_OTHER): Payer: BLUE CROSS/BLUE SHIELD | Admitting: Psychiatry

## 2019-05-29 ENCOUNTER — Encounter: Payer: Self-pay | Admitting: Psychiatry

## 2019-05-29 DIAGNOSIS — G4733 Obstructive sleep apnea (adult) (pediatric): Secondary | ICD-10-CM

## 2019-05-29 DIAGNOSIS — F900 Attention-deficit hyperactivity disorder, predominantly inattentive type: Secondary | ICD-10-CM

## 2019-05-29 DIAGNOSIS — F331 Major depressive disorder, recurrent, moderate: Secondary | ICD-10-CM

## 2019-05-29 DIAGNOSIS — F422 Mixed obsessional thoughts and acts: Secondary | ICD-10-CM

## 2019-05-29 DIAGNOSIS — F4010 Social phobia, unspecified: Secondary | ICD-10-CM | POA: Diagnosis not present

## 2019-05-29 MED ORDER — LISDEXAMFETAMINE DIMESYLATE 70 MG PO CAPS: 70.0000 mg | ORAL_CAPSULE | Freq: Every day | ORAL | 0 refills | Status: DC

## 2019-05-29 NOTE — Progress Notes (Signed)
Nicholas CruiseRobert O Hoefle [id number] 11/03/1961 58 y.o.  Subjective:   Patient ID:  Nicholas Bonilla is a 58 y.o. (DOB 06/04/1961) male.  Chief Complaint:  Chief Complaint  Patient presents with  . Follow-up    Medication Management  . Anxiety    Medication Management  . Other    Medication Management    Anxiety Patient reports no chest pain, confusion, decreased concentration, nervous/anxious behavior, palpitations or suicidal ideas.     Nicholas Bonilla presents to the office today for follow-up of depression anxiety, ADD and sleep problems.  Last seen January 2020.  No med changes were made.  Doing pretty normal things.  Fall last June and then had heart attack.  Overall well except some expected bouts of loneliness and depression.  Biggest outlet has been church and Covid interfered..  Work is slow DT Covid. Patient reports stable mood and denies depressed or irritable moods.  Patient denies any recent difficulty with anxiety.  Patient denies difficulty with sleep initiation or maintenance, sleep to bed 10:30 and start waking about 8 hour.  Not napping.. Denies appetite disturbance.  Patient reports that energy and motivation have been good.  Patient denies any difficulty with concentration.  Patient denies any suicidal ideation.  Last year restarted Vyvanse and it's working very well but it's $216 month.  Pleased with it.  Adderall less effective.  Really needs the stimulant for attention and focus.  Has returned to work.  He had some leftover Vyvanse and added it and mood was much improved and clear-headed.  Past Psychiatric Medication Trials:  Adderall, modafinil, Adzenys, Prozac, citalopram, Daytrana,   Review of Systems:  Review of Systems  Cardiovascular: Negative for chest pain and palpitations.  Musculoskeletal: Positive for arthralgias, back pain and neck stiffness.  Neurological: Negative for tremors and weakness.  Psychiatric/Behavioral: Negative for agitation, behavioral  problems, confusion, decreased concentration, dysphoric mood, hallucinations, self-injury, sleep disturbance and suicidal ideas. The patient is not nervous/anxious and is not hyperactive.   BP controlled  Medications: I have reviewed the patient's current medications.  Current Outpatient Medications  Medication Sig Dispense Refill  . acetaminophen (TYLENOL) 500 MG tablet Take 1,000 mg by mouth 3 (three) times daily.     Marland Kitchen. apixaban (ELIQUIS) 5 MG TABS tablet Take 1 tablet (5 mg total) by mouth 2 (two) times daily. 60 tablet 5  . aspirin 81 MG chewable tablet Chew 81 mg by mouth daily.    Marland Kitchen. atorvastatin (LIPITOR) 80 MG tablet Take 80 mg by mouth daily.    . balsalazide (COLAZAL) 750 MG capsule Take 2,250 mg by mouth 3 (three) times daily.     . Cholecalciferol (VITAMIN D3) 5000 units CAPS Take 5,000 Units by mouth daily.    . Coenzyme Q10 (CO Q-10) 100 MG CAPS Take 100 mg by mouth daily.    Marland Kitchen. DHEA 25 MG CAPS Take by mouth.    . lisdexamfetamine (VYVANSE) 70 MG capsule Take 1 capsule (70 mg total) by mouth daily. 30 capsule 0  . [START ON 06/26/2019] lisdexamfetamine (VYVANSE) 70 MG capsule Take 1 capsule (70 mg total) by mouth daily. 30 capsule 0  . [START ON 07/24/2019] lisdexamfetamine (VYVANSE) 70 MG capsule Take 1 capsule (70 mg total) by mouth daily. 30 capsule 0  . lisinopril (PRINIVIL,ZESTRIL) 2.5 MG tablet Take 5 mg by mouth daily.     . metoprolol succinate (TOPROL-XL) 100 MG 24 hr tablet Take 100 mg by mouth daily. Take with or immediately following a meal.    .  Multiple Vitamins-Minerals (THERAGRAN-M PREMIER 50 PLUS) TABS Take 1 tablet by mouth daily.    . polyethylene glycol (MIRALAX / GLYCOLAX) packet Take 17 g by mouth 2 (two) times daily.    Marland Kitchen. Specialty Vitamins Products (PROSTATE PO) Take by mouth.    . tamsulosin (FLOMAX) 0.4 MG CAPS capsule Take 1 capsule (0.4 mg total) by mouth daily. 30 capsule 0  . venlafaxine XR (EFFEXOR-XR) 150 MG 24 hr capsule TAKE 1 CAPSULE BY MOUTH EVERY  MORNING 90 capsule 1  . venlafaxine XR (EFFEXOR-XR) 75 MG 24 hr capsule TAKE 1 CAPSULE BY MOUTH EVERY MORNING 90 capsule 1   No current facility-administered medications for this visit.     Medication Side Effects: None  Allergies:  Allergies  Allergen Reactions  . Codeine Rash and Other (See Comments)    Only in high doses Other reaction(s): Headache   . Latex Rash  . Naproxen Sodium Other (See Comments)    REACTION: colitis flare  . Promethazine Hcl Other (See Comments)    REACTION: mental status changes, memory loss    Past Medical History:  Diagnosis Date  . ADD (attention deficit disorder with hyperactivity)   . Arthritis   . History of blood clots    Left Leg  . History of kidney stones   . Hypertension   . OCD (obsessive compulsive disorder)   . Ulcerative colitis     Family History  Problem Relation Age of Onset  . Heart attack Father [id number]  . Hypertension Father   . Rheumatic fever Father   . Sudden death Father   . Cancer Sister        Larynx-smoker  . Hypertension Sister   . Heart attack Maternal Uncle        X2; > 58 yo  . Heart attack Paternal Grandfather        > 55  . Dementia Mother   . Diabetes Neg Hx   . Hyperlipidemia Neg Hx     Social History   Socioeconomic History  . Marital status: Married    Spouse name: Rosalita ChessmanSuzanne  . Number of children: 0  . Years of education: 12+  . Highest education level: Some college, no degree  Occupational History  . Not on file  Social Needs  . Financial resource strain: Not on file  . Food insecurity    Worry: Not on file    Inability: Not on file  . Transportation needs    Medical: Not on file    Non-medical: Not on file  Tobacco Use  . Smoking status: Former Smoker    Types: Cigarettes    Quit date: 12/14/1984    Years since quitting: 34.4  . Smokeless tobacco: Never Used  Substance and Sexual Activity  . Alcohol use: Yes    Comment:  1 wine or  beer/ day or less  . Drug use: No  . Sexual  activity: Not on file  Lifestyle  . Physical activity    Days per week: Not on file    Minutes per session: Not on file  . Stress: Not on file  Relationships  . Social Musicianconnections    Talks on phone: Not on file    Gets together: Not on file    Attends religious service: Not on file    Active member of club or organization: Not on file    Attends meetings of clubs or organizations: Not on file    Relationship status: Not on file  .  Intimate partner violence    Fear of current or ex partner: Not on file    Emotionally abused: Not on file    Physically abused: Not on file    Forced sexual activity: Not on file  Other Topics Concern  . Not on file  Social History Narrative   [place]   Married with no children   Occasional drinks   Former smoker quit 1986   Full Code    Past Medical History, Surgical history, Social history, and Family history were reviewed and updated as appropriate.   Please see review of systems for further details on the patient's review from today.   Objective:   Physical Exam:  There were no vitals taken for this visit.  Physical Exam Constitutional:      General: He is not in acute distress.    Appearance: He is well-developed.  Musculoskeletal:        General: No deformity.  Neurological:     Mental Status: He is alert and oriented to person, place, and time.     Motor: No tremor.     Coordination: Coordination normal.     Gait: Gait normal.  Psychiatric:        Attention and Perception: Attention and perception normal. He is attentive.        Mood and Affect: Mood is not depressed. Affect is not labile, blunt, angry or inappropriate.        Speech: Speech normal.        Behavior: Behavior normal.        Thought Content: Thought content normal. Thought content does not include homicidal or suicidal ideation. Thought content does not include homicidal or suicidal plan.        Cognition and Memory: Cognition normal.         Judgment: Judgment normal.     Comments: Insight intact. No auditory or visual hallucinations. No delusions.      Lab Review:     Component Value Date/Time   NA 138 10/21/2017 0926   NA 143 08/07/2014 1450   K 4.2 10/21/2017 0926   K 4.0 08/07/2014 1450   CL 102 10/21/2017 0926   CL 107 08/07/2014 1450   CO2 27 10/21/2017 0926   CO2 27 08/07/2014 1450   GLUCOSE 106 (H) 10/21/2017 0926   GLUCOSE 102 (H) 08/07/2014 1450   BUN 27 (H) 10/21/2017 0926   BUN 21 (H) 08/07/2014 1450   CREATININE 1.14 10/21/2017 0926   CREATININE 1.20 08/07/2014 1450   CALCIUM 9.6 10/21/2017 0926   CALCIUM 9.0 08/07/2014 1450   PROT 7.0 08/07/2014 1450   ALBUMIN 3.8 08/07/2014 1450   AST 29 08/07/2014 1450   ALT 33 08/07/2014 1450   ALKPHOS 58 08/07/2014 1450   BILITOT 0.4 08/07/2014 1450   GFRNONAA >60 10/21/2017 0926   GFRNONAA >60 08/07/2014 1450   GFRAA >60 10/21/2017 0926   GFRAA >60 08/07/2014 1450       Component Value Date/Time   WBC 5.5 10/21/2017 0926   RBC 5.55 10/21/2017 0926   HGB 17.4 10/21/2017 0926   HGB 16.6 08/07/2014 1450   HCT 50.0 10/21/2017 0926   HCT 48.3 08/07/2014 1450   PLT 158 10/21/2017 0926   PLT 178 08/07/2014 1450   MCV 90.2 10/21/2017 0926   MCV 91 08/07/2014 1450   MCH 31.3 10/21/2017 0926   MCHC 34.8 10/21/2017 0926   RDW 13.1 10/21/2017 0926   RDW 12.5 08/07/2014 1450  LYMPHSABS 2.0 10/21/2017 0926   LYMPHSABS 1.8 08/07/2014 1450   MONOABS 0.5 10/21/2017 0926   MONOABS 0.5 08/07/2014 1450   EOSABS 0.2 10/21/2017 0926   EOSABS 0.1 08/07/2014 1450   BASOSABS 0.1 10/21/2017 0926   BASOSABS 0.1 08/07/2014 1450    No results found for: POCLITH, LITHIUM   No results found for: PHENYTOIN, PHENOBARB, VALPROATE, CBMZ  Obstructive sleep apnea results discussed in detail he has very mild sleep apnea with an AHI of 8.5.  He could consider CPAP but is not likely to make a huge difference.  We will reserve this option for later. .res Assessment: Plan:     Dominick was seen today for follow-up, anxiety and other.  Diagnoses and all orders for this visit:  Major depressive disorder, recurrent episode, moderate (HCC)  Attention deficit hyperactivity disorder (ADHD), predominantly inattentive type -     lisdexamfetamine (VYVANSE) 70 MG capsule; Take 1 capsule (70 mg total) by mouth daily. -     lisdexamfetamine (VYVANSE) 70 MG capsule; Take 1 capsule (70 mg total) by mouth daily. -     lisdexamfetamine (VYVANSE) 70 MG capsule; Take 1 capsule (70 mg total) by mouth daily.  Social anxiety disorder  Mixed obsessional thoughts and acts  Mild obstructive sleep apnea    [name] has had uncontrolled depression and poor attention and focus.  Reports the Vyvanse had a much better mood and conc effect with the Vyvanse even over the modafanil.Has tried Adzenys and Adderall also.    He wants to continue the Vyvanse 70mg .  Disc cardiac effects.  Monitor BP on the med and call us back.  Both his mood and focus are better on the Vyvanse.  No med changes  Obstructive sleep apnea results discussed in detail he has very mild sleep apnea with an AHI of 8.5.  He could consider CPAP but is not likely to make a huge difference.  We will reserve this option for later.  I would recommend he share his sleep study results with his cardiologist to see if the cardiologist has any further input.  Cardiologist Dr. Becky [place] also recommended the sleep study on June 24, 2018.  Educated him about the dangers of untreated sleep apnea and and especially in association with his underlying heart disease  He agrees to this plan  FU 6 mos.  Lynder Parents, MD, DFAPA   Please see After Visit Summary for patient specific instructions.  Future Appointments  Date Time Provider [place]  07/04/2019  1:15 PM Bernardo Heater, Ronda [place], MD [facility] None    No orders of the defined types were placed in this encounter.     -------------------------------

## 2019-06-13 ENCOUNTER — Other Ambulatory Visit: Payer: Self-pay | Admitting: Family Medicine

## 2019-06-13 DIAGNOSIS — N400 Enlarged prostate without lower urinary tract symptoms: Secondary | ICD-10-CM

## 2019-06-13 MED ORDER — TAMSULOSIN HCL 0.4 MG PO CAPS: 0.4000 mg | ORAL_CAPSULE | Freq: Every day | ORAL | 0 refills | Status: AC

## 2019-06-23 ENCOUNTER — Telehealth: Payer: Self-pay

## 2019-06-23 ENCOUNTER — Telehealth: Payer: Self-pay | Admitting: Urology

## 2019-06-23 ENCOUNTER — Other Ambulatory Visit: Payer: Self-pay

## 2019-06-23 ENCOUNTER — Other Ambulatory Visit: Payer: BLUE CROSS/BLUE SHIELD

## 2019-06-23 DIAGNOSIS — R300 Dysuria: Secondary | ICD-10-CM

## 2019-06-23 DIAGNOSIS — N401 Enlarged prostate with lower urinary tract symptoms: Secondary | ICD-10-CM

## 2019-06-23 LAB — URINALYSIS, COMPLETE
Bilirubin, UA: NEGATIVE
Glucose, UA: NEGATIVE
Ketones, UA: NEGATIVE
Nitrite, UA: POSITIVE — AB
Specific Gravity, UA: 1.025 (ref 1.005–1.030)
Urobilinogen, Ur: 0.2 mg/dL (ref 0.2–1.0)
pH, UA: 6 (ref 5.0–7.5)

## 2019-06-23 LAB — MICROSCOPIC EXAMINATION
RBC: >30 /hpf — AB (ref 0–2)
WBC, UA: >30 /hpf — AB (ref 0–5)

## 2019-06-23 NOTE — Telephone Encounter (Signed)
We are no longer in network with pt's insurance and he would like to get a referral to [facility].

## 2019-06-23 NOTE — Telephone Encounter (Signed)
Patient called after clinic hours complaining of painful urination, tea colored urine with a foul odor. Dr Diamantina Providence sent in Bactrim BID x 10 days and instructed patient to drop off a urine for UA and CX the following day. Patient appeared today on LAB schedule for UA and CX. UA >30 WBC, >30 RBC, nitrate positive. Patient stated that he picked up antibiotics this morning. Will call with culture results.

## 2019-06-26 ENCOUNTER — Telehealth: Payer: Self-pay

## 2019-06-26 ENCOUNTER — Telehealth: Payer: Self-pay | Admitting: Urology

## 2019-06-26 LAB — CULTURE, URINE COMPREHENSIVE

## 2019-06-26 MED ORDER — GENERIC EXTERNAL MEDICATION
2.00 | Status: DC
Start: ? — End: 2019-06-26

## 2019-06-26 MED ORDER — POLYETHYLENE GLYCOL 3350 17 G PO PACK
17.00 | PACK | ORAL | Status: DC
Start: 2019-06-27 — End: 2019-06-26

## 2019-06-26 MED ORDER — BALSALAZIDE DISODIUM 750 MG PO CAPS
750.00 | ORAL_CAPSULE | ORAL | Status: DC
Start: 2019-06-26 — End: 2019-06-26

## 2019-06-26 MED ORDER — GENERIC EXTERNAL MEDICATION
Status: DC
Start: ? — End: 2019-06-26

## 2019-06-26 MED ORDER — MELATONIN 3 MG PO TABS
3.00 | ORAL_TABLET | ORAL | Status: DC
Start: ? — End: 2019-06-26

## 2019-06-26 MED ORDER — GUAIFENESIN 100 MG/5ML PO SYRP
200.00 | ORAL_SOLUTION | ORAL | Status: DC
Start: ? — End: 2019-06-26

## 2019-06-26 MED ORDER — TAMSULOSIN HCL 0.4 MG PO CAPS
0.40 | ORAL_CAPSULE | ORAL | Status: DC
Start: 2019-06-27 — End: 2019-06-26

## 2019-06-26 MED ORDER — ACETAMINOPHEN 325 MG PO TABS
650.00 | ORAL_TABLET | ORAL | Status: DC
Start: ? — End: 2019-06-26

## 2019-06-26 MED ORDER — GENERIC EXTERNAL MEDICATION
1.00 | Status: DC
Start: 2019-06-27 — End: 2019-06-26

## 2019-06-26 MED ORDER — VENLAFAXINE HCL ER 75 MG PO CP24
75.00 | ORAL_CAPSULE | ORAL | Status: DC
Start: 2019-06-27 — End: 2019-06-26

## 2019-06-26 MED ORDER — ASPIRIN 81 MG PO CHEW
81.00 | CHEWABLE_TABLET | ORAL | Status: DC
Start: 2019-06-27 — End: 2019-06-26

## 2019-06-26 MED ORDER — VENLAFAXINE HCL ER 150 MG PO CP24
150.00 | ORAL_CAPSULE | ORAL | Status: DC
Start: 2019-06-27 — End: 2019-06-26

## 2019-06-26 MED ORDER — ALUM & MAG HYDROXIDE-SIMETH 400-400-40 MG/5ML PO SUSP
30.00 | ORAL | Status: DC
Start: ? — End: 2019-06-26

## 2019-06-26 MED ORDER — ATORVASTATIN CALCIUM 80 MG PO TABS
80.00 | ORAL_TABLET | ORAL | Status: DC
Start: 2019-06-26 — End: 2019-06-26

## 2019-06-26 MED ORDER — APIXABAN 5 MG PO TABS
5.00 | ORAL_TABLET | ORAL | Status: DC
Start: 2019-06-26 — End: 2019-06-26

## 2019-06-26 NOTE — Telephone Encounter (Signed)
Patient wanted to know since his insurance is requiring him to go to [facility] if Dr. Bernardo Heater had a recommendation on who he should see at the [place] location?

## 2019-06-26 NOTE — Telephone Encounter (Signed)
Called patient to follow up on a after hours nurse call for worsening urinary symptoms after starting abx. Patient states after that call he got a fever over 101 and went to the ER at [place]. He has a stone and was stented due to obstruction and is currently admitted due to sepsis from the stone. He will not be able to follow up with care here due to his insurance changing and we are out of network he will follow up at [facility]

## 2019-06-26 NOTE — Telephone Encounter (Signed)
Pt states he was returning a call.  Pt was on park but hung up. Please advise. Thanks.

## 2019-06-26 NOTE — Telephone Encounter (Signed)
Referral was ordered. 

## 2019-07-04 ENCOUNTER — Ambulatory Visit: Payer: BLUE CROSS/BLUE SHIELD | Admitting: Urology

## 2019-07-10 ENCOUNTER — Other Ambulatory Visit (HOSPITAL_COMMUNITY)
Admission: RE | Admit: 2019-07-10 | Discharge: 2019-07-10 | Disposition: A | Discharge status: Home / Self Care | Payer: BLUE CROSS/BLUE SHIELD | Source: Other Acute Inpatient Hospital | Attending: Infectious Diseases | Admitting: Infectious Diseases

## 2019-07-10 DIAGNOSIS — A419 Sepsis, unspecified organism: Secondary | ICD-10-CM | POA: Diagnosis not present

## 2019-07-10 LAB — CBC WITH DIFFERENTIAL/PLATELET
Abs Immature Granulocytes: 0.01 10*3/uL (ref 0.00–0.07)
Basophils Absolute: 0.1 10*3/uL (ref 0.0–0.1)
Basophils Relative: 2 %
Eosinophils Absolute: 0 10*3/uL (ref 0.0–0.5)
Eosinophils Relative: 0 %
HCT: 45.3 % (ref 39.0–52.0)
Hemoglobin: 15.5 g/dL (ref 13.0–17.0)
Immature Granulocytes: 0 %
Lymphocytes Relative: 29 %
Lymphs Abs: 1.7 10*3/uL (ref 0.7–4.0)
MCH: 32 pg (ref 26.0–34.0)
MCHC: 34.2 g/dL (ref 30.0–36.0)
MCV: 93.6 fL (ref 80.0–100.0)
Monocytes Absolute: 0.8 10*3/uL (ref 0.1–1.0)
Monocytes Relative: 13 %
Neutro Abs: 3.2 10*3/uL (ref 1.7–7.7)
Neutrophils Relative %: 56 %
Platelets: 268 10*3/uL (ref 150–400)
RBC: 4.84 MIL/uL (ref 4.22–5.81)
RDW: 12.3 % (ref 11.5–15.5)
WBC: 5.8 10*3/uL (ref 4.0–10.5)
nRBC: 0 % (ref 0.0–0.2)

## 2019-07-10 LAB — SEDIMENTATION RATE: Sed Rate: 7 mm/hr (ref 0–16)

## 2019-07-10 LAB — COMPREHENSIVE METABOLIC PANEL
ALT: 30 U/L (ref 0–44)
AST: 34 U/L (ref 15–41)
Albumin: 4.1 g/dL (ref 3.5–5.0)
Alkaline Phosphatase: 79 U/L (ref 38–126)
Anion gap: 13 (ref 5–15)
BUN: 23 mg/dL — ABNORMAL HIGH (ref 6–20)
CO2: 24 mmol/L (ref 22–32)
Calcium: 9.5 mg/dL (ref 8.9–10.3)
Chloride: 103 mmol/L (ref 98–111)
Creatinine, Ser: 1.08 mg/dL (ref 0.61–1.24)
GFR calc Af Amer: >60 mL/min (ref >60)
GFR calc non Af Amer: >60 mL/min (ref >60)
Glucose, Bld: 95 mg/dL (ref 70–99)
Potassium: 4.4 mmol/L (ref 3.5–5.1)
Sodium: 140 mmol/L (ref 135–145)
Total Bilirubin: 0.8 mg/dL (ref 0.3–1.2)
Total Protein: 7.1 g/dL (ref 6.5–8.1)

## 2019-07-10 LAB — C-REACTIVE PROTEIN: CRP: <0.8 mg/dL (ref <1.0)

## 2019-07-11 LAB — VANCOMYCIN, TROUGH: Vancomycin Tr: 18 ug/mL (ref 15–20)

## 2019-07-25 ENCOUNTER — Other Ambulatory Visit: Payer: Self-pay | Admitting: Psychiatry

## 2019-08-04 ENCOUNTER — Telehealth: Payer: Self-pay | Admitting: Psychiatry

## 2019-08-04 NOTE — Telephone Encounter (Signed)
TC from Dr. Louanna Raw [facility]  Prolonged blood stream infection.  Was there 2 days for stent placement. Consider serotonin syndrom with linozild and levoquin. Wonder about reducing the venlafaxine. Not received venlafaxine while at the hospital.  Researched the degree of risk in using the combination of medications.  Linezolid is a week and reversible MAO inhibitor, therefore there is a theoretical risk of serotonin syndrome using venlafaxine or any other SSRI with Lunesta lid.  The risk appears relatively low in comparison to other MAO inhibitors and SSRIs but the risk is not 0.  The patient is likely to have SSRI with withdrawal if he abruptly discontinues venlafaxine which is known to cause significant withdrawal symptoms.  We will discussed with the patient with the signs and symptoms of serotonin syndrome are and we will attempt to reduce the dosage of venlafaxine as low as possible to keep withdrawal from being highly problematic but also to reduce his risk of serotonin syndrome.  Called patient.  To disc.the above.    His sx started with kidney infx then sepsis and was no IV Abx at home and had problems with the Abx and went into hospital to have stent placed and the new Abx to be started.  Included fever 101F now resolved.  No withdrawal effects at this time.  Disc SS of serotonin syndrome.  Drop Effexor to 75 mg but that still does not eliminate the risk but he'll have severe withdrawal sx if he abruptly stops the Effexor but that will be the plan if he has any serotonin syndrome sx.  Lynder Parents, MD, DFAPA

## 2019-09-12 ENCOUNTER — Other Ambulatory Visit: Payer: Self-pay | Admitting: Psychiatry

## 2019-09-12 DIAGNOSIS — F900 Attention-deficit hyperactivity disorder, predominantly inattentive type: Secondary | ICD-10-CM

## 2019-09-12 NOTE — Telephone Encounter (Signed)
Next appt. 12/14

## 2019-10-11 ENCOUNTER — Other Ambulatory Visit: Payer: Self-pay | Admitting: Psychiatry

## 2019-10-11 DIAGNOSIS — F900 Attention-deficit hyperactivity disorder, predominantly inattentive type: Secondary | ICD-10-CM

## 2019-10-11 NOTE — Telephone Encounter (Signed)
Has appt in Dec. Unsure last visit not seen in epic

## 2019-11-10 IMAGING — US US EXTREM LOW VENOUS*L*
1 series · 14 of 24 positions shown · non-contrast
Comparison: None.

CLINICAL DATA: Left calf pain for 1 week

EXAM:
LEFT LOWER EXTREMITY VENOUS DUPLEX ULTRASOUND
TECHNIQUE: Doppler venous assessment of the left lower extremity deep venous
system was performed, including characterization of spectral flow,
compressibility, and phasicity.

[Series 1: us extrem low venous*left* · 0.08mm/px · 14 of 38 slices shown]
[im 1/38]
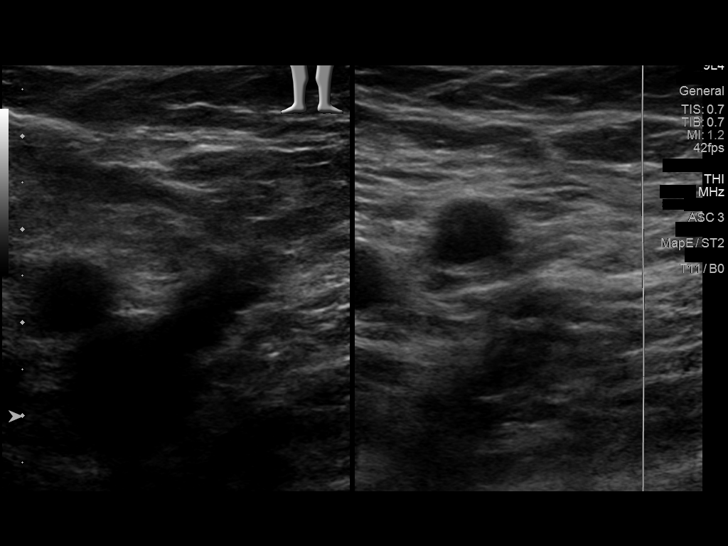
[im 4/38]
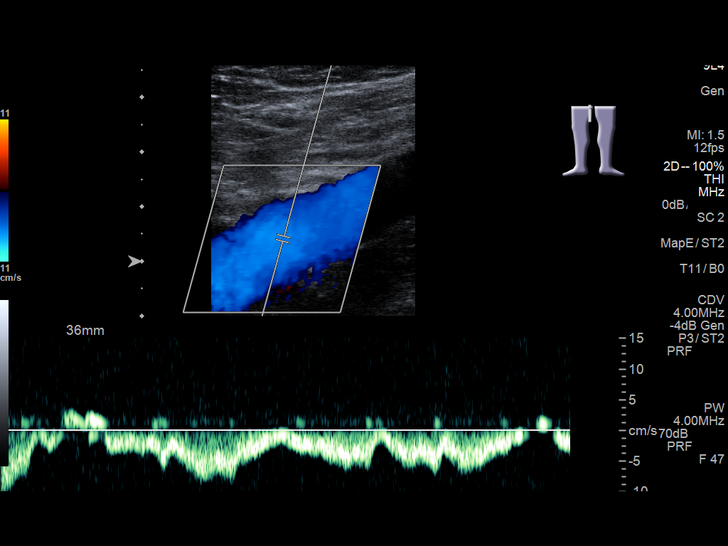
[im 7/38]
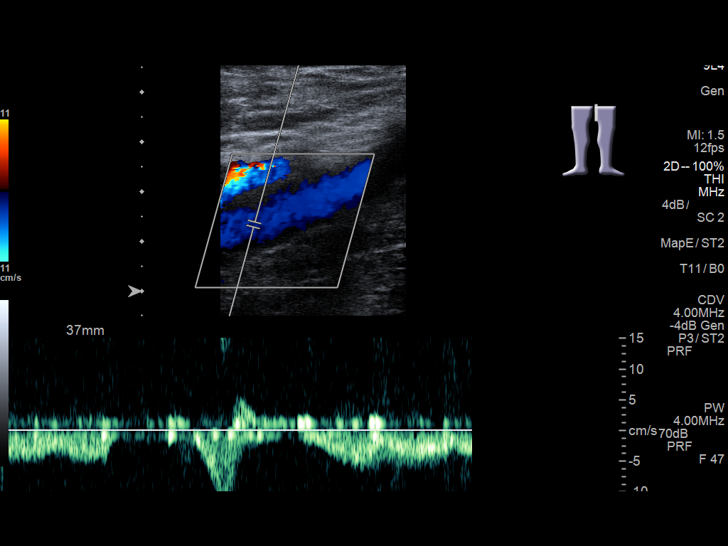
[im 10/38]
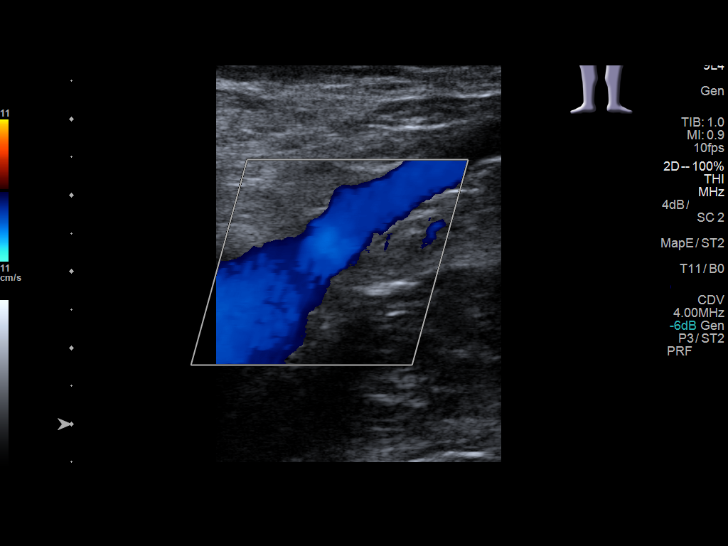
[im 12/38]
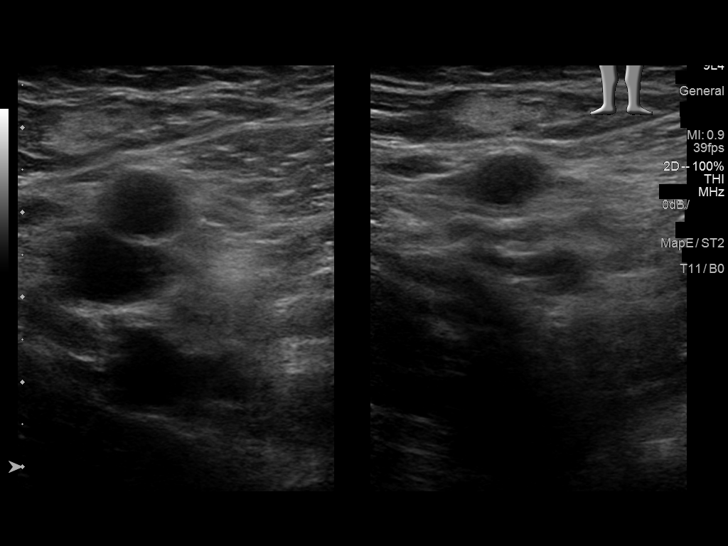
[im 15/38]
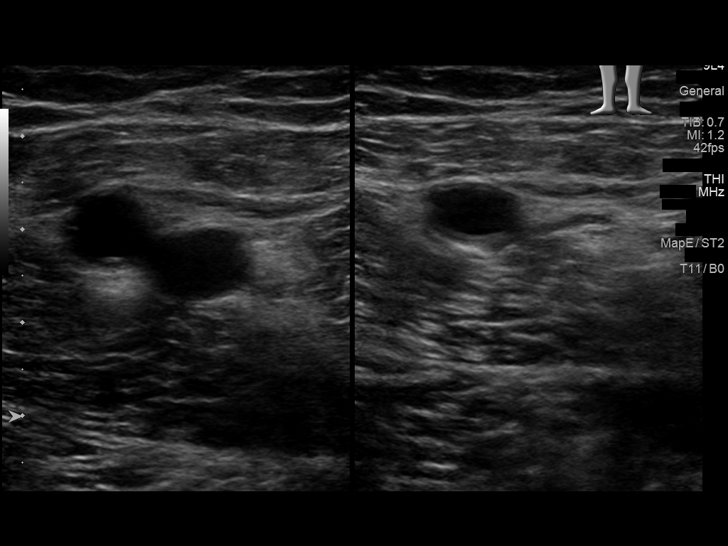
[im 18/38]
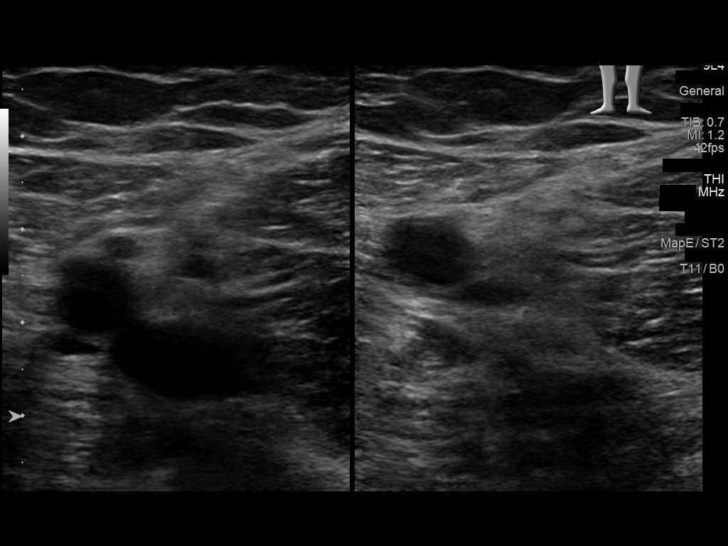
[im 20/38]
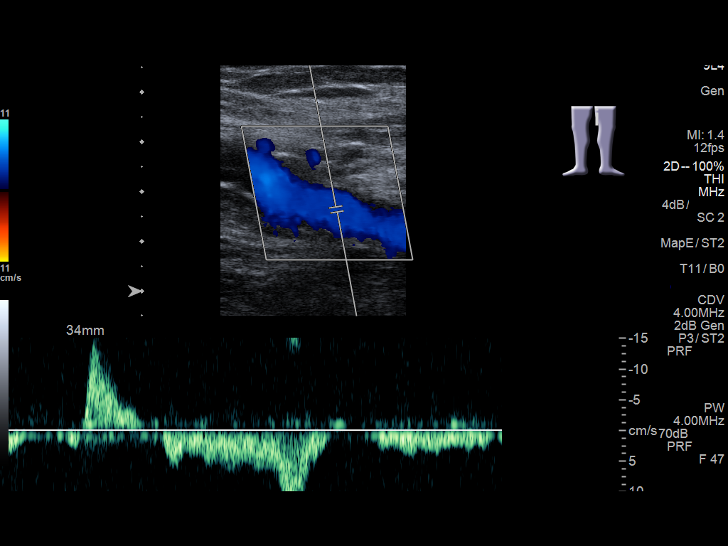
[im 23/38]
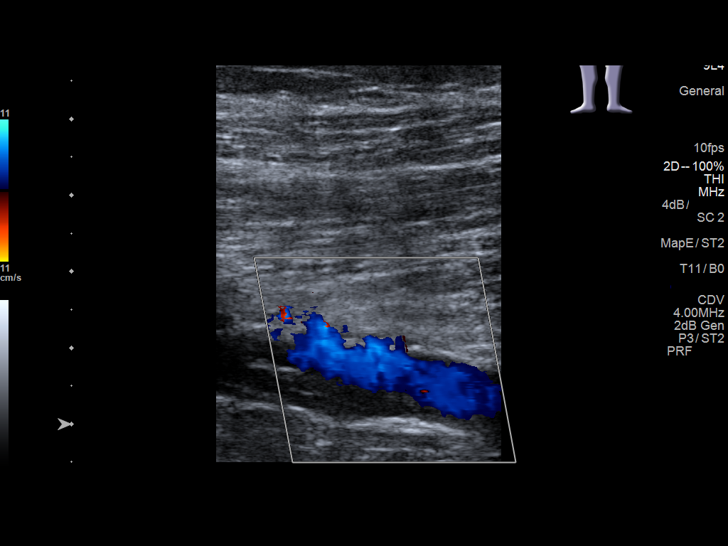
[im 26/38]
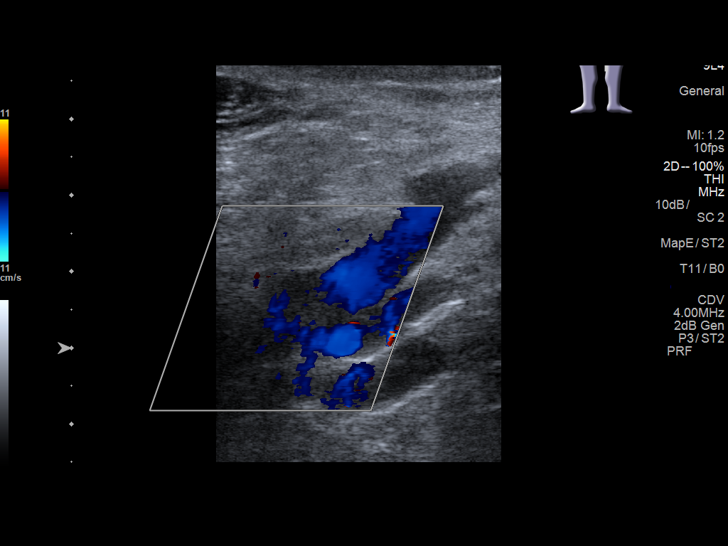
[im 29/38]
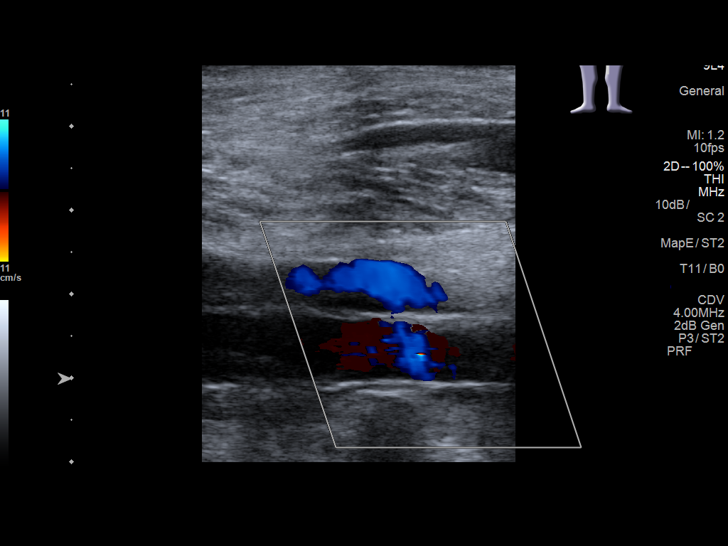
[im 31/38]
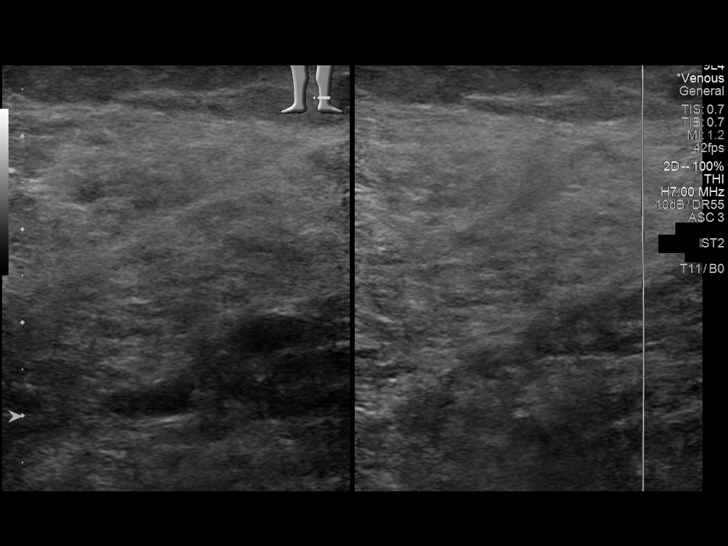
[im 34/38]
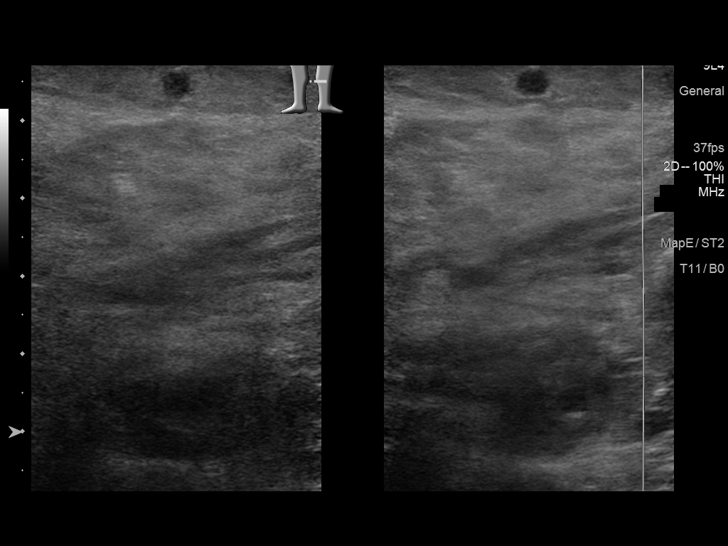
[im 38/38]
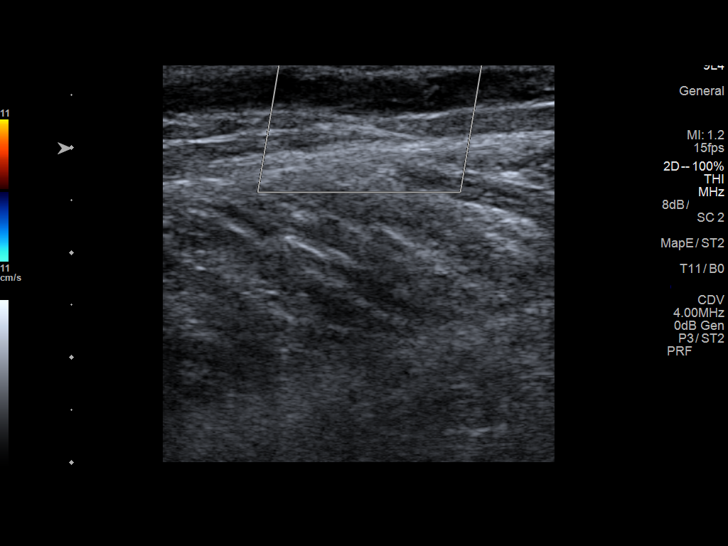

[14 of 24 positions shown; findings below may reference images not displayed]

FINDINGS: There is complete compressibility of the left common femoral vein,
proximal femoral vein, and mid femoral vein. The distal femoral vein
and popliteal vein are partially compressible. They contain
partially occlusive thrombus. The thrombus is somewhat ice O2
hyperechoic suggesting subacute age. There is also thrombus in the
greater saphenous vein within the calf.
IMPRESSION: The study is positive for partially occlusive DVT in the distal left
femoral vein and left popliteal vein. Subacute age is favored.

## 2019-11-11 ENCOUNTER — Other Ambulatory Visit: Payer: Self-pay | Admitting: Psychiatry

## 2019-11-11 DIAGNOSIS — F900 Attention-deficit hyperactivity disorder, predominantly inattentive type: Secondary | ICD-10-CM

## 2019-11-12 NOTE — Telephone Encounter (Signed)
Next apt 12/14 

## 2019-11-27 ENCOUNTER — Encounter: Payer: Self-pay | Admitting: Psychiatry

## 2019-11-27 ENCOUNTER — Ambulatory Visit (INDEPENDENT_AMBULATORY_CARE_PROVIDER_SITE_OTHER): Payer: BLUE CROSS/BLUE SHIELD | Admitting: Psychiatry

## 2019-11-27 ENCOUNTER — Other Ambulatory Visit: Payer: Self-pay

## 2019-11-27 VITALS — BP 143/91 | HR 71

## 2019-11-27 DIAGNOSIS — F4010 Social phobia, unspecified: Secondary | ICD-10-CM

## 2019-11-27 DIAGNOSIS — F900 Attention-deficit hyperactivity disorder, predominantly inattentive type: Secondary | ICD-10-CM

## 2019-11-27 DIAGNOSIS — F331 Major depressive disorder, recurrent, moderate: Secondary | ICD-10-CM | POA: Diagnosis not present

## 2019-11-27 DIAGNOSIS — F422 Mixed obsessional thoughts and acts: Secondary | ICD-10-CM

## 2019-11-27 MED ORDER — LISDEXAMFETAMINE DIMESYLATE 70 MG PO CAPS: 70.0000 mg | ORAL_CAPSULE | Freq: Every day | ORAL | 0 refills | Status: DC

## 2019-11-27 MED ORDER — VENLAFAXINE HCL ER 75 MG PO CP24: 75.0000 mg | ORAL_CAPSULE | Freq: Every morning | ORAL | 1 refills | Status: DC

## 2019-11-27 MED ORDER — VENLAFAXINE HCL ER 150 MG PO CP24: 150.0000 mg | ORAL_CAPSULE | Freq: Every morning | ORAL | 1 refills | Status: DC

## 2019-11-27 NOTE — Progress Notes (Signed)
ARMSTRONG CREASY [id number] 02-Mar-1961 58 y.o.  Subjective:   Patient ID:  Nicholas Bonilla is a 58 y.o. (DOB 1961/04/13) male.  Chief Complaint:  Chief Complaint  Patient presents with  . Follow-up    Medication Management  . Depression    Medication Management  . Stress    health MI    Anxiety Patient reports no chest pain, confusion, decreased concentration, nervous/anxious behavior, palpitations or suicidal ideas.    Depression        Associated symptoms include no decreased concentration and no suicidal ideas.  Past medical history includes anxiety.    Nicholas Bonilla presents to the office today for follow-up of depression anxiety, ADD and sleep problems.  Last seen June 2020.  No med changes were made.  Continues Vyvanse 70 mg, Effexor 225 mg daily.  Much better.  Sepsis July and kidney stone. Hx MI.  Better now.  More back to normal.  While hospitalized was on Effexor 150 but back 225 Effexor and it works well.  Overall well except some expected bouts of loneliness and depression.  Biggest outlet has been church and Covid interfered..  Work is better and better confidence.   Patient reports stable mood and denies depressed or irritable moods.  Patient denies any recent difficulty with anxiety.  Patient denies difficulty with sleep initiation or maintenance, sleep to bed 10:30 and start waking about 8 hour.  Intermittent cathing necessary.  Not napping.. Denies appetite disturbance.  Patient reports that energy and motivation have been good.  Patient denies any difficulty with concentration.  Patient denies any suicidal ideation.  Last year restarted Vyvanse and it's working very well but it's $216 month.  Pleased with it.  Adderall less effective.  Really needs the stimulant for attention and focus.  Has returned to work.  He had some leftover Vyvanse and added it and mood was much improved and clear-headed.  Past Psychiatric Medication Trials:  Adderall, modafinil,  Adzenys,Daytrana,  Vyvanse Venlafaxine 225,  Prozac, citalopram,   Review of Systems:  Review of Systems  Cardiovascular: Negative for chest pain and palpitations.  Musculoskeletal: Positive for arthralgias, back pain and neck stiffness.  Neurological: Negative for tremors and weakness.  Psychiatric/Behavioral: Positive for depression. Negative for agitation, behavioral problems, confusion, decreased concentration, dysphoric mood, hallucinations, self-injury, sleep disturbance and suicidal ideas. The patient is not nervous/anxious and is not hyperactive.   BP controlled  Medications: I have reviewed the patient's current medications.  Current Outpatient Medications  Medication Sig Dispense Refill  . acetaminophen (TYLENOL) 500 MG tablet Take 1,000 mg by mouth 3 (three) times daily.     Marland Kitchen apixaban (ELIQUIS) 5 MG TABS tablet Take 1 tablet (5 mg total) by mouth 2 (two) times daily. 60 tablet 5  . aspirin 81 MG chewable tablet Chew 81 mg by mouth daily.    Marland Kitchen atorvastatin (LIPITOR) 80 MG tablet Take 80 mg by mouth daily.    . balsalazide (COLAZAL) 750 MG capsule Take 2,250 mg by mouth 3 (three) times daily.     . Cholecalciferol (VITAMIN D3) 5000 units CAPS Take 5,000 Units by mouth daily.    . Coenzyme Q10 (CO Q-10) 100 MG CAPS Take 100 mg by mouth daily.    Marland Kitchen DHEA 25 MG CAPS Take by mouth.    Melene Muller ON 01/22/2020] lisdexamfetamine (VYVANSE) 70 MG capsule Take 1 capsule (70 mg total) by mouth daily. 30 capsule 0  . [START ON 12/25/2019] lisdexamfetamine (VYVANSE) 70 MG capsule Take  1 capsule (70 mg total) by mouth daily. 30 capsule 0  . lisdexamfetamine (VYVANSE) 70 MG capsule Take 1 capsule (70 mg total) by mouth daily. 30 capsule 0  . lisinopril (PRINIVIL,ZESTRIL) 2.5 MG tablet Take 5 mg by mouth daily.     . metoprolol succinate (TOPROL-XL) 100 MG 24 hr tablet Take 100 mg by mouth daily. Take with or immediately following a meal.    . Multiple Vitamins-Minerals (THERAGRAN-M PREMIER 50 PLUS)  TABS Take 1 tablet by mouth daily.    . polyethylene glycol (MIRALAX / GLYCOLAX) packet Take 17 g by mouth 2 (two) times daily.    Marland Kitchen Specialty Vitamins Products (PROSTATE PO) Take by mouth.    . tamsulosin (FLOMAX) 0.4 MG CAPS capsule Take 1 capsule (0.4 mg total) by mouth daily. 30 capsule 0  . venlafaxine XR (EFFEXOR-XR) 150 MG 24 hr capsule Take 1 capsule (150 mg total) by mouth every morning. 90 capsule 1  . venlafaxine XR (EFFEXOR-XR) 75 MG 24 hr capsule Take 1 capsule (75 mg total) by mouth every morning. 90 capsule 1   No current facility-administered medications for this visit.    Medication Side Effects: None  Allergies:  Allergies  Allergen Reactions  . Codeine Rash and Other (See Comments)    Only in high doses Other reaction(s): Headache   . Latex Rash  . Naproxen Sodium Other (See Comments)    REACTION: colitis flare  . Promethazine Hcl Other (See Comments)    REACTION: mental status changes, memory loss    Past Medical History:  Diagnosis Date  . ADD (attention deficit disorder with hyperactivity)   . Arthritis   . History of blood clots    Left Leg  . History of kidney stones   . Hypertension   . OCD (obsessive compulsive disorder)   . Ulcerative colitis     Family History  Problem Relation Age of Onset  . Heart attack Father 13  . Hypertension Father   . Rheumatic fever Father   . Sudden death Father   . Cancer Sister        Larynx-smoker  . Hypertension Sister   . Heart attack Maternal Uncle        X2; > 47 yo  . Heart attack Paternal Grandfather        > 55  . Dementia Mother   . Diabetes Neg Hx   . Hyperlipidemia Neg Hx     Social History   Socioeconomic History  . Marital status: Married    Spouse name: [name]  . Number of children: 0  . Years of education: 12+  . Highest education level: Some college, no degree  Occupational History  . Not on file  Tobacco Use  . Smoking status: Former Smoker    Types: Cigarettes    Quit date:  12/14/1984    Years since quitting: 34.9  . Smokeless tobacco: Never Used  Substance and Sexual Activity  . Alcohol use: Yes    Comment:  1 wine or  beer/ day or less  . Drug use: No  . Sexual activity: Not on file  Other Topics Concern  . Not on file  Social History Narrative   From [facility]   Married with no children   Occasional drinks   Former smoker quit 1986   Full Code   Social Determinants of Health   Financial Resource Strain:   . Difficulty of Paying Living Expenses: Not on file  Food Insecurity:   .  Worried About Charity fundraiser in the Last Year: Not on file  . Ran Out of Food in the Last Year: Not on file  Transportation Needs:   . Lack of Transportation (Medical): Not on file  . Lack of Transportation (Non-Medical): Not on file  Physical Activity:   . Days of Exercise per Week: Not on file  . Minutes of Exercise per Session: Not on file  Stress:   . Feeling of Stress : Not on file  Social Connections:   . Frequency of Communication with Friends and Family: Not on file  . Frequency of Social Gatherings with Friends and Family: Not on file  . Attends Religious Services: Not on file  . Active Member of Clubs or Organizations: Not on file  . Attends Archivist Meetings: Not on file  . Marital Status: Not on file  Intimate Partner Violence:   . Fear of Current or Ex-Partner: Not on file  . Emotionally Abused: Not on file  . Physically Abused: Not on file  . Sexually Abused: Not on file    Past Medical History, Surgical history, Social history, and Family history were reviewed and updated as appropriate.   Please see review of systems for further details on the patient's review from today.   Objective:   Physical Exam:  BP (!) 143/91   Pulse 71   Physical Exam Constitutional:      General: He is not in acute distress.    Appearance: He is well-developed.  Musculoskeletal:        General: No deformity.  Neurological:     Mental  Status: He is alert and oriented to person, place, and time.     Motor: No tremor.     Coordination: Coordination normal.     Gait: Gait normal.  Psychiatric:        Attention and Perception: Attention and perception normal. He is attentive.        Mood and Affect: Mood is not anxious or depressed. Affect is not labile, blunt, angry or inappropriate.        Speech: Speech normal.        Behavior: Behavior normal.        Thought Content: Thought content normal. Thought content does not include homicidal or suicidal ideation. Thought content does not include homicidal or suicidal plan.        Cognition and Memory: Cognition normal.        Judgment: Judgment normal.     Comments: Insight intact. No auditory or visual hallucinations. No delusions.      Lab Review:     Component Value Date/Time   NA 140 07/10/2019 0830   NA 143 08/07/2014 1450   K 4.4 07/10/2019 0830   K 4.0 08/07/2014 1450   CL 103 07/10/2019 0830   CL 107 08/07/2014 1450   CO2 24 07/10/2019 0830   CO2 27 08/07/2014 1450   GLUCOSE 95 07/10/2019 0830   GLUCOSE 102 (H) 08/07/2014 1450   BUN 23 (H) 07/10/2019 0830   BUN 21 (H) 08/07/2014 1450   CREATININE 1.08 07/10/2019 0830   CREATININE 1.20 08/07/2014 1450   CALCIUM 9.5 07/10/2019 0830   CALCIUM 9.0 08/07/2014 1450   PROT 7.1 07/10/2019 0830   PROT 7.0 08/07/2014 1450   ALBUMIN 4.1 07/10/2019 0830   ALBUMIN 3.8 08/07/2014 1450   AST 34 07/10/2019 0830   AST 29 08/07/2014 1450   ALT 30 07/10/2019 0830   ALT 33 08/07/2014  1450   ALKPHOS 79 07/10/2019 0830   ALKPHOS 58 08/07/2014 1450   BILITOT 0.8 07/10/2019 0830   BILITOT 0.4 08/07/2014 1450   GFRNONAA >60 07/10/2019 0830   GFRNONAA >60 08/07/2014 1450   GFRAA >60 07/10/2019 0830   GFRAA >60 08/07/2014 1450       Component Value Date/Time   WBC 5.8 07/10/2019 0830   RBC 4.84 07/10/2019 0830   HGB 15.5 07/10/2019 0830   HGB 16.6 08/07/2014 1450   HCT 45.3 07/10/2019 0830   HCT 48.3 08/07/2014  1450   PLT 268 07/10/2019 0830   PLT 178 08/07/2014 1450   MCV 93.6 07/10/2019 0830   MCV 91 08/07/2014 1450   MCH 32.0 07/10/2019 0830   MCHC 34.2 07/10/2019 0830   RDW 12.3 07/10/2019 0830   RDW 12.5 08/07/2014 1450   LYMPHSABS 1.7 07/10/2019 0830   LYMPHSABS 1.8 08/07/2014 1450   MONOABS 0.8 07/10/2019 0830   MONOABS 0.5 08/07/2014 1450   EOSABS 0.0 07/10/2019 0830   EOSABS 0.1 08/07/2014 1450   BASOSABS 0.1 07/10/2019 0830   BASOSABS 0.1 08/07/2014 1450    No results found for: POCLITH, LITHIUM   No results found for: PHENYTOIN, PHENOBARB, VALPROATE, CBMZ  Obstructive sleep apnea results discussed in detail he has very mild sleep apnea with an AHI of 8.5.  He could consider CPAP but is not likely to make a huge difference.  We will reserve this option for later. .res Assessment: Plan:    Nicholas MaduroRobert was seen today for follow-up, depression and stress.  Diagnoses and all orders for this visit:  Attention deficit hyperactivity disorder (ADHD), predominantly inattentive type -     lisdexamfetamine (VYVANSE) 70 MG capsule; Take 1 capsule (70 mg total) by mouth daily. -     lisdexamfetamine (VYVANSE) 70 MG capsule; Take 1 capsule (70 mg total) by mouth daily. -     lisdexamfetamine (VYVANSE) 70 MG capsule; Take 1 capsule (70 mg total) by mouth daily.  Major depressive disorder, recurrent episode, moderate (HCC) -     venlafaxine XR (EFFEXOR-XR) 150 MG 24 hr capsule; Take 1 capsule (150 mg total) by mouth every morning. -     venlafaxine XR (EFFEXOR-XR) 75 MG 24 hr capsule; Take 1 capsule (75 mg total) by mouth every morning.  Social anxiety disorder -     venlafaxine XR (EFFEXOR-XR) 150 MG 24 hr capsule; Take 1 capsule (150 mg total) by mouth every morning. -     venlafaxine XR (EFFEXOR-XR) 75 MG 24 hr capsule; Take 1 capsule (75 mg total) by mouth every morning.  Mixed obsessional thoughts and acts -     venlafaxine XR (EFFEXOR-XR) 150 MG 24 hr capsule; Take 1 capsule (150 mg  total) by mouth every morning. -     venlafaxine XR (EFFEXOR-XR) 75 MG 24 hr capsule; Take 1 capsule (75 mg total) by mouth every morning.    Nicholas Bonilla has had controlled depression and poor attention and focus.  Reports the Vyvanse had a much better mood and conc effect with the Vyvanse even over the modafanil.Has tried Adzenys and Adderall also.    He wants to continue the Vyvanse 70mg .  Disc cardiac effects.  Monitor BP on the med and call us back.  Both his mood and focus are better on the Vyvanse.  No med changes  Obstructive sleep apnea results discussed in detail he has very mild sleep apnea with an AHI of 8.5.  He could consider CPAP but is not  likely to make a huge difference.  We will reserve this option for later.  I would recommend he share his sleep study results with his cardiologist to see if the cardiologist has any further input.  Cardiologist Dr. Corena Herter also recommended the sleep study on June 24, 2018.  Educated him about the dangers of untreated sleep apnea and and especially in association with his underlying heart disease  He agrees to this plan  FU 6 mos.  Meredith Staggers, MD, DFAPA   Please see After Visit Summary for patient specific instructions.  No future appointments.  No orders of the defined types were placed in this encounter.     -------------------------------

## 2020-04-09 ENCOUNTER — Other Ambulatory Visit: Payer: Self-pay | Admitting: Psychiatry

## 2020-04-09 DIAGNOSIS — F900 Attention-deficit hyperactivity disorder, predominantly inattentive type: Secondary | ICD-10-CM

## 2020-04-09 NOTE — Telephone Encounter (Signed)
Next apt 06/14

## 2020-04-11 ENCOUNTER — Other Ambulatory Visit: Payer: Self-pay | Admitting: Psychiatry

## 2020-04-11 DIAGNOSIS — F900 Attention-deficit hyperactivity disorder, predominantly inattentive type: Secondary | ICD-10-CM

## 2020-04-11 NOTE — Telephone Encounter (Signed)
Already sent.

## 2020-04-25 ENCOUNTER — Telehealth: Payer: Self-pay | Admitting: Psychiatry

## 2020-04-25 NOTE — Telephone Encounter (Signed)
Okay to continue Effexor XR 300 mg daily.  He can have a prescription for Effexor XR 150 mg capsules 2 daily #61 refill.

## 2020-04-25 NOTE — Telephone Encounter (Signed)
Pt left a voicemail stating he would like to speak with someone concerning increasing his Effexor dosage.

## 2020-04-26 ENCOUNTER — Other Ambulatory Visit: Payer: Self-pay

## 2020-04-26 DIAGNOSIS — F4010 Social phobia, unspecified: Secondary | ICD-10-CM

## 2020-04-26 DIAGNOSIS — F331 Major depressive disorder, recurrent, moderate: Secondary | ICD-10-CM

## 2020-04-26 DIAGNOSIS — F422 Mixed obsessional thoughts and acts: Secondary | ICD-10-CM

## 2020-04-26 MED ORDER — VENLAFAXINE HCL ER 150 MG PO CP24: 300.0000 mg | ORAL_CAPSULE | Freq: Every morning | ORAL | 1 refills | Status: DC

## 2020-04-26 NOTE — Telephone Encounter (Signed)
Rx sent 

## 2020-05-09 ENCOUNTER — Other Ambulatory Visit: Payer: Self-pay | Admitting: Psychiatry

## 2020-05-09 DIAGNOSIS — F900 Attention-deficit hyperactivity disorder, predominantly inattentive type: Secondary | ICD-10-CM

## 2020-05-09 NOTE — Telephone Encounter (Signed)
Has apt 06/14

## 2020-05-14 NOTE — Telephone Encounter (Signed)
Please send

## 2020-05-14 NOTE — Telephone Encounter (Signed)
Patient called again and said that he needs his refill on his vyvanse. The pharmacy sent a request on Thursday and he is out

## 2020-05-15 NOTE — Telephone Encounter (Signed)
Please resend apt 06/14

## 2020-05-27 ENCOUNTER — Ambulatory Visit (INDEPENDENT_AMBULATORY_CARE_PROVIDER_SITE_OTHER): Payer: BLUE CROSS/BLUE SHIELD | Admitting: Psychiatry

## 2020-05-27 ENCOUNTER — Other Ambulatory Visit: Payer: Self-pay

## 2020-05-27 ENCOUNTER — Encounter: Payer: Self-pay | Admitting: Psychiatry

## 2020-05-27 DIAGNOSIS — F331 Major depressive disorder, recurrent, moderate: Secondary | ICD-10-CM | POA: Diagnosis not present

## 2020-05-27 DIAGNOSIS — F422 Mixed obsessional thoughts and acts: Secondary | ICD-10-CM | POA: Diagnosis not present

## 2020-05-27 DIAGNOSIS — F4010 Social phobia, unspecified: Secondary | ICD-10-CM | POA: Diagnosis not present

## 2020-05-27 DIAGNOSIS — F900 Attention-deficit hyperactivity disorder, predominantly inattentive type: Secondary | ICD-10-CM

## 2020-05-27 DIAGNOSIS — G4733 Obstructive sleep apnea (adult) (pediatric): Secondary | ICD-10-CM

## 2020-05-27 MED ORDER — LISDEXAMFETAMINE DIMESYLATE 70 MG PO CAPS: 70.0000 mg | ORAL_CAPSULE | Freq: Every day | ORAL | 0 refills | Status: DC

## 2020-05-27 MED ORDER — VENLAFAXINE HCL ER 150 MG PO CP24: 300.0000 mg | ORAL_CAPSULE | Freq: Every morning | ORAL | 1 refills | Status: DC

## 2020-05-27 NOTE — Progress Notes (Signed)
Nicholas Bonilla [id number] 02-14-1961 59 y.o.  Subjective:   Patient ID:  Nicholas Bonilla is a 59 y.o. (DOB 10-26-1961) male.  Chief Complaint:  Chief Complaint  Patient presents with  . Follow-up  . Depression  . Anxiety    Depression        Associated symptoms include fatigue.  Associated symptoms include no decreased concentration and no suicidal ideas.  Past medical history includes anxiety.   Anxiety Patient reports no chest pain, confusion, decreased concentration, nervous/anxious behavior, palpitations or suicidal ideas.     Nicholas Bonilla presents to the office today for follow-up of depression anxiety, ADD and sleep problems.  seen June 2020.  No med changes were made.  Continues Vyvanse 70 mg, Effexor 225 mg daily.  December 2020 appointment the following was noted: Much better.  Sepsis July and kidney stone. Hx MI.  Better now.  More back to normal.  While hospitalized was on Effexor 150 but back 225 Effexor and it works well. Overall well except some expected bouts of loneliness and depression.  Biggest outlet has been church and Covid interfered..  Work is better and better confidence.   Patient reports stable mood and denies depressed or irritable moods.  Patient denies any recent difficulty with anxiety.  Patient denies difficulty with sleep initiation or maintenance, sleep to bed 10:30 and start waking about 8 hour.  Intermittent cathing necessary.  Not napping.. Denies appetite disturbance.  Patient reports that energy and motivation have been good.  Patient denies any difficulty with concentration.  Patient denies any suicidal ideation. Last year restarted Vyvanse and it's working very well but it's $216 month.  Pleased with it.  Adderall less effective. Really needs the stimulant for attention and focus.  Has returned to work.  Vyvanse added it and mood was much improved and clear-headed. No meds were changed  04/25/20 TC increase Effexor to 300 mg  daily.  05/27/2020 appointment with the following noted: Increase Effexor helped a good deal and glad he did so. Tolerating both meds fine.  Except brief period of sleepiness in the morning for a few mins and needs to lie down.  Not always.  Is also a sense of cloudiness needing a short nap. Patient reports stable mood and denies depressed or irritable moods.  Patient denies any recent difficulty with anxiety.  Patient denies difficulty with sleep initiation or maintenance. Denies appetite disturbance.  Patient reports motivation have been good.  Energy affected by MI and beta blockers.  Patient denies any difficulty with concentration.  Patient denies any suicidal ideation.   Past Psychiatric Medication Trials:  Adderall, modafinil, Adzenys,Daytrana,  Vyvanse Venlafaxine 300,  Prozac, citalopram,   Review of Systems:  Review of Systems  Constitutional: Positive for fatigue.  Cardiovascular: Negative for chest pain and palpitations.  Musculoskeletal: Positive for arthralgias, back pain and neck stiffness.  Neurological: Negative for tremors and weakness.  Psychiatric/Behavioral: Positive for depression. Negative for agitation, behavioral problems, confusion, decreased concentration, dysphoric mood, hallucinations, self-injury, sleep disturbance and suicidal ideas. The patient is not nervous/anxious and is not hyperactive.   BP controlled  Medications: I have reviewed the patient's current medications.  Current Outpatient Medications  Medication Sig Dispense Refill  . acetaminophen (TYLENOL) 500 MG tablet Take 1,000 mg by mouth 3 (three) times daily.     Marland Kitchen apixaban (ELIQUIS) 5 MG TABS tablet Take 1 tablet (5 mg total) by mouth 2 (two) times daily. 60 tablet 5  . aspirin 81 MG chewable tablet  Chew 81 mg by mouth daily.    Marland Kitchen atorvastatin (LIPITOR) 80 MG tablet Take 80 mg by mouth daily.    . balsalazide (COLAZAL) 750 MG capsule Take 2,250 mg by mouth 3 (three) times daily.     .  Cholecalciferol (VITAMIN D3) 5000 units CAPS Take 5,000 Units by mouth daily.    . Coenzyme Q10 (CO Q-10) 100 MG CAPS Take 100 mg by mouth daily.    Marland Kitchen DHEA 25 MG CAPS Take by mouth.    Derrill Memo ON 07/22/2020] lisdexamfetamine (VYVANSE) 70 MG capsule Take 1 capsule (70 mg total) by mouth daily. 30 capsule 0  . [START ON 06/24/2020] lisdexamfetamine (VYVANSE) 70 MG capsule Take 1 capsule (70 mg total) by mouth daily. 30 capsule 0  . lisdexamfetamine (VYVANSE) 70 MG capsule Take 1 capsule (70 mg total) by mouth daily. 30 capsule 0  . lisinopril (PRINIVIL,ZESTRIL) 2.5 MG tablet Take 5 mg by mouth daily.     . metoprolol succinate (TOPROL-XL) 100 MG 24 hr tablet Take 100 mg by mouth daily. Take with or immediately following a meal.    . Multiple Vitamins-Minerals (THERAGRAN-M PREMIER 50 PLUS) TABS Take 1 tablet by mouth daily.    . polyethylene glycol (MIRALAX / GLYCOLAX) packet Take 17 g by mouth 2 (two) times daily.    Marland Kitchen Specialty Vitamins Products (PROSTATE PO) Take by mouth.    . tamsulosin (FLOMAX) 0.4 MG CAPS capsule Take 1 capsule (0.4 mg total) by mouth daily. 30 capsule 0  . venlafaxine XR (EFFEXOR-XR) 150 MG 24 hr capsule Take 2 capsules (300 mg total) by mouth every morning. 180 capsule 1   No current facility-administered medications for this visit.    Medication Side Effects: None  Allergies:  Allergies  Allergen Reactions  . Codeine Rash and Other (See Comments)    Only in high doses Other reaction(s): Headache   . Latex Rash  . Naproxen Sodium Other (See Comments)    REACTION: colitis flare  . Promethazine Hcl Other (See Comments)    REACTION: mental status changes, memory loss    Past Medical History:  Diagnosis Date  . ADD (attention deficit disorder with hyperactivity)   . Arthritis   . History of blood clots    Left Leg  . History of kidney stones   . Hypertension   . OCD (obsessive compulsive disorder)   . Ulcerative colitis     Family History  Problem  Relation Age of Onset  . Heart attack Father 66  . Hypertension Father   . Rheumatic fever Father   . Sudden death Father   . Cancer Sister        Larynx-smoker  . Hypertension Sister   . Heart attack Maternal Uncle        X2; > 60 yo  . Heart attack Paternal Grandfather        > 55  . Dementia Mother   . Diabetes Neg Hx   . Hyperlipidemia Neg Hx     Social History   Socioeconomic History  . Marital status: Married    Spouse name: [name]  . Number of children: 0  . Years of education: 12+  . Highest education level: Some college, no degree  Occupational History  . Not on file  Tobacco Use  . Smoking status: Former Smoker    Types: Cigarettes    Quit date: 12/14/1984    Years since quitting: 35.4  . Smokeless tobacco: Never Used  Vaping Use  .  Vaping Use: Never used  Substance and Sexual Activity  . Alcohol use: Yes    Comment:  1 wine or  beer/ day or less  . Drug use: No  . Sexual activity: Not on file  Other Topics Concern  . Not on file  Social History Narrative   [place]   Married with no children   Occasional drinks   Former smoker quit 1986   Full Code   Social Determinants of Health   Financial Resource Strain:   . Difficulty of Paying Living Expenses:   Food Insecurity:   . Worried About Programme researcher, broadcasting/film/videounning Out of Food in the Last Year:   . Baristaan Out of Food in the Last Year:   Transportation Needs:   . Freight forwarderLack of Transportation (Medical):   Marland Kitchen. Lack of Transportation (Non-Medical):   Physical Activity:   . Days of Exercise per Week:   . Minutes of Exercise per Session:   Stress:   . Feeling of Stress :   Social Connections:   . Frequency of Communication with Friends and Family:   . Frequency of Social Gatherings with Friends and Family:   . Attends Religious Services:   . Active Member of Clubs or Organizations:   . Attends BankerClub or Organization Meetings:   Marland Kitchen. Marital Status:   Intimate Partner Violence:   . Fear of Current or Ex-Partner:   .  Emotionally Abused:   Marland Kitchen. Physically Abused:   . Sexually Abused:     Past Medical History, Surgical history, Social history, and Family history were reviewed and updated as appropriate.   Please see review of systems for further details on the patient's review from today.   Objective:   Physical Exam:  There were no vitals taken for this visit.  Physical Exam Constitutional:      General: He is not in acute distress.    Appearance: He is well-developed.  Musculoskeletal:        General: No deformity.  Neurological:     Mental Status: He is alert and oriented to person, place, and time.     Motor: No tremor.     Coordination: Coordination normal.     Gait: Gait normal.  Psychiatric:        Attention and Perception: Attention and perception normal. He is attentive.        Mood and Affect: Mood is not anxious or depressed. Affect is not labile, blunt, angry or inappropriate.        Speech: Speech normal.        Behavior: Behavior normal.        Thought Content: Thought content normal. Thought content does not include homicidal or suicidal ideation. Thought content does not include homicidal or suicidal plan.        Cognition and Memory: Cognition normal.        Judgment: Judgment normal.     Comments: Insight intact. No auditory or visual hallucinations. No delusions.  Less depressed after increase Effexor to 300     Lab Review:     Component Value Date/Time   NA 140 07/10/2019 0830   NA 143 08/07/2014 1450   K 4.4 07/10/2019 0830   K 4.0 08/07/2014 1450   CL 103 07/10/2019 0830   CL 107 08/07/2014 1450   CO2 24 07/10/2019 0830   CO2 27 08/07/2014 1450   GLUCOSE 95 07/10/2019 0830   GLUCOSE 102 (H) 08/07/2014 1450   BUN 23 (H) 07/10/2019 0830  BUN 21 (H) 08/07/2014 1450   CREATININE 1.08 07/10/2019 0830   CREATININE 1.20 08/07/2014 1450   CALCIUM 9.5 07/10/2019 0830   CALCIUM 9.0 08/07/2014 1450   PROT 7.1 07/10/2019 0830   PROT 7.0 08/07/2014 1450   ALBUMIN  4.1 07/10/2019 0830   ALBUMIN 3.8 08/07/2014 1450   AST 34 07/10/2019 0830   AST 29 08/07/2014 1450   ALT 30 07/10/2019 0830   ALT 33 08/07/2014 1450   ALKPHOS 79 07/10/2019 0830   ALKPHOS 58 08/07/2014 1450   BILITOT 0.8 07/10/2019 0830   BILITOT 0.4 08/07/2014 1450   GFRNONAA >60 07/10/2019 0830   GFRNONAA >60 08/07/2014 1450   GFRAA >60 07/10/2019 0830   GFRAA >60 08/07/2014 1450       Component Value Date/Time   WBC 5.8 07/10/2019 0830   RBC 4.84 07/10/2019 0830   HGB 15.5 07/10/2019 0830   HGB 16.6 08/07/2014 1450   HCT 45.3 07/10/2019 0830   HCT 48.3 08/07/2014 1450   PLT 268 07/10/2019 0830   PLT 178 08/07/2014 1450   MCV 93.6 07/10/2019 0830   MCV 91 08/07/2014 1450   MCH 32.0 07/10/2019 0830   MCHC 34.2 07/10/2019 0830   RDW 12.3 07/10/2019 0830   RDW 12.5 08/07/2014 1450   LYMPHSABS 1.7 07/10/2019 0830   LYMPHSABS 1.8 08/07/2014 1450   MONOABS 0.8 07/10/2019 0830   MONOABS 0.5 08/07/2014 1450   EOSABS 0.0 07/10/2019 0830   EOSABS 0.1 08/07/2014 1450   BASOSABS 0.1 07/10/2019 0830   BASOSABS 0.1 08/07/2014 1450    No results found for: POCLITH, LITHIUM   No results found for: PHENYTOIN, PHENOBARB, VALPROATE, CBMZ  Obstructive sleep apnea results discussed in detail he has very mild sleep apnea with an AHI of 8.5.  He could consider CPAP but is not likely to make a huge difference.  We will reserve this option for later. .res Assessment: Plan:    Nicholas Bonilla was seen today for follow-up, depression and anxiety.  Diagnoses and all orders for this visit:  Major depressive disorder, recurrent episode, moderate (HCC) -     venlafaxine XR (EFFEXOR-XR) 150 MG 24 hr capsule; Take 2 capsules (300 mg total) by mouth every morning.  Attention deficit hyperactivity disorder (ADHD), predominantly inattentive type -     lisdexamfetamine (VYVANSE) 70 MG capsule; Take 1 capsule (70 mg total) by mouth daily. -     lisdexamfetamine (VYVANSE) 70 MG capsule; Take 1 capsule  (70 mg total) by mouth daily. -     lisdexamfetamine (VYVANSE) 70 MG capsule; Take 1 capsule (70 mg total) by mouth daily.  Social anxiety disorder -     venlafaxine XR (EFFEXOR-XR) 150 MG 24 hr capsule; Take 2 capsules (300 mg total) by mouth every morning.  Mixed obsessional thoughts and acts -     venlafaxine XR (EFFEXOR-XR) 150 MG 24 hr capsule; Take 2 capsules (300 mg total) by mouth every morning.  Mild obstructive sleep apnea    [name] has had controlled depression and poor attention and focus.  Reports the Vyvanse had a much better mood and conc effect with the Vyvanse even over the modafanil.Has tried Adzenys and Adderall also.    He wants to continue the Vyvanse 70mg .  Disc cardiac effects.  Monitor BP on the med and call back.  Both his mood and focus are better on the Vyvanse.  No med changes Continue Effexor XR 300 and Vyvanse 70 Disc interactions with other supplements meds that might  increase NE then he may have trouble tolerating.  Obstructive sleep apnea results discussed in detail he has very mild sleep apnea with an AHI of 8.5.  He could consider CPAP but is not likely to make a huge difference.  We will reserve this option for later.  I would recommend he share his sleep study results with his cardiologist to see if the cardiologist has any further input.  Cardiologist Dr. Corena Herter also recommended the sleep study on June 24, 2018.  Educated him about the dangers of untreated sleep apnea and and especially in association with his underlying heart disease  He agrees to this plan  FU 6 mos.  Meredith Staggers, MD, DFAPA   Please see After Visit Summary for patient specific instructions.  No future appointments.  No orders of the defined types were placed in this encounter.     -------------------------------

## 2020-09-07 ENCOUNTER — Other Ambulatory Visit: Payer: Self-pay | Admitting: Psychiatry

## 2020-09-07 DIAGNOSIS — F900 Attention-deficit hyperactivity disorder, predominantly inattentive type: Secondary | ICD-10-CM

## 2020-09-10 NOTE — Telephone Encounter (Signed)
Next apt 11/2020

## 2020-10-09 ENCOUNTER — Other Ambulatory Visit: Payer: Self-pay | Admitting: Psychiatry

## 2020-10-09 DIAGNOSIS — F900 Attention-deficit hyperactivity disorder, predominantly inattentive type: Secondary | ICD-10-CM

## 2020-10-09 NOTE — Telephone Encounter (Signed)
Next apt 12/14 

## 2020-11-01 ENCOUNTER — Other Ambulatory Visit: Payer: Self-pay | Admitting: Psychiatry

## 2020-11-01 DIAGNOSIS — F900 Attention-deficit hyperactivity disorder, predominantly inattentive type: Secondary | ICD-10-CM

## 2020-11-01 NOTE — Telephone Encounter (Signed)
Next apt 12/14 

## 2020-11-19 ENCOUNTER — Other Ambulatory Visit: Payer: Self-pay | Admitting: Psychiatry

## 2020-11-19 DIAGNOSIS — F4010 Social phobia, unspecified: Secondary | ICD-10-CM

## 2020-11-19 DIAGNOSIS — F331 Major depressive disorder, recurrent, moderate: Secondary | ICD-10-CM

## 2020-11-19 DIAGNOSIS — F422 Mixed obsessional thoughts and acts: Secondary | ICD-10-CM

## 2020-11-26 ENCOUNTER — Other Ambulatory Visit: Payer: Self-pay

## 2020-11-26 ENCOUNTER — Ambulatory Visit (INDEPENDENT_AMBULATORY_CARE_PROVIDER_SITE_OTHER): Payer: BLUE CROSS/BLUE SHIELD | Admitting: Psychiatry

## 2020-11-26 ENCOUNTER — Encounter: Payer: Self-pay | Admitting: Psychiatry

## 2020-11-26 DIAGNOSIS — F331 Major depressive disorder, recurrent, moderate: Secondary | ICD-10-CM | POA: Diagnosis not present

## 2020-11-26 DIAGNOSIS — F900 Attention-deficit hyperactivity disorder, predominantly inattentive type: Secondary | ICD-10-CM | POA: Diagnosis not present

## 2020-11-26 DIAGNOSIS — F4010 Social phobia, unspecified: Secondary | ICD-10-CM | POA: Diagnosis not present

## 2020-11-26 DIAGNOSIS — F422 Mixed obsessional thoughts and acts: Secondary | ICD-10-CM

## 2020-11-26 DIAGNOSIS — G4733 Obstructive sleep apnea (adult) (pediatric): Secondary | ICD-10-CM

## 2020-11-26 MED ORDER — VENLAFAXINE HCL ER 150 MG PO CP24: 300.0000 mg | ORAL_CAPSULE | Freq: Every day | ORAL | 1 refills | Status: DC

## 2020-11-26 MED ORDER — LISDEXAMFETAMINE DIMESYLATE 70 MG PO CAPS: 70.0000 mg | ORAL_CAPSULE | Freq: Every day | ORAL | 0 refills | Status: DC

## 2020-11-26 NOTE — Progress Notes (Signed)
Nicholas Bonilla [id number] May 01, 1961 59 y.o.  Subjective:   Patient ID:  Nicholas Bonilla is a 59 y.o. (DOB [birth year]) male.  Chief Complaint:  Chief Complaint  Patient presents with  . Follow-up  . Depression  . ADHD    Depression        Associated symptoms include fatigue.  Associated symptoms include no decreased concentration and no suicidal ideas.  Past medical history includes anxiety.   Anxiety Patient reports no chest pain, confusion, decreased concentration, nervous/anxious behavior, palpitations or suicidal ideas.     Nicholas Bonilla presents to the office today for follow-up of depression anxiety, ADD and sleep problems.  seen June 2020.  No med changes were made.  Continues Vyvanse 70 mg, Effexor 225 mg daily.  December 2020 appointment the following was noted: Much better.  Sepsis July and kidney stone. Hx MI.  Better now.  More back to normal.  While hospitalized was on Effexor 150 but back 225 Effexor and it works well. Overall well except some expected bouts of loneliness and depression.  Biggest outlet has been church and Covid interfered..  Work is better and better confidence.   Patient reports stable mood and denies depressed or irritable moods.  Patient denies any recent difficulty with anxiety.  Patient denies difficulty with sleep initiation or maintenance, sleep to bed 10:30 and start waking about 8 hour.  Intermittent cathing necessary.  Not napping.. Denies appetite disturbance.  Patient reports that energy and motivation have been good.  Patient denies any difficulty with concentration.  Patient denies any suicidal ideation. Last year restarted Vyvanse and it's working very well but it's $216 month.  Pleased with it.  Adderall less effective. Really needs the stimulant for attention and focus.  Has returned to work.  Vyvanse added it and mood was much improved and clear-headed. No meds were changed  04/25/20 TC increase Effexor to 300 mg daily.  05/27/2020  appointment with the following noted: Increase Effexor helped a good deal and glad he did so. Tolerating both meds fine.  Except brief period of sleepiness in the morning for a few mins and needs to lie down.  Not always.  Is also a sense of cloudiness needing a short nap. Patient reports stable mood and denies depressed or irritable moods.  Patient denies any recent difficulty with anxiety.  Patient denies difficulty with sleep initiation or maintenance. Denies appetite disturbance.  Patient reports motivation have been good.  Energy affected by MI and beta blockers.  Patient denies any difficulty with concentration.  Patient denies any suicidal ideation. Plan: No med changes Continue Effexor XR 300 and Vyvanse 70  11/26/20 appt with following noted: Best friend killed earlier this year by stepson.   Things calmed down. Nicholas Bonilla with difficulty with eyesight and pt is driving her.  Getting along well without physical intimacy but treat each other well. Lost 15 # withWW. Generally not depressed and anxiety is managed overall.  For awhile stressors interfered with work and now back to work and it's better. vyvanse still working fine.   Tolerating meds.   Anxiety is not interfering with anything and overall good.   Past Psychiatric Medication Trials:  Adderall, modafinil, Adzenys,Daytrana,  Vyvanse Venlafaxine 300,  Prozac, citalopram,   Review of Systems:  Review of Systems  Constitutional: Positive for fatigue. Negative for diaphoresis.  Cardiovascular: Negative for chest pain and palpitations.  Musculoskeletal: Positive for arthralgias, back pain and neck stiffness.  Neurological: Negative for tremors and weakness.  Psychiatric/Behavioral: Positive for depression. Negative for agitation, behavioral problems, confusion, decreased concentration, dysphoric mood, hallucinations, self-injury, sleep disturbance and suicidal ideas. The patient is not nervous/anxious and is not hyperactive.   BP  controlled  Medications: I have reviewed the patient's current medications.  Current Outpatient Medications  Medication Sig Dispense Refill  . acetaminophen (TYLENOL) 500 MG tablet Take 1,000 mg by mouth 3 (three) times daily.    Marland Kitchen apixaban (ELIQUIS) 5 MG TABS tablet Take 1 tablet (5 mg total) by mouth 2 (two) times daily. 60 tablet 5  . aspirin 81 MG chewable tablet Chew 81 mg by mouth daily.    Marland Kitchen atorvastatin (LIPITOR) 80 MG tablet Take 80 mg by mouth daily.    . balsalazide (COLAZAL) 750 MG capsule Take 2,250 mg by mouth 3 (three) times daily.     . Cholecalciferol (VITAMIN D3) 5000 units CAPS Take 5,000 Units by mouth daily.    . Coenzyme Q10 (CO Q-10) 100 MG CAPS Take 100 mg by mouth daily.    Marland Kitchen DHEA 25 MG CAPS Take by mouth.    . metoprolol succinate (TOPROL-XL) 100 MG 24 hr tablet Take 100 mg by mouth daily. Take with or immediately following a meal.    . Multiple Vitamins-Minerals (THERAGRAN-M PREMIER 50 PLUS) TABS Take 1 tablet by mouth daily.    . polyethylene glycol (MIRALAX / GLYCOLAX) packet Take 17 g by mouth 2 (two) times daily.    Marland Kitchen Specialty Vitamins Products (PROSTATE PO) Take by mouth.    . tamsulosin (FLOMAX) 0.4 MG CAPS capsule Take 1 capsule (0.4 mg total) by mouth daily. 30 capsule 0  . lisdexamfetamine (VYVANSE) 70 MG capsule Take 1 capsule (70 mg total) by mouth daily. 30 capsule 0  . [START ON 12/24/2020] lisdexamfetamine (VYVANSE) 70 MG capsule Take 1 capsule (70 mg total) by mouth daily. 30 capsule 0  . [START ON 01/21/2021] lisdexamfetamine (VYVANSE) 70 MG capsule Take 1 capsule (70 mg total) by mouth daily. 30 capsule 0  . lisinopril (PRINIVIL,ZESTRIL) 2.5 MG tablet Take 5 mg by mouth daily.  (Patient not taking: Reported on 11/26/2020)    . venlafaxine XR (EFFEXOR-XR) 150 MG 24 hr capsule Take 2 capsules (300 mg total) by mouth daily. 180 capsule 1   No current facility-administered medications for this visit.    Medication Side Effects: None  Allergies:   Allergies  Allergen Reactions  . Codeine Rash and Other (See Comments)    Only in high doses Other reaction(s): Headache   . Latex Rash  . Naproxen Sodium Other (See Comments)    REACTION: colitis flare  . Promethazine Hcl Other (See Comments)    REACTION: mental status changes, memory loss    Past Medical History:  Diagnosis Date  . ADD (attention deficit disorder with hyperactivity)   . Arthritis   . History of blood clots    Left Leg  . History of kidney stones   . Hypertension   . OCD (obsessive compulsive disorder)   . Ulcerative colitis     Family History  Problem Relation Age of Onset  . Heart attack Father 65  . Hypertension Father   . Rheumatic fever Father   . Sudden death Father   . Cancer Sister        Larynx-smoker  . Hypertension Sister   . Heart attack Maternal Uncle        X2; > 28 yo  . Heart attack Paternal Grandfather        >  55  . Dementia Mother   . Diabetes Neg Hx   . Hyperlipidemia Neg Hx     Social History   Socioeconomic History  . Marital status: Married    Spouse name: Nicholas Bonilla  . Number of children: 0  . Years of education: 12+  . Highest education level: Some college, no degree  Occupational History  . Not on file  Tobacco Use  . Smoking status: Former Smoker    Types: Cigarettes    Quit date: 12/14/1984    Years since quitting: 35.9  . Smokeless tobacco: Never Used  Vaping Use  . Vaping Use: Never used  Substance and Sexual Activity  . Alcohol use: Yes    Comment:  1 wine or  beer/ day or less  . Drug use: No  . Sexual activity: Not on file  Other Topics Concern  . Not on file  Social History Narrative   From [employer]   Married with no children   Occasional drinks   Former smoker quit 1986   Full Code   Social Determinants of Corporate investment banker Strain: Not on file  Food Insecurity: Not on file  Transportation Needs: Not on file  Physical Activity: Not on file  Stress: Not on file  Social  Connections: Not on file  Intimate Partner Violence: Not on file    Past Medical History, Surgical history, Social history, and Family history were reviewed and updated as appropriate.   Please see review of systems for further details on the patient's review from today.   Objective:   Physical Exam:  There were no vitals taken for this visit.  Physical Exam Constitutional:      General: He is not in acute distress.    Appearance: He is well-developed.  Musculoskeletal:        General: No deformity.  Neurological:     Mental Status: He is alert and oriented to person, place, and time.     Motor: No tremor.     Coordination: Coordination normal.     Gait: Gait normal.  Psychiatric:        Attention and Perception: Attention and perception normal. He is attentive.        Mood and Affect: Mood is not anxious or depressed. Affect is not labile, blunt, angry or inappropriate.        Speech: Speech normal.        Behavior: Behavior normal.        Thought Content: Thought content normal. Thought content does not include homicidal or suicidal ideation. Thought content does not include homicidal or suicidal plan.        Cognition and Memory: Cognition normal.        Judgment: Judgment normal.     Comments: Insight intact. No auditory or visual hallucinations. No delusions.  No sig depression     Lab Review:     Component Value Date/Time   NA 140 07/10/2019 0830   NA 143 08/07/2014 1450   K 4.4 07/10/2019 0830   K 4.0 08/07/2014 1450   CL 103 07/10/2019 0830   CL 107 08/07/2014 1450   CO2 24 07/10/2019 0830   CO2 27 08/07/2014 1450   GLUCOSE 95 07/10/2019 0830   GLUCOSE 102 (H) 08/07/2014 1450   BUN 23 (H) 07/10/2019 0830   BUN 21 (H) 08/07/2014 1450   CREATININE 1.08 07/10/2019 0830   CREATININE 1.20 08/07/2014 1450   CALCIUM 9.5 07/10/2019 0830  CALCIUM 9.0 08/07/2014 1450   PROT 7.1 07/10/2019 0830   PROT 7.0 08/07/2014 1450   ALBUMIN 4.1 07/10/2019 0830    ALBUMIN 3.8 08/07/2014 1450   AST 34 07/10/2019 0830   AST 29 08/07/2014 1450   ALT 30 07/10/2019 0830   ALT 33 08/07/2014 1450   ALKPHOS 79 07/10/2019 0830   ALKPHOS 58 08/07/2014 1450   BILITOT 0.8 07/10/2019 0830   BILITOT 0.4 08/07/2014 1450   GFRNONAA >60 07/10/2019 0830   GFRNONAA >60 08/07/2014 1450   GFRAA >60 07/10/2019 0830   GFRAA >60 08/07/2014 1450       Component Value Date/Time   WBC 5.8 07/10/2019 0830   RBC 4.84 07/10/2019 0830   HGB 15.5 07/10/2019 0830   HGB 16.6 08/07/2014 1450   HCT 45.3 07/10/2019 0830   HCT 48.3 08/07/2014 1450   PLT 268 07/10/2019 0830   PLT 178 08/07/2014 1450   MCV 93.6 07/10/2019 0830   MCV 91 08/07/2014 1450   MCH 32.0 07/10/2019 0830   MCHC 34.2 07/10/2019 0830   RDW 12.3 07/10/2019 0830   RDW 12.5 08/07/2014 1450   LYMPHSABS 1.7 07/10/2019 0830   LYMPHSABS 1.8 08/07/2014 1450   MONOABS 0.8 07/10/2019 0830   MONOABS 0.5 08/07/2014 1450   EOSABS 0.0 07/10/2019 0830   EOSABS 0.1 08/07/2014 1450   BASOSABS 0.1 07/10/2019 0830   BASOSABS 0.1 08/07/2014 1450    No results found for: POCLITH, LITHIUM   No results found for: PHENYTOIN, PHENOBARB, VALPROATE, CBMZ  Obstructive sleep apnea results discussed in detail he has very mild sleep apnea with an AHI of 8.5.  He could consider CPAP but is not likely to make a huge difference.  We will reserve this option for later. .res Assessment: Plan:    Nicholas MaduroRobert was seen today for follow-up, depression and adhd.  Diagnoses and all orders for this visit:  Major depressive disorder, recurrent episode, moderate (HCC) -     venlafaxine XR (EFFEXOR-XR) 150 MG 24 hr capsule; Take 2 capsules (300 mg total) by mouth daily.  Attention deficit hyperactivity disorder (ADHD), predominantly inattentive type -     lisdexamfetamine (VYVANSE) 70 MG capsule; Take 1 capsule (70 mg total) by mouth daily. -     lisdexamfetamine (VYVANSE) 70 MG capsule; Take 1 capsule (70 mg total) by mouth daily. -      lisdexamfetamine (VYVANSE) 70 MG capsule; Take 1 capsule (70 mg total) by mouth daily.  Social anxiety disorder -     venlafaxine XR (EFFEXOR-XR) 150 MG 24 hr capsule; Take 2 capsules (300 mg total) by mouth daily.  Mixed obsessional thoughts and acts -     venlafaxine XR (EFFEXOR-XR) 150 MG 24 hr capsule; Take 2 capsules (300 mg total) by mouth daily.  Mild obstructive sleep apnea    Nicholas Bonilla has had controlled depression and poor attention and focus.  Reports the Vyvanse had a much better mood and conc effect with the Vyvanse even over the modafanil.Has tried Adzenys and Adderall also.    He wants to continue the Vyvanse 70mg .  Disc cardiac effects.  BP better with weight loss  Both his mood and focus are better on the Vyvanse.  No med changes Continue Effexor XR 300 and Vyvanse 70 Disc interactions with other supplements meds that might increase NE then he may have trouble tolerating.  Obstructive sleep apnea results discussed in detail last visist he has very mild sleep apnea with an AHI of 8.5.  He could  consider CPAP but is not likely to make a huge difference.  We will reserve this option for later.  I would recommend he share his sleep study results with his cardiologist to see if the cardiologist has any further input.  Cardiologist Dr. Corena Herter also recommended the sleep study on June 24, 2018.  Educated him about the dangers of untreated sleep apnea and and especially in association with his underlying heart disease  He agrees to this plan  FU 6 mos.  Meredith Staggers, MD, DFAPA   Please see After Visit Summary for patient specific instructions.  No future appointments.  No orders of the defined types were placed in this encounter.     -------------------------------

## 2021-01-28 ENCOUNTER — Telehealth: Payer: Self-pay | Admitting: Psychiatry

## 2021-01-28 ENCOUNTER — Other Ambulatory Visit: Payer: Self-pay | Admitting: Psychiatry

## 2021-01-28 DIAGNOSIS — F900 Attention-deficit hyperactivity disorder, predominantly inattentive type: Secondary | ICD-10-CM

## 2021-01-28 MED ORDER — LISDEXAMFETAMINE DIMESYLATE 70 MG PO CAPS: 70.0000 mg | ORAL_CAPSULE | Freq: Every day | ORAL | 0 refills | Status: DC

## 2021-01-28 NOTE — Telephone Encounter (Signed)
Patient called in today requesting refill for Vyvanse. Pharmacy [facility] in [place].

## 2021-02-03 ENCOUNTER — Telehealth: Payer: Self-pay | Admitting: Psychiatry

## 2021-02-03 NOTE — Telephone Encounter (Signed)
Patient called in today stating this his insurance has denied his Vyvanse. He is looking in getting a PA or an alternative medication. He says he is also looking into a payment plan. Ph: [phone]

## 2021-02-04 ENCOUNTER — Telehealth: Payer: Self-pay

## 2021-02-04 ENCOUNTER — Other Ambulatory Visit: Payer: Self-pay | Admitting: Psychiatry

## 2021-02-04 MED ORDER — AMPHETAMINE-DEXTROAMPHET ER 30 MG PO CP24: 30.0000 mg | ORAL_CAPSULE | Freq: Every day | ORAL | 0 refills | Status: DC

## 2021-02-04 MED ORDER — AMPHETAMINE-DEXTROAMPHETAMINE 30 MG PO TABS: ORAL_TABLET | ORAL | 0 refills | Status: DC

## 2021-02-04 NOTE — Telephone Encounter (Signed)
I talked with [name] this morning as follow up of his pt. Assistance application.  He is getting the financial info together to send with the application and should have it to me next week.  In the meantime he has run out of hs medication and is asking to get a prescription for adderall XR or such.  His insurance apparently will cover this medication.  He needs something to take until he can see if he can get pt. Assistance.

## 2021-02-04 NOTE — Telephone Encounter (Signed)
Prior Authorization submitted and DENIED for patient's VYVANSE per [employer]. There was not a reason shown but I'm sure it's related to formularies required.

## 2021-02-04 NOTE — Telephone Encounter (Signed)
Let him know I sent in the Adderall XR prescription.  Also sent in regular Adderall which she could add 1/2 tablet in either the morning or the late afternoon after the Adderall XR wears off.

## 2021-02-04 NOTE — Telephone Encounter (Signed)
This is correct they declined his Vyvanse he has [employer]

## 2021-02-05 NOTE — Telephone Encounter (Signed)
reviewed

## 2021-02-05 NOTE — Telephone Encounter (Signed)
Pt has been called and informed.  

## 2021-03-06 ENCOUNTER — Other Ambulatory Visit: Payer: Self-pay | Admitting: Psychiatry

## 2021-03-06 DIAGNOSIS — F900 Attention-deficit hyperactivity disorder, predominantly inattentive type: Secondary | ICD-10-CM

## 2021-03-06 MED ORDER — AMPHETAMINE-DEXTROAMPHET ER 30 MG PO CP24: 30.0000 mg | ORAL_CAPSULE | Freq: Every day | ORAL | 0 refills | Status: DC

## 2021-03-06 NOTE — Telephone Encounter (Signed)
Controlled substance 

## 2021-04-23 ENCOUNTER — Other Ambulatory Visit: Payer: Self-pay | Admitting: Psychiatry

## 2021-04-24 NOTE — Telephone Encounter (Signed)
APT 6/16

## 2021-05-08 ENCOUNTER — Telehealth: Payer: Self-pay | Admitting: Psychiatry

## 2021-05-08 ENCOUNTER — Other Ambulatory Visit: Payer: Self-pay | Admitting: Psychiatry

## 2021-05-08 DIAGNOSIS — F900 Attention-deficit hyperactivity disorder, predominantly inattentive type: Secondary | ICD-10-CM

## 2021-05-08 MED ORDER — LISDEXAMFETAMINE DIMESYLATE 70 MG PO CAPS
70.0000 mg | ORAL_CAPSULE | Freq: Every day | ORAL | 0 refills | Status: DC
Start: 2021-05-08 — End: 2021-05-29

## 2021-05-08 NOTE — Telephone Encounter (Signed)
[employer] has been approved for pt. Assistance for vyvanse until 05/07/22.  He will get a pharmacy card to use at the pharmacy when he picks up his medication.  Please send prescriptions for his vyvanse to [facility] in [place].  Has appt 05/29/21

## 2021-05-08 NOTE — Progress Notes (Signed)
Pt approved for pt assistance.  DC Adderall RX per message sent to pharmacy

## 2021-05-08 NOTE — Telephone Encounter (Signed)
Please send rx

## 2021-05-08 NOTE — Telephone Encounter (Signed)
sent 

## 2021-05-21 ENCOUNTER — Other Ambulatory Visit: Payer: Self-pay | Admitting: Psychiatry

## 2021-05-21 DIAGNOSIS — F331 Major depressive disorder, recurrent, moderate: Secondary | ICD-10-CM

## 2021-05-21 DIAGNOSIS — F4010 Social phobia, unspecified: Secondary | ICD-10-CM

## 2021-05-21 DIAGNOSIS — F422 Mixed obsessional thoughts and acts: Secondary | ICD-10-CM

## 2021-05-29 ENCOUNTER — Ambulatory Visit (INDEPENDENT_AMBULATORY_CARE_PROVIDER_SITE_OTHER): Payer: BLUE CROSS/BLUE SHIELD | Admitting: Psychiatry

## 2021-05-29 ENCOUNTER — Other Ambulatory Visit: Payer: Self-pay

## 2021-05-29 ENCOUNTER — Encounter: Payer: Self-pay | Admitting: Psychiatry

## 2021-05-29 DIAGNOSIS — F331 Major depressive disorder, recurrent, moderate: Secondary | ICD-10-CM | POA: Diagnosis not present

## 2021-05-29 DIAGNOSIS — F4010 Social phobia, unspecified: Secondary | ICD-10-CM | POA: Diagnosis not present

## 2021-05-29 DIAGNOSIS — F900 Attention-deficit hyperactivity disorder, predominantly inattentive type: Secondary | ICD-10-CM | POA: Diagnosis not present

## 2021-05-29 DIAGNOSIS — F422 Mixed obsessional thoughts and acts: Secondary | ICD-10-CM

## 2021-05-29 DIAGNOSIS — G4733 Obstructive sleep apnea (adult) (pediatric): Secondary | ICD-10-CM

## 2021-05-29 MED ORDER — LISDEXAMFETAMINE DIMESYLATE 70 MG PO CAPS: 70.0000 mg | ORAL_CAPSULE | Freq: Every day | ORAL | 0 refills | Status: DC

## 2021-05-29 MED ORDER — AMPHETAMINE-DEXTROAMPHETAMINE 30 MG PO TABS: 15.0000 mg | ORAL_TABLET | Freq: Two times a day (BID) | ORAL | 0 refills | Status: DC

## 2021-05-29 MED ORDER — AMPHETAMINE-DEXTROAMPHETAMINE 30 MG PO TABS: ORAL_TABLET | ORAL | 0 refills | Status: DC

## 2021-05-29 MED ORDER — LISDEXAMFETAMINE DIMESYLATE 70 MG PO CAPS
70.0000 mg | ORAL_CAPSULE | Freq: Every day | ORAL | 0 refills | Status: DC
Start: 2021-05-29 — End: 2022-01-13

## 2021-05-29 MED ORDER — VENLAFAXINE HCL ER 150 MG PO CP24: 300.0000 mg | ORAL_CAPSULE | Freq: Every day | ORAL | 1 refills | Status: DC

## 2021-05-29 NOTE — Patient Instructions (Signed)
Disc the off-label use of N-Acetylcysteine at 600 mg daily to help with mild cognitive problems.  It can be combined with a B-complex vitamin as the B-12 and folate have been shown to sometimes enhance the effect.

## 2021-05-29 NOTE — Progress Notes (Signed)
Nicholas Bonilla [id number] July 15, 1961 60 y.o.  Subjective:   Patient ID:  Nicholas Bonilla is a 60 y.o. (DOB 01-14-1961) male.  Chief Complaint:  Chief Complaint  Patient presents with   Follow-up   Depression   ADHD    Depression        Associated symptoms include fatigue.  Associated symptoms include no decreased concentration and no suicidal ideas.  Past medical history includes anxiety.   Anxiety Patient reports no chest pain, confusion, decreased concentration, nervous/anxious behavior, palpitations or suicidal ideas.    Nicholas Bonilla presents to the office today for follow-up of depression anxiety, ADD and sleep problems.  seen June 2020.  No med changes were made.  Continues Vyvanse 70 mg, Effexor 225 mg daily.  December 2020 appointment the following was noted: Much better.  Sepsis July and kidney stone. Hx MI.  Better now.  More back to normal.  While hospitalized was on Effexor 150 but back 225 Effexor and it works well. Overall well except some expected bouts of loneliness and depression.  Biggest outlet has been church and Covid interfered..  Work is better and better confidence.   Patient reports stable mood and denies depressed or irritable moods.  Patient denies any recent difficulty with anxiety.  Patient denies difficulty with sleep initiation or maintenance, sleep to bed 10:30 and start waking about 8 hour.  Intermittent cathing necessary.  Not napping.. Denies appetite disturbance.  Patient reports that energy and motivation have been good.  Patient denies any difficulty with concentration.  Patient denies any suicidal ideation. Last year restarted Vyvanse and it's working very well but it's $216 month.  Pleased with it.  Adderall less effective. Really needs the stimulant for attention and focus.  Has returned to work.  Vyvanse added it and mood was much improved and clear-headed. No meds were changed  04/25/20 TC increase Effexor to 300 mg daily.  05/27/2020  appointment with the following noted: Increase Effexor helped a good deal and glad he did so. Tolerating both meds fine.  Except brief period of sleepiness in the morning for a few mins and needs to lie down.  Not always.  Is also a sense of cloudiness needing a short nap. Patient reports stable mood and denies depressed or irritable moods.  Patient denies any recent difficulty with anxiety.  Patient denies difficulty with sleep initiation or maintenance. Denies appetite disturbance.  Patient reports motivation have been good.  Energy affected by MI and beta blockers.  Patient denies any difficulty with concentration.  Patient denies any suicidal ideation. Plan: No med changes Continue Effexor XR 300 and Vyvanse 70  11/26/20 appt with following noted: Best friend killed earlier this year by stepson.   Things calmed down. Nicholas Bonilla with difficulty with eyesight and pt is driving her.  Getting along well without physical intimacy but treat each other well. Lost 15 # withWW. Generally not depressed and anxiety is managed overall.  For awhile stressors interfered with work and now back to work and it's better. vyvanse still working fine.   Tolerating meds.   Anxiety is not interfering with anything and overall good. Plan: No med changes Continue Effexor XR 300 and Vyvanse 70  05/29/21 appt noted: Adderall alone and poor memory and focus, Vyvanse better.   Vyvanse 70 AM with Adderall 15 mg am gives quicker effect.  Tolerated well. Got pt assistance since BCBS not covering. Hx MI 2019 and CHF.  Card ok use of stimulant. Minimal depression now.  Nicholas Bonilla involuntary eye movements and can't drive.  Burdensome. Work is OK with some procrastination on cold calls. Memory concerns; names, events, no one else said anything.  Past Psychiatric Medication Trials:  Adderall, modafinil, Adzenys,Daytrana,  Vyvanse Venlafaxine 300,   Prozac, citalopram,   Review of Systems:  Review of Systems   Constitutional:  Positive for fatigue. Negative for diaphoresis.  Cardiovascular:  Negative for chest pain and palpitations.  Musculoskeletal:  Positive for arthralgias and back pain. Negative for neck stiffness.  Neurological:  Negative for tremors and weakness.  Psychiatric/Behavioral:  Negative for agitation, behavioral problems, confusion, decreased concentration, dysphoric mood, hallucinations, self-injury, sleep disturbance and suicidal ideas. The patient is not nervous/anxious and is not hyperactive.  BP controlled  Medications: I have reviewed the patient's current medications.  Current Outpatient Medications  Medication Sig Dispense Refill   acetaminophen (TYLENOL) 500 MG tablet Take 1,000 mg by mouth 3 (three) times daily.     [START ON 06/26/2021] amphetamine-dextroamphetamine (ADDERALL) 30 MG tablet Take 0.5 tablets by mouth 2 (two) times daily. 30 tablet 0   apixaban (ELIQUIS) 5 MG TABS tablet Take 1 tablet (5 mg total) by mouth 2 (two) times daily. 60 tablet 5   aspirin 81 MG chewable tablet Chew 81 mg by mouth daily.     atorvastatin (LIPITOR) 80 MG tablet Take 80 mg by mouth daily.     balsalazide (COLAZAL) 750 MG capsule Take 2,250 mg by mouth 3 (three) times daily.      Cholecalciferol (VITAMIN D3) 5000 units CAPS Take 5,000 Units by mouth daily.     Coenzyme Q10 (CO Q-10) 100 MG CAPS Take 100 mg by mouth daily.     DHEA 25 MG CAPS Take by mouth.     [START ON 06/26/2021] lisdexamfetamine (VYVANSE) 70 MG capsule Take 1 capsule (70 mg total) by mouth daily. 30 capsule 0   [START ON 07/24/2021] lisdexamfetamine (VYVANSE) 70 MG capsule Take 1 capsule (70 mg total) by mouth daily. 30 capsule 0   lisinopril (PRINIVIL,ZESTRIL) 2.5 MG tablet Take 5 mg by mouth daily.     metoprolol succinate (TOPROL-XL) 100 MG 24 hr tablet Take 100 mg by mouth daily. Take with or immediately following a meal.     Multiple Vitamins-Minerals (THERAGRAN-M PREMIER 50 PLUS) TABS Take 1 tablet by mouth  daily.     polyethylene glycol (MIRALAX / GLYCOLAX) packet Take 17 g by mouth 2 (two) times daily.     Specialty Vitamins Products (PROSTATE PO) Take by mouth.     tamsulosin (FLOMAX) 0.4 MG CAPS capsule Take 1 capsule (0.4 mg total) by mouth daily. 30 capsule 0   testosterone cypionate (DEPOTESTOSTERONE CYPIONATE) 200 MG/ML injection 0.174ml every 11 days     amphetamine-dextroamphetamine (ADDERALL) 30 MG tablet TAKE 1/2 TABLET BY MOUTH IN THE MORNING AND 3 PM 30 tablet 0   ENTRESTO 24-26 MG      lisdexamfetamine (VYVANSE) 70 MG capsule Take 1 capsule (70 mg total) by mouth daily. 30 capsule 0   venlafaxine XR (EFFEXOR-XR) 150 MG 24 hr capsule Take 2 capsules (300 mg total) by mouth daily with breakfast. 180 capsule 1   No current facility-administered medications for this visit.    Medication Side Effects: None  Allergies:  Allergies  Allergen Reactions   Codeine Rash and Other (See Comments)    Only in high doses Other reaction(s): Headache    Latex Rash   Naproxen Sodium Other (See Comments)    REACTION: colitis flare  Promethazine Hcl Other (See Comments)    REACTION: mental status changes, memory loss    Past Medical History:  Diagnosis Date   ADD (attention deficit disorder with hyperactivity)    Arthritis    History of blood clots    Left Leg   History of kidney stones    Hypertension    OCD (obsessive compulsive disorder)    Ulcerative colitis     Family History  Problem Relation Age of Onset   Heart attack Father 84   Hypertension Father    Rheumatic fever Father    Sudden death Father    Cancer Sister        Larynx-smoker   Hypertension Sister    Heart attack Maternal Uncle        X2; > 57 yo   Heart attack Paternal Grandfather        > 12   Dementia Mother    Diabetes Neg Hx    Hyperlipidemia Neg Hx     Social History   Socioeconomic History   Marital status: Married    Spouse name: Nicholas Bonilla   Number of children: 0   Years of education: 12+    Highest education level: Some college, no degree  Occupational History   Not on file  Tobacco Use   Smoking status: Former    Pack years: 0.00    Types: Cigarettes    Quit date: 12/14/1984    Years since quitting: 36.4   Smokeless tobacco: Never  Vaping Use   Vaping Use: Never used  Substance and Sexual Activity   Alcohol use: Yes    Comment:  1 wine or  beer/ day or less   Drug use: No   Sexual activity: Not on file  Other Topics Concern   Not on file  Social History Narrative   From [place]   Married with no children   Occasional drinks   Former smoker quit 1986   Full Code   Social Determinants of Corporate investment banker Strain: Not on file  Food Insecurity: Not on file  Transportation Needs: Not on file  Physical Activity: Not on file  Stress: Not on file  Social Connections: Not on file  Intimate Partner Violence: Not on file    Past Medical History, Surgical history, Social history, and Family history were reviewed and updated as appropriate.   Please see review of systems for further details on the patient's review from today.   Objective:   Physical Exam:  There were no vitals taken for this visit.  Physical Exam Constitutional:      General: He is not in acute distress.    Appearance: He is well-developed.  Musculoskeletal:        General: No deformity.  Neurological:     Mental Status: He is alert and oriented to person, place, and time.     Motor: No tremor.     Coordination: Coordination normal.     Gait: Gait normal.  Psychiatric:        Attention and Perception: Attention and perception normal. He is attentive.        Mood and Affect: Mood is not anxious or depressed. Affect is not labile, blunt, angry or inappropriate.        Speech: Speech normal.        Behavior: Behavior normal.        Thought Content: Thought content normal. Thought content does not include homicidal or suicidal ideation. Thought  content does not include  homicidal or suicidal plan.        Cognition and Memory: Cognition normal. Memory is impaired.        Judgment: Judgment normal.     Comments: Insight intact. No auditory or visual hallucinations. No delusions.  No sig depression    Lab Review:     Component Value Date/Time   NA 140 07/10/2019 0830   NA 143 08/07/2014 1450   K 4.4 07/10/2019 0830   K 4.0 08/07/2014 1450   CL 103 07/10/2019 0830   CL 107 08/07/2014 1450   CO2 24 07/10/2019 0830   CO2 27 08/07/2014 1450   GLUCOSE 95 07/10/2019 0830   GLUCOSE 102 (H) 08/07/2014 1450   BUN 23 (H) 07/10/2019 0830   BUN 21 (H) 08/07/2014 1450   CREATININE 1.08 07/10/2019 0830   CREATININE 1.20 08/07/2014 1450   CALCIUM 9.5 07/10/2019 0830   CALCIUM 9.0 08/07/2014 1450   PROT 7.1 07/10/2019 0830   PROT 7.0 08/07/2014 1450   ALBUMIN 4.1 07/10/2019 0830   ALBUMIN 3.8 08/07/2014 1450   AST 34 07/10/2019 0830   AST 29 08/07/2014 1450   ALT 30 07/10/2019 0830   ALT 33 08/07/2014 1450   ALKPHOS 79 07/10/2019 0830   ALKPHOS 58 08/07/2014 1450   BILITOT 0.8 07/10/2019 0830   BILITOT 0.4 08/07/2014 1450   GFRNONAA >60 07/10/2019 0830   GFRNONAA >60 08/07/2014 1450   GFRAA >60 07/10/2019 0830   GFRAA >60 08/07/2014 1450       Component Value Date/Time   WBC 5.8 07/10/2019 0830   RBC 4.84 07/10/2019 0830   HGB 15.5 07/10/2019 0830   HGB 16.6 08/07/2014 1450   HCT 45.3 07/10/2019 0830   HCT 48.3 08/07/2014 1450   PLT 268 07/10/2019 0830   PLT 178 08/07/2014 1450   MCV 93.6 07/10/2019 0830   MCV 91 08/07/2014 1450   MCH 32.0 07/10/2019 0830   MCHC 34.2 07/10/2019 0830   RDW 12.3 07/10/2019 0830   RDW 12.5 08/07/2014 1450   LYMPHSABS 1.7 07/10/2019 0830   LYMPHSABS 1.8 08/07/2014 1450   MONOABS 0.8 07/10/2019 0830   MONOABS 0.5 08/07/2014 1450   EOSABS 0.0 07/10/2019 0830   EOSABS 0.1 08/07/2014 1450   BASOSABS 0.1 07/10/2019 0830   BASOSABS 0.1 08/07/2014 1450    No results found for: POCLITH, LITHIUM   No results  found for: PHENYTOIN, PHENOBARB, VALPROATE, CBMZ  Obstructive sleep apnea results discussed in detail he has very mild sleep apnea with an AHI of 8.5.  He could consider CPAP but is not likely to make a huge difference.  We will reserve this option for later. .res Assessment: Plan:    Nicholas Bonilla was seen today for follow-up, depression and adhd.  Diagnoses and all orders for this visit:  Major depressive disorder, recurrent episode, moderate (HCC) -     venlafaxine XR (EFFEXOR-XR) 150 MG 24 hr capsule; Take 2 capsules (300 mg total) by mouth daily with breakfast.  Attention deficit hyperactivity disorder (ADHD), predominantly inattentive type -     lisdexamfetamine (VYVANSE) 70 MG capsule; Take 1 capsule (70 mg total) by mouth daily. -     lisdexamfetamine (VYVANSE) 70 MG capsule; Take 1 capsule (70 mg total) by mouth daily. -     lisdexamfetamine (VYVANSE) 70 MG capsule; Take 1 capsule (70 mg total) by mouth daily. -     amphetamine-dextroamphetamine (ADDERALL) 30 MG tablet; TAKE 1/2 TABLET BY MOUTH IN THE MORNING  AND 3 PM  Social anxiety disorder -     venlafaxine XR (EFFEXOR-XR) 150 MG 24 hr capsule; Take 2 capsules (300 mg total) by mouth daily with breakfast.  Mild obstructive sleep apnea  Mixed obsessional thoughts and acts -     venlafaxine XR (EFFEXOR-XR) 150 MG 24 hr capsule; Take 2 capsules (300 mg total) by mouth daily with breakfast.  Other orders -     amphetamine-dextroamphetamine (ADDERALL) 30 MG tablet; Take 0.5 tablets by mouth 2 (two) times daily.   Nicholas Bonilla has had controlled depression and poor attention and focus. But also memory concerns.    Reports the Vyvanse had a much better mood and conc effect with the Vyvanse even over the modafanil.Has tried Adzenys and Adderall also.    He wants to continue the Vyvanse 70mg .  Disc cardiac effects.  BP better with weight loss  Both his mood and focus are better on the Vyvanse.  No med changes Continue Effexor XR 300 and Vyvanse  70 Disc interactions with other supplements meds that might increase NE then he may have trouble tolerating.  Obstructive sleep apnea results discussed in detail last visist he has very mild sleep apnea with an AHI of 8.5.  He could consider CPAP but is not likely to make a huge difference.  We will reserve this option for later.  I would recommend he share his sleep study results with his cardiologist to see if the cardiologist has any further input.  Cardiologist Dr. also recommended the sleep study on June 24, 2018.  Educated him about the dangers of untreated sleep apnea and and especially in association with his underlying heart disease His sleep apnea was not deemed severe  Ok trial Namenda for memory per his request  Disc the off-label use of N-Acetylcysteine at 600 mg daily to help with mild cognitive problems.  It can be combined with a B-complex vitamin as the B-12 and folate have been shown to sometimes enhance the effect. Disc memory supplement options including lithium.  Disc post article.  He agrees to this plan  FU 6 mos.  June 26, 2018, MD, DFAPA   Please see After Visit Summary for patient specific instructions.  No future appointments.  No orders of the defined types were placed in this encounter.     -------------------------------

## 2021-07-30 ENCOUNTER — Other Ambulatory Visit: Payer: Self-pay | Admitting: Psychiatry

## 2021-07-30 DIAGNOSIS — F900 Attention-deficit hyperactivity disorder, predominantly inattentive type: Secondary | ICD-10-CM

## 2021-07-30 MED ORDER — AMPHETAMINE-DEXTROAMPHETAMINE 30 MG PO TABS: ORAL_TABLET | ORAL | 0 refills | Status: DC

## 2021-07-30 MED ORDER — AMPHETAMINE-DEXTROAMPHETAMINE 30 MG PO TABS: 15.0000 mg | ORAL_TABLET | Freq: Two times a day (BID) | ORAL | 0 refills | Status: DC

## 2021-09-17 ENCOUNTER — Telehealth: Payer: Self-pay | Admitting: Psychiatry

## 2021-09-17 NOTE — Telephone Encounter (Signed)
Xan called because when he picked up his refill for her venlafaxine is only #60.  The pharmacy told him the insurance would not allow 2/day and would approve 1/day. He had been using [facility] to fill this medication until he met his deductible so insurance wasn't involved in the last refill.  Anyway, it needs a PA but since the pharmacy filled it, though incorrectly, they may not have sent a request for a PA.

## 2021-09-17 NOTE — Telephone Encounter (Signed)
I'm unclear about what they are referring to. I will check into a PA

## 2021-09-17 NOTE — Telephone Encounter (Signed)
The pharmacy told him to have the PA back dated to 09/15/21

## 2021-09-17 NOTE — Telephone Encounter (Signed)
Please review

## 2021-09-23 NOTE — Telephone Encounter (Signed)
Just an update his PA is still pended, waiting for determination

## 2021-09-25 ENCOUNTER — Other Ambulatory Visit: Payer: Self-pay | Admitting: Psychiatry

## 2021-09-25 NOTE — Telephone Encounter (Signed)
I'm unclear how to back date a PA, but he does have an approval effective 09/23/2021-09/22/2022 for Venlafaxine ER 150 mg with Cheri Rous are you aware of how to back date a PA?

## 2021-10-30 ENCOUNTER — Other Ambulatory Visit: Payer: Self-pay | Admitting: Psychiatry

## 2021-10-30 DIAGNOSIS — F900 Attention-deficit hyperactivity disorder, predominantly inattentive type: Secondary | ICD-10-CM

## 2021-10-31 ENCOUNTER — Other Ambulatory Visit: Payer: Self-pay | Admitting: Psychiatry

## 2021-10-31 NOTE — Telephone Encounter (Signed)
Last filled 10/14 appt on 11/25/21

## 2021-11-25 ENCOUNTER — Ambulatory Visit: Payer: BLUE CROSS/BLUE SHIELD | Admitting: Psychiatry

## 2021-12-01 ENCOUNTER — Other Ambulatory Visit: Payer: Self-pay | Admitting: Psychiatry

## 2021-12-01 DIAGNOSIS — F900 Attention-deficit hyperactivity disorder, predominantly inattentive type: Secondary | ICD-10-CM

## 2021-12-02 ENCOUNTER — Other Ambulatory Visit: Payer: Self-pay | Admitting: Psychiatry

## 2021-12-02 DIAGNOSIS — F900 Attention-deficit hyperactivity disorder, predominantly inattentive type: Secondary | ICD-10-CM

## 2021-12-03 NOTE — Telephone Encounter (Signed)
Please refuse,you sent this already

## 2021-12-12 ENCOUNTER — Other Ambulatory Visit: Payer: Self-pay | Admitting: Psychiatry

## 2021-12-27 ENCOUNTER — Other Ambulatory Visit: Payer: Self-pay | Admitting: Psychiatry

## 2021-12-27 DIAGNOSIS — F900 Attention-deficit hyperactivity disorder, predominantly inattentive type: Secondary | ICD-10-CM

## 2021-12-29 NOTE — Telephone Encounter (Signed)
Last filled Vyvanse 12/20

## 2022-01-13 ENCOUNTER — Encounter: Payer: Self-pay | Admitting: Psychiatry

## 2022-01-13 ENCOUNTER — Telehealth (INDEPENDENT_AMBULATORY_CARE_PROVIDER_SITE_OTHER): Payer: BLUE CROSS/BLUE SHIELD | Admitting: Psychiatry

## 2022-01-13 DIAGNOSIS — F331 Major depressive disorder, recurrent, moderate: Secondary | ICD-10-CM

## 2022-01-13 DIAGNOSIS — F900 Attention-deficit hyperactivity disorder, predominantly inattentive type: Secondary | ICD-10-CM

## 2022-01-13 DIAGNOSIS — F422 Mixed obsessional thoughts and acts: Secondary | ICD-10-CM

## 2022-01-13 DIAGNOSIS — F4010 Social phobia, unspecified: Secondary | ICD-10-CM

## 2022-01-13 MED ORDER — LISDEXAMFETAMINE DIMESYLATE 70 MG PO CAPS: 70.0000 mg | ORAL_CAPSULE | Freq: Every day | ORAL | 0 refills | Status: DC

## 2022-01-13 MED ORDER — AMPHETAMINE-DEXTROAMPHETAMINE 30 MG PO TABS: 15.0000 mg | ORAL_TABLET | Freq: Two times a day (BID) | ORAL | 0 refills | Status: DC

## 2022-01-13 MED ORDER — AMPHETAMINE-DEXTROAMPHETAMINE 30 MG PO TABS: ORAL_TABLET | ORAL | 0 refills | Status: DC

## 2022-01-13 MED ORDER — VENLAFAXINE HCL ER 150 MG PO CP24: 300.0000 mg | ORAL_CAPSULE | Freq: Every day | ORAL | 1 refills | Status: DC

## 2022-01-13 NOTE — Progress Notes (Signed)
BURDELL POMPER [id number] 11-01-1961 61 y.o.  Subjective:   Patient ID:  Nicholas Bonilla is a 61 y.o. (DOB Jul 16, 1961) male.  Chief Complaint:  Chief Complaint  Patient presents with   Follow-up   ADHD   Depression   Memory Loss    Depression        Associated symptoms include fatigue.  Associated symptoms include no decreased concentration and no suicidal ideas.  Past medical history includes anxiety.   Anxiety Patient reports no confusion, decreased concentration, nervous/anxious behavior, palpitations or suicidal ideas.    Nicholas Bonilla presents to the office today for follow-up of depression anxiety, ADD and sleep problems.  seen June 2020.  No med changes were made.  Continues Vyvanse 70 mg, Effexor 225 mg daily.  December 2020 appointment the following was noted: Much better.  Sepsis July and kidney stone. Hx MI.  Better now.  More back to normal.  While hospitalized was on Effexor 150 but back 225 Effexor and it works well. Overall well except some expected bouts of loneliness and depression.  Biggest outlet has been church and Covid interfered..  Work is better and better confidence.   Patient reports stable mood and denies depressed or irritable moods.  Patient denies any recent difficulty with anxiety.  Patient denies difficulty with sleep initiation or maintenance, sleep to bed 10:30 and start waking about 8 hour.  Intermittent cathing necessary.  Not napping.. Denies appetite disturbance.  Patient reports that energy and motivation have been good.  Patient denies any difficulty with concentration.  Patient denies any suicidal ideation. Last year restarted Vyvanse and it's working very well but it's $216 month.  Pleased with it.  Adderall less effective. Really needs the stimulant for attention and focus.  Has returned to work.  Vyvanse added it and mood was much improved and clear-headed. No meds were changed  04/25/20 TC increase Effexor to 300 mg daily.  05/27/2020  appointment with the following noted: Increase Effexor helped a good deal and glad he did so. Tolerating both meds fine.  Except brief period of sleepiness in the morning for a few mins and needs to lie down.  Not always.  Is also a sense of cloudiness needing a short nap. Patient reports stable mood and denies depressed or irritable moods.  Patient denies any recent difficulty with anxiety.  Patient denies difficulty with sleep initiation or maintenance. Denies appetite disturbance.  Patient reports motivation have been good.  Energy affected by MI and beta blockers.  Patient denies any difficulty with concentration.  Patient denies any suicidal ideation. Plan: No med changes Continue Effexor XR 300 and Vyvanse 70  11/26/20 appt with following noted: Best friend killed earlier this year by stepson.   Things calmed down. Nicholas Bonilla with difficulty with eyesight and pt is driving her.  Getting along well without physical intimacy but treat each other well. Lost 15 # withWW. Generally not depressed and anxiety is managed overall.  For awhile stressors interfered with work and now back to work and it's better. vyvanse still working fine.   Tolerating meds.   Anxiety is not interfering with anything and overall good. Plan: No med changes Continue Effexor XR 300 and Vyvanse 70  05/29/21 appt noted: Adderall alone and poor memory and focus, Vyvanse better.   Vyvanse 70 AM with Adderall 15 mg am gives quicker effect.  Tolerated well. Got pt assistance since BCBS not covering. Hx MI 2019 and CHF.  Card ok use of stimulant. Minimal  depression now.  Nicholas Bonilla involuntary eye movements and can't drive.  Burdensome. Work is OK with some procrastination on cold calls. Memory concerns; names, events, no one else said anything. Plan: Continue Effexor XR 300 mg daily and Vyvanse 70 mg daily Trial of Namenda per his request for memory.  This can be used off label for ADHD as well  01/13/2022 appointment with  the following noted: Still hit a tired spot in the afternoon with a little brain fog. May split Adderall 15 mg BID with Vyvanse 70 AM Never tried Namenda Trying supplement NeuroQ for brain fog. Wife says he's still forgetful. Alright with mood and anxiety.  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 Satisfied with meds otherwise.   Past Psychiatric Medication Trials:  Adderall, modafinil, Adzenys,Daytrana,  Vyvanse Venlafaxine 300,   Prozac, citalopram,   Review of Systems:  Review of Systems  Constitutional:  Positive for fatigue. Negative for diaphoresis.  Cardiovascular:  Negative for palpitations.  Musculoskeletal:  Positive for arthralgias and back pain. Negative for neck stiffness.  Neurological:  Negative for tremors and weakness.  Psychiatric/Behavioral:  Negative for agitation, behavioral problems, confusion, decreased concentration, dysphoric mood, hallucinations, self-injury, sleep disturbance and suicidal ideas. The patient is not nervous/anxious and is not hyperactive.  BP controlled  Medications: I have reviewed the patient's current medications.  Current Outpatient Medications  Medication Sig Dispense Refill   acetaminophen (TYLENOL) 500 MG tablet Take 1,000 mg by mouth 3 (three) times daily.     amphetamine-dextroamphetamine (ADDERALL) 30 MG tablet TAKE 1/2 TABLET BY MOUTH 2 TIMES DAILY 30 tablet 0   apixaban (ELIQUIS) 5 MG TABS tablet Take 1 tablet (5 mg total) by mouth 2 (two) times daily. 60 tablet 5   aspirin 81 MG chewable tablet Chew 81 mg by mouth daily.     atorvastatin (LIPITOR) 80 MG tablet Take 80 mg by mouth daily.     balsalazide (COLAZAL) 750 MG capsule Take 2,250 mg by mouth 3 (three) times daily.      Cholecalciferol (VITAMIN D3) 5000 units CAPS Take 5,000 Units by mouth daily.     Coenzyme Q10 (CO Q-10) 100 MG CAPS Take 100 mg by mouth daily.     DHEA 25 MG CAPS Take by mouth.     ENTRESTO 24-26 MG       metoprolol succinate (TOPROL-XL) 100 MG 24 hr tablet Take 100 mg by mouth daily. Take with or immediately following a meal.     Multiple Vitamins-Minerals (THERAGRAN-M PREMIER 50 PLUS) TABS Take 1 tablet by mouth daily.     polyethylene glycol (MIRALAX / GLYCOLAX) packet Take 17 g by mouth 2 (two) times daily.     testosterone cypionate (DEPOTESTOSTERONE CYPIONATE) 200 MG/ML injection 0.31ml every 11 days     amphetamine-dextroamphetamine (ADDERALL) 30 MG tablet TAKE 1/2 TABLET BY MOUTH IN THE MORNING AND 3 PM 30 tablet 0   [START ON 02/10/2022] amphetamine-dextroamphetamine (ADDERALL) 30 MG tablet Take 0.5 tablets by mouth 2 (two) times daily. 30 tablet 0   [START ON 03/10/2022] amphetamine-dextroamphetamine (ADDERALL) 30 MG tablet Take 0.5 tablets by mouth 2 (two) times daily. 30 tablet 0   lisdexamfetamine (VYVANSE) 70 MG capsule Take 1 capsule (70 mg total) by mouth daily. 30 capsule 0   [START ON 02/10/2022] lisdexamfetamine (VYVANSE) 70 MG capsule Take 1 capsule (70 mg total) by mouth daily. 30 capsule 0   [START ON 03/10/2022] lisdexamfetamine (VYVANSE) 70 MG capsule Take 1 capsule (70 mg  total) by mouth daily. 30 capsule 0   lisinopril (PRINIVIL,ZESTRIL) 2.5 MG tablet Take 5 mg by mouth daily. (Patient not taking: Reported on 01/13/2022)     Specialty Vitamins Products (PROSTATE PO) Take by mouth. (Patient not taking: Reported on 01/13/2022)     tamsulosin (FLOMAX) 0.4 MG CAPS capsule Take 1 capsule (0.4 mg total) by mouth daily. (Patient not taking: Reported on 01/13/2022) 30 capsule 0   venlafaxine XR (EFFEXOR-XR) 150 MG 24 hr capsule Take 2 capsules (300 mg total) by mouth daily with breakfast. 180 capsule 1   No current facility-administered medications for this visit.    Medication Side Effects: None  Allergies:  Allergies  Allergen Reactions   Codeine Rash and Other (See Comments)    Only in high doses Other reaction(s): Headache    Latex Rash   Naproxen Sodium Other (See Comments)     REACTION: colitis flare   Promethazine Hcl Other (See Comments)    REACTION: mental status changes, memory loss    Past Medical History:  Diagnosis Date   ADD (attention deficit disorder with hyperactivity)    Arthritis    History of blood clots    Left Leg   History of kidney stones    Hypertension    OCD (obsessive compulsive disorder)    Ulcerative colitis     Family History  Problem Relation Age of Onset   Heart attack Father 65   Hypertension Father    Rheumatic fever Father    Sudden death Father    Cancer Sister        Larynx-smoker   Hypertension Sister    Heart attack Maternal Uncle        X2; > 108 yo   Heart attack Paternal Grandfather        > 57   Dementia Mother    Diabetes Neg Hx    Hyperlipidemia Neg Hx     Social History   Socioeconomic History   Marital status: Married    Spouse name: Nicholas Bonilla   Number of children: 0   Years of education: 12+   Highest education Bonilla: Some college, no degree  Occupational History   Not on file  Tobacco Use   Smoking status: Former    Types: Cigarettes    Quit date: 12/14/1984    Years since quitting: 37.1   Smokeless tobacco: Never  Vaping Use   Vaping Use: Never used  Substance and Sexual Activity   Alcohol use: Yes    Comment:  1 wine or  beer/ day or less   Drug use: No   Sexual activity: Not on file  Other Topics Concern   Not on file  Social History Narrative   From [place]   Married with no children   Occasional drinks   Former smoker quit 1986   Full Code   Social Determinants of Radio broadcast assistant Strain: Not on file  Food Insecurity: Not on file  Transportation Needs: Not on file  Physical Activity: Not on file  Stress: Not on file  Social Connections: Not on file  Intimate Partner Violence: Not on file    Past Medical History, Surgical history, Social history, and Family history were reviewed and updated as appropriate.   Please see review of systems for  further details on the patient's review from today.   Objective:   Physical Exam:  There were no vitals taken for this visit.  Physical Exam Constitutional:  General: He is not in acute distress.    Appearance: He is well-developed.  Musculoskeletal:        General: No deformity.  Neurological:     Mental Status: He is alert and oriented to person, place, and time.     Motor: No tremor.     Coordination: Coordination normal.     Gait: Gait normal.  Psychiatric:        Attention and Perception: Attention and perception normal. He is attentive.        Mood and Affect: Mood is not anxious or depressed. Affect is not labile, blunt, angry or inappropriate.        Speech: Speech normal.        Behavior: Behavior normal.        Thought Content: Thought content normal. Thought content is not delusional. Thought content does not include homicidal or suicidal ideation. Thought content does not include suicidal plan.        Cognition and Memory: Cognition normal. Memory is impaired.        Judgment: Judgment normal.     Comments: Insight intact. No auditory or visual hallucinations. No delusions.  No sig depression    Lab Review:     Component Value Date/Time   NA 140 07/10/2019 0830   NA 143 08/07/2014 1450   K 4.4 07/10/2019 0830   K 4.0 08/07/2014 1450   CL 103 07/10/2019 0830   CL 107 08/07/2014 1450   CO2 24 07/10/2019 0830   CO2 27 08/07/2014 1450   GLUCOSE 95 07/10/2019 0830   GLUCOSE 102 (H) 08/07/2014 1450   BUN 23 (H) 07/10/2019 0830   BUN 21 (H) 08/07/2014 1450   CREATININE 1.08 07/10/2019 0830   CREATININE 1.20 08/07/2014 1450   CALCIUM 9.5 07/10/2019 0830   CALCIUM 9.0 08/07/2014 1450   PROT 7.1 07/10/2019 0830   PROT 7.0 08/07/2014 1450   ALBUMIN 4.1 07/10/2019 0830   ALBUMIN 3.8 08/07/2014 1450   AST 34 07/10/2019 0830   AST 29 08/07/2014 1450   ALT 30 07/10/2019 0830   ALT 33 08/07/2014 1450   ALKPHOS 79 07/10/2019 0830   ALKPHOS 58 08/07/2014 1450    BILITOT 0.8 07/10/2019 0830   BILITOT 0.4 08/07/2014 1450   GFRNONAA >60 07/10/2019 0830   GFRNONAA >60 08/07/2014 1450   GFRAA >60 07/10/2019 0830   GFRAA >60 08/07/2014 1450       Component Value Date/Time   WBC 5.8 07/10/2019 0830   RBC 4.84 07/10/2019 0830   HGB 15.5 07/10/2019 0830   HGB 16.6 08/07/2014 1450   HCT 45.3 07/10/2019 0830   HCT 48.3 08/07/2014 1450   PLT 268 07/10/2019 0830   PLT 178 08/07/2014 1450   MCV 93.6 07/10/2019 0830   MCV 91 08/07/2014 1450   MCH 32.0 07/10/2019 0830   MCHC 34.2 07/10/2019 0830   RDW 12.3 07/10/2019 0830   RDW 12.5 08/07/2014 1450   LYMPHSABS 1.7 07/10/2019 0830   LYMPHSABS 1.8 08/07/2014 1450   MONOABS 0.8 07/10/2019 0830   MONOABS 0.5 08/07/2014 1450   EOSABS 0.0 07/10/2019 0830   EOSABS 0.1 08/07/2014 1450   BASOSABS 0.1 07/10/2019 0830   BASOSABS 0.1 08/07/2014 1450    No results found for: POCLITH, LITHIUM   No results found for: PHENYTOIN, PHENOBARB, VALPROATE, CBMZ  Obstructive sleep apnea results discussed in detail he has very mild sleep apnea with an AHI of 8.5.  He could consider CPAP but is not likely  to make a huge difference.  We will reserve this option for later. .res Assessment: Plan:    Lim was seen today for follow-up, adhd, depression and memory loss.  Diagnoses and all orders for this visit:  Major depressive disorder, recurrent episode, moderate (HCC) -     venlafaxine XR (EFFEXOR-XR) 150 MG 24 hr capsule; Take 2 capsules (300 mg total) by mouth daily with breakfast.  Social anxiety disorder -     venlafaxine XR (EFFEXOR-XR) 150 MG 24 hr capsule; Take 2 capsules (300 mg total) by mouth daily with breakfast.  Mixed obsessional thoughts and acts -     venlafaxine XR (EFFEXOR-XR) 150 MG 24 hr capsule; Take 2 capsules (300 mg total) by mouth daily with breakfast.  Attention deficit hyperactivity disorder (ADHD), predominantly inattentive type -     lisdexamfetamine (VYVANSE) 70 MG capsule; Take  1 capsule (70 mg total) by mouth daily. -     lisdexamfetamine (VYVANSE) 70 MG capsule; Take 1 capsule (70 mg total) by mouth daily. -     lisdexamfetamine (VYVANSE) 70 MG capsule; Take 1 capsule (70 mg total) by mouth daily. -     amphetamine-dextroamphetamine (ADDERALL) 30 MG tablet; TAKE 1/2 TABLET BY MOUTH IN THE MORNING AND 3 PM -     amphetamine-dextroamphetamine (ADDERALL) 30 MG tablet; Take 0.5 tablets by mouth 2 (two) times daily. -     amphetamine-dextroamphetamine (ADDERALL) 30 MG tablet; Take 0.5 tablets by mouth 2 (two) times daily.    Nicholas Bonilla has had controlled depression and poor attention and focus. But also memory concerns.    Reports the Vyvanse had a much better mood and conc effect with the Vyvanse even over the modafanil.Has tried Adzenys and Adderall also.    He wants to continue the Vyvanse 70mg .  Disc cardiac effects.   Both his mood and focus are better on the Vyvanse.  No med changes Continue Effexor XR 300 and Vyvanse 70 and Adderalll 15 MG AM PM prn Trial split Effexor 150 BID to see if afternoon sleepiness is better. Disc interactions with other supplements meds that might increase NE then he may have trouble tolerating.  Obstructive sleep apnea results discussed in detail last visist he has very mild sleep apnea with an AHI of 8.5.  He could consider CPAP but is not likely to make a huge difference.  We will reserve this option for later.  I would recommend he share his sleep study results with his cardiologist to see if the cardiologist has any further input.  Cardiologist Dr. Becky Augusta also recommended the sleep study on June 24, 2018.  Educated him about the dangers of untreated sleep apnea and and especially in association with his underlying heart disease His sleep apnea was not deemed severe Because of persistent brain fog complaints and cardiac issues recommend he talk with his cardiologist about possibly repeating the sleep study and a lab.  It has been several  years since his home sleep study was done.  defer Namenda for memory per his request  Disc the off-label use of N-Acetylcysteine at 600 mg daily to help with mild cognitive problems.  It can be combined with a B-complex vitamin as the B-12 and folate have been shown to sometimes enhance the effect. Disc memory supplement options including lithium.  Disc post article.  He agrees to this plan  FU 6 mos.  Lynder Parents, MD, DFAPA   Please see After Visit Summary for patient specific instructions.  No future appointments.  No orders of the defined types were placed in this encounter.      -------------------------------

## 2022-02-18 ENCOUNTER — Other Ambulatory Visit: Payer: Self-pay

## 2022-02-18 ENCOUNTER — Telehealth: Payer: Self-pay | Admitting: Psychiatry

## 2022-02-18 MED ORDER — AMPHETAMINE-DEXTROAMPHETAMINE 30 MG PO TABS: ORAL_TABLET | ORAL | 0 refills | Status: DC

## 2022-02-18 NOTE — Telephone Encounter (Signed)
Next visit is 07/15/22. Shlok called and said that when he went to pick up his Adderall from the pharmacy they told him that it had been D/C? Pharmacy is: ? ?[facility] - [place], [place] - [address]  ?[address] [place], [place] [address]  ?Phone:  [phone]  Fax:  [phone]  ? ? ?

## 2022-02-18 NOTE — Telephone Encounter (Signed)
Pended.

## 2022-03-27 ENCOUNTER — Other Ambulatory Visit: Payer: Self-pay | Admitting: Psychiatry

## 2022-04-23 ENCOUNTER — Other Ambulatory Visit: Payer: Self-pay | Admitting: Psychiatry

## 2022-04-23 DIAGNOSIS — F900 Attention-deficit hyperactivity disorder, predominantly inattentive type: Secondary | ICD-10-CM

## 2022-04-23 MED ORDER — AMPHETAMINE-DEXTROAMPHETAMINE 30 MG PO TABS: ORAL_TABLET | ORAL | 0 refills | Status: DC

## 2022-04-23 MED ORDER — AMPHETAMINE-DEXTROAMPHETAMINE 30 MG PO TABS: 15.0000 mg | ORAL_TABLET | Freq: Two times a day (BID) | ORAL | 0 refills | Status: DC

## 2022-04-23 MED ORDER — LISDEXAMFETAMINE DIMESYLATE 70 MG PO CAPS: 70.0000 mg | ORAL_CAPSULE | Freq: Every day | ORAL | 0 refills | Status: DC

## 2022-04-24 ENCOUNTER — Other Ambulatory Visit: Payer: Self-pay | Admitting: Psychiatry

## 2022-04-24 DIAGNOSIS — F900 Attention-deficit hyperactivity disorder, predominantly inattentive type: Secondary | ICD-10-CM

## 2022-07-02 ENCOUNTER — Telehealth: Payer: Self-pay | Admitting: Psychiatry

## 2022-07-02 NOTE — Telephone Encounter (Signed)
Henrik has been re-approved for pt. Assistance for vyvanse throught 12/13/22.  They will send him a voucher to use at the pharmacy.  His prescriptions will need to be available at the pharmacy for him to pick up.  Please chart

## 2022-07-15 ENCOUNTER — Ambulatory Visit: Payer: BLUE CROSS/BLUE SHIELD | Admitting: Psychiatry

## 2022-07-16 ENCOUNTER — Ambulatory Visit (INDEPENDENT_AMBULATORY_CARE_PROVIDER_SITE_OTHER): Payer: BLUE CROSS/BLUE SHIELD | Admitting: Psychiatry

## 2022-07-16 ENCOUNTER — Encounter: Payer: Self-pay | Admitting: Psychiatry

## 2022-07-16 VITALS — BP 134/82 | HR 83

## 2022-07-16 DIAGNOSIS — F900 Attention-deficit hyperactivity disorder, predominantly inattentive type: Secondary | ICD-10-CM

## 2022-07-16 DIAGNOSIS — F422 Mixed obsessional thoughts and acts: Secondary | ICD-10-CM

## 2022-07-16 DIAGNOSIS — G4733 Obstructive sleep apnea (adult) (pediatric): Secondary | ICD-10-CM

## 2022-07-16 DIAGNOSIS — F331 Major depressive disorder, recurrent, moderate: Secondary | ICD-10-CM

## 2022-07-16 DIAGNOSIS — F4010 Social phobia, unspecified: Secondary | ICD-10-CM

## 2022-07-16 MED ORDER — AMPHETAMINE-DEXTROAMPHETAMINE 30 MG PO TABS
ORAL_TABLET | ORAL | 0 refills | Status: DC
Start: 2022-08-13 — End: 2022-10-13

## 2022-07-16 MED ORDER — LISDEXAMFETAMINE DIMESYLATE 70 MG PO CAPS
70.0000 mg | ORAL_CAPSULE | Freq: Every day | ORAL | 0 refills | Status: DC
Start: 2022-08-13 — End: 2022-10-13

## 2022-07-16 MED ORDER — LISDEXAMFETAMINE DIMESYLATE 70 MG PO CAPS: 70.0000 mg | ORAL_CAPSULE | Freq: Every day | ORAL | 0 refills | Status: DC

## 2022-07-16 MED ORDER — AMPHETAMINE-DEXTROAMPHETAMINE 30 MG PO TABS: 15.0000 mg | ORAL_TABLET | Freq: Two times a day (BID) | ORAL | 0 refills | Status: DC

## 2022-07-16 MED ORDER — LISDEXAMFETAMINE DIMESYLATE 70 MG PO CAPS
70.0000 mg | ORAL_CAPSULE | Freq: Every day | ORAL | 0 refills | Status: DC
Start: 2022-07-16 — End: 2022-10-13

## 2022-07-16 MED ORDER — BUPROPION HCL ER (XL) 150 MG PO TB24: ORAL_TABLET | ORAL | 0 refills | Status: DC

## 2022-07-16 MED ORDER — AMPHETAMINE-DEXTROAMPHETAMINE 30 MG PO TABS
ORAL_TABLET | ORAL | 0 refills | Status: DC
Start: 2022-09-10 — End: 2022-10-13

## 2022-07-16 NOTE — Patient Instructions (Signed)
(  NAC) N-Acetylcysteine 2 of the  600 mg capsules daily to help with mild cognitive problems.  It can be combined with a B-complex vitamin as the B-12 and folate which can sometimes enhance the effect.  

## 2022-07-16 NOTE — Progress Notes (Signed)
Nicholas Bonilla [id number] 1961-06-06 61 y.o.  Subjective:   Patient ID:  Nicholas Bonilla is a 61 y.o. (DOB 10-27-1961) male.  Chief Complaint:  Chief Complaint  Patient presents with   Follow-up   Depression   ADHD    Depression        Associated symptoms include fatigue.  Associated symptoms include no decreased concentration and no suicidal ideas.  Past medical history includes anxiety.   Anxiety Patient reports no confusion, decreased concentration, nervous/anxious behavior, palpitations or suicidal ideas.     Nicholas Bonilla presents to the office today for follow-up of depression anxiety, ADD and sleep problems.  seen June 2020.  No med changes were made.  Continues Vyvanse 70 mg, Effexor 225 mg daily.  December 2020 appointment the following was noted: Much better.  Sepsis July and kidney stone. Hx MI.  Better now.  More back to normal.  While hospitalized was on Effexor 150 but back 225 Effexor and it works well. Overall well except some expected bouts of loneliness and depression.  Biggest outlet has been church and Covid interfered..  Work is better and better confidence.   Patient reports stable mood and denies depressed or irritable moods.  Patient denies any recent difficulty with anxiety.  Patient denies difficulty with sleep initiation or maintenance, sleep to bed 10:30 and start waking about 8 hour.  Intermittent cathing necessary.  Not napping.. Denies appetite disturbance.  Patient reports that energy and motivation have been good.  Patient denies any difficulty with concentration.  Patient denies any suicidal ideation. Last year restarted Vyvanse and it's working very well but it's $216 month.  Pleased with it.  Adderall less effective. Really needs the stimulant for attention and focus.  Has returned to work.  Vyvanse added it and mood was much improved and clear-headed. No meds were changed  04/25/20 TC increase Effexor to 300 mg daily.  05/27/2020 appointment with  the following noted: Increase Effexor helped a good deal and glad he did so. Tolerating both meds fine.  Except brief period of sleepiness in the morning for a few mins and needs to lie down.  Not always.  Is also a sense of cloudiness needing a short nap. Patient reports stable mood and denies depressed or irritable moods.  Patient denies any recent difficulty with anxiety.  Patient denies difficulty with sleep initiation or maintenance. Denies appetite disturbance.  Patient reports motivation have been good.  Energy affected by MI and beta blockers.  Patient denies any difficulty with concentration.  Patient denies any suicidal ideation. Plan: No med changes Continue Effexor XR 300 and Vyvanse 70  11/26/20 appt with following noted: Best friend killed earlier this year by stepson.   Things calmed down. Nicholas Bonilla with difficulty with eyesight and pt is driving her.  Getting along well without physical intimacy but treat each other well. Lost 15 # withWW. Generally not depressed and anxiety is managed overall.  For awhile stressors interfered with work and now back to work and it's better. vyvanse still working fine.   Tolerating meds.   Anxiety is not interfering with anything and overall good. Plan: No med changes Continue Effexor XR 300 and Vyvanse 70  05/29/21 appt noted: Adderall alone and poor memory and focus, Vyvanse better.   Vyvanse 70 AM with Adderall 15 mg am gives quicker effect.  Tolerated well. Got pt assistance since BCBS not covering. Hx MI 2019 and CHF.  Card ok use of stimulant. Minimal depression now.  Nicholas Bonilla involuntary eye movements and can't drive.  Burdensome. Work is OK with some procrastination on cold calls. Memory concerns; names, events, no one else said anything. Plan: Continue Effexor XR 300 mg daily and Vyvanse 70 mg daily Trial of Namenda per his request for memory.  This can be used off label for ADHD as well  01/13/2022 appointment with the following  noted: Still hit a tired spot in the afternoon with a little brain fog. May split Adderall 15 mg BID with Vyvanse 70 AM Never tried Namenda Trying supplement NeuroQ for brain fog. Wife says he's still forgetful. Alright with mood and anxiety.  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 Satisfied with meds otherwise. Plan: Continue Effexor XR 300 and Vyvanse 70 and Adderalll 15 MG AM PM prn Trial split Effexor 150 BID to see if afternoon sleepiness is better.  07/16/2022 appointment the following noted: No kids so now gkids. Never tried splitting Effexor BC forgot. Tiredness in evening is better. End last year until May year things dramatically better with better productivity and money. Then June Covid sx and then Nicholas Bonilla got it.  He was sick 4 weeks. It caused depression and anxiety.  Now struggles with motivation.   Chronic $ px worse bc missed work a month.  Some conflict with Nicholas Bonilla over using up too much retirement money.  Has forgotten he wasn't going to use the money.  When reminded then he remembers.   Still wants to improve focus memory.     Past Psychiatric Medication Trials:  Adderall, modafinil, Adzenys, Daytrana,  Vyvanse Venlafaxine 300,   Prozac, citalopram,   Review of Systems:  Review of Systems  Constitutional:  Positive for fatigue. Negative for diaphoresis.  Cardiovascular:  Negative for palpitations.  Musculoskeletal:  Positive for arthralgias and back pain. Negative for neck stiffness.  Neurological:  Negative for tremors.  Psychiatric/Behavioral:  Negative for agitation, behavioral problems, confusion, decreased concentration, dysphoric mood, hallucinations, self-injury, sleep disturbance and suicidal ideas. The patient is not nervous/anxious and is not hyperactive.   BP controlled  Medications: I have reviewed the patient's current medications.  Current Outpatient Medications  Medication Sig Dispense Refill    acetaminophen (TYLENOL) 500 MG tablet Take 1,000 mg by mouth 3 (three) times daily.     amphetamine-dextroamphetamine (ADDERALL) 30 MG tablet TAKE 1/2 TABLET BY MOUTH IN THE MORNING AND 3 PM 30 tablet 0   amphetamine-dextroamphetamine (ADDERALL) 30 MG tablet Take 0.5 tablets by mouth 2 (two) times daily. 30 tablet 0   apixaban (ELIQUIS) 5 MG TABS tablet Take 1 tablet (5 mg total) by mouth 2 (two) times daily. 60 tablet 5   aspirin 81 MG chewable tablet Chew 81 mg by mouth daily.     atorvastatin (LIPITOR) 80 MG tablet Take 80 mg by mouth daily.     balsalazide (COLAZAL) 750 MG capsule Take 2,250 mg by mouth 3 (three) times daily.      buPROPion (WELLBUTRIN XL) 150 MG 24 hr tablet 1 in the AM for 1 week then 2 each AM 30 tablet 0   Cholecalciferol (VITAMIN D3) 5000 units CAPS Take 5,000 Units by mouth daily.     Coenzyme Q10 (CO Q-10) 100 MG CAPS Take 100 mg by mouth daily.     DHEA 25 MG CAPS Take by mouth.     ENTRESTO 24-26 MG      metoprolol succinate (TOPROL-XL) 100 MG 24 hr tablet Take 100 mg by mouth daily. Take  with or immediately following a meal.     Multiple Vitamins-Minerals (THERAGRAN-M PREMIER 50 PLUS) TABS Take 1 tablet by mouth daily.     polyethylene glycol (MIRALAX / GLYCOLAX) packet Take 17 g by mouth 2 (two) times daily.     testosterone cypionate (DEPOTESTOSTERONE CYPIONATE) 200 MG/ML injection 0.21ml every 11 days     venlafaxine XR (EFFEXOR-XR) 150 MG 24 hr capsule Take 2 capsules (300 mg total) by mouth daily with breakfast. 180 capsule 1   amphetamine-dextroamphetamine (ADDERALL) 30 MG tablet Take 0.5 tablets by mouth 2 (two) times daily. 30 tablet 0   [START ON 08/13/2022] amphetamine-dextroamphetamine (ADDERALL) 30 MG tablet TAKE 1/2 TABLET BY MOUTH 2 TIMES DAILY 30 tablet 0   [START ON 09/10/2022] amphetamine-dextroamphetamine (ADDERALL) 30 MG tablet TAKE 1/2 TABLET BY MOUTH 2 TIMES DAILY 30 tablet 0   lisdexamfetamine (VYVANSE) 70 MG capsule Take 1 capsule (70 mg total)  by mouth daily. 30 capsule 0   [START ON 08/13/2022] lisdexamfetamine (VYVANSE) 70 MG capsule Take 1 capsule (70 mg total) by mouth daily. 30 capsule 0   [START ON 09/10/2022] lisdexamfetamine (VYVANSE) 70 MG capsule Take 1 capsule (70 mg total) by mouth daily. 30 capsule 0   lisinopril (PRINIVIL,ZESTRIL) 2.5 MG tablet Take 5 mg by mouth daily. (Patient not taking: Reported on 01/13/2022)     Specialty Vitamins Products (PROSTATE PO) Take by mouth. (Patient not taking: Reported on 01/13/2022)     tamsulosin (FLOMAX) 0.4 MG CAPS capsule Take 1 capsule (0.4 mg total) by mouth daily. (Patient not taking: Reported on 01/13/2022) 30 capsule 0   No current facility-administered medications for this visit.    Medication Side Effects: None  Allergies:  Allergies  Allergen Reactions   Codeine Rash and Other (See Comments)    Only in high doses Other reaction(s): Headache    Latex Rash   Naproxen Sodium Other (See Comments)    REACTION: colitis flare   Promethazine Hcl Other (See Comments)    REACTION: mental status changes, memory loss    Past Medical History:  Diagnosis Date   ADD (attention deficit disorder with hyperactivity)    Arthritis    History of blood clots    Left Leg   History of kidney stones    Hypertension    OCD (obsessive compulsive disorder)    Ulcerative colitis     Family History  Problem Relation Age of Onset   Heart attack Father 38   Hypertension Father    Rheumatic fever Father    Sudden death Father    Cancer Sister        Larynx-smoker   Hypertension Sister    Heart attack Maternal Uncle        X2; > 37 yo   Heart attack Paternal Grandfather        > 41   Dementia Mother    Diabetes Neg Hx    Hyperlipidemia Neg Hx     Social History   Socioeconomic History   Marital status: Married    Spouse name: Nicholas Bonilla   Number of children: 0   Years of education: 12+   Highest education Bonilla: Some college, no degree  Occupational History   Not on file   Tobacco Use   Smoking status: Former    Types: Cigarettes    Quit date: 12/14/1984    Years since quitting: 37.6   Smokeless tobacco: Never  Vaping Use   Vaping Use: Never used  Substance and Sexual Activity  Alcohol use: Yes    Comment:  1 wine or  beer/ day or less   Drug use: No   Sexual activity: Not on file  Other Topics Concern   Not on file  Social History Narrative   From [facility]   Married with no children   Occasional drinks   Former smoker quit 1986   Full Code   Social Determinants of Corporate investment banker Strain: Not on file  Food Insecurity: Not on file  Transportation Needs: Not on file  Physical Activity: Not on file  Stress: Not on file  Social Connections: Not on file  Intimate Partner Violence: Not on file    Past Medical History, Surgical history, Social history, and Family history were reviewed and updated as appropriate.   Please see review of systems for further details on the patient's review from today.   Objective:   Physical Exam:  BP 134/82   Pulse 83   Physical Exam Constitutional:      General: He is not in acute distress.    Appearance: He is well-developed.  Musculoskeletal:        General: No deformity.  Neurological:     Mental Status: He is alert and oriented to person, place, and time.     Motor: No tremor.     Coordination: Coordination normal.     Gait: Gait normal.  Psychiatric:        Attention and Perception: Attention and perception normal. He is attentive.        Mood and Affect: Mood is not anxious or depressed. Affect is not labile, blunt, angry or inappropriate.        Speech: Speech normal.        Behavior: Behavior normal.        Thought Content: Thought content normal. Thought content is not delusional. Thought content does not include homicidal or suicidal ideation. Thought content does not include suicidal plan.        Cognition and Memory: Cognition normal. Memory is impaired.         Judgment: Judgment normal.     Comments: Insight intact. No auditory or visual hallucinations. No delusions.  Not real sad but not motivated enough     Lab Review:     Component Value Date/Time   NA 140 07/10/2019 0830   NA 143 08/07/2014 1450   K 4.4 07/10/2019 0830   K 4.0 08/07/2014 1450   CL 103 07/10/2019 0830   CL 107 08/07/2014 1450   CO2 24 07/10/2019 0830   CO2 27 08/07/2014 1450   GLUCOSE 95 07/10/2019 0830   GLUCOSE 102 (H) 08/07/2014 1450   BUN 23 (H) 07/10/2019 0830   BUN 21 (H) 08/07/2014 1450   CREATININE 1.08 07/10/2019 0830   CREATININE 1.20 08/07/2014 1450   CALCIUM 9.5 07/10/2019 0830   CALCIUM 9.0 08/07/2014 1450   PROT 7.1 07/10/2019 0830   PROT 7.0 08/07/2014 1450   ALBUMIN 4.1 07/10/2019 0830   ALBUMIN 3.8 08/07/2014 1450   AST 34 07/10/2019 0830   AST 29 08/07/2014 1450   ALT 30 07/10/2019 0830   ALT 33 08/07/2014 1450   ALKPHOS 79 07/10/2019 0830   ALKPHOS 58 08/07/2014 1450   BILITOT 0.8 07/10/2019 0830   BILITOT 0.4 08/07/2014 1450   GFRNONAA >60 07/10/2019 0830   GFRNONAA >60 08/07/2014 1450   GFRAA >60 07/10/2019 0830   GFRAA >60 08/07/2014 1450       Component  Value Date/Time   WBC 5.8 07/10/2019 0830   RBC 4.84 07/10/2019 0830   HGB 15.5 07/10/2019 0830   HGB 16.6 08/07/2014 1450   HCT 45.3 07/10/2019 0830   HCT 48.3 08/07/2014 1450   PLT 268 07/10/2019 0830   PLT 178 08/07/2014 1450   MCV 93.6 07/10/2019 0830   MCV 91 08/07/2014 1450   MCH 32.0 07/10/2019 0830   MCHC 34.2 07/10/2019 0830   RDW 12.3 07/10/2019 0830   RDW 12.5 08/07/2014 1450   LYMPHSABS 1.7 07/10/2019 0830   LYMPHSABS 1.8 08/07/2014 1450   MONOABS 0.8 07/10/2019 0830   MONOABS 0.5 08/07/2014 1450   EOSABS 0.0 07/10/2019 0830   EOSABS 0.1 08/07/2014 1450   BASOSABS 0.1 07/10/2019 0830   BASOSABS 0.1 08/07/2014 1450    No results found for: "POCLITH", "LITHIUM"   No results found for: "PHENYTOIN", "PHENOBARB", "VALPROATE", "CBMZ"  Obstructive sleep  apnea results discussed in detail he has very mild sleep apnea with an AHI of 8.5.  He could consider CPAP but is not likely to make a huge difference.  We will reserve this option for later. .res Assessment: Plan:    Azel was seen today for follow-up, depression and adhd.  Diagnoses and all orders for this visit:  Attention deficit hyperactivity disorder (ADHD), predominantly inattentive type -     buPROPion (WELLBUTRIN XL) 150 MG 24 hr tablet; 1 in the AM for 1 week then 2 each AM -     amphetamine-dextroamphetamine (ADDERALL) 30 MG tablet; Take 0.5 tablets by mouth 2 (two) times daily. -     amphetamine-dextroamphetamine (ADDERALL) 30 MG tablet; TAKE 1/2 TABLET BY MOUTH 2 TIMES DAILY -     amphetamine-dextroamphetamine (ADDERALL) 30 MG tablet; TAKE 1/2 TABLET BY MOUTH 2 TIMES DAILY -     lisdexamfetamine (VYVANSE) 70 MG capsule; Take 1 capsule (70 mg total) by mouth daily. -     lisdexamfetamine (VYVANSE) 70 MG capsule; Take 1 capsule (70 mg total) by mouth daily. -     lisdexamfetamine (VYVANSE) 70 MG capsule; Take 1 capsule (70 mg total) by mouth daily.  Major depressive disorder, recurrent episode, moderate (HCC) -     buPROPion (WELLBUTRIN XL) 150 MG 24 hr tablet; 1 in the AM for 1 week then 2 each AM  Social anxiety disorder  Mixed obsessional thoughts and acts  Mild obstructive sleep apnea    Nicholas Bonilla has had controlled depression and poor attention and focus. But also memory concerns.    Reports the Vyvanse had a much better mood and conc effect with the Vyvanse even over the modafanil.Has tried Adzenys and Adderall also.    He wants to continue the Vyvanse 70mg .  Disc cardiac effects.   Both his mood and focus are better on the Vyvanse.  Continue Effexor XR 300 and Vyvanse 70 and Adderalll 15 MG AM PM prn Trial split Effexor 150 BID to see if afternoon sleepiness is better. Disc interactions with other supplements meds that might increase NE then he may have trouble  tolerating.  Obstructive sleep apnea results discussed in detail last visist he has very mild sleep apnea with an AHI of 8.5.  He could consider CPAP but is not likely to make a huge difference.  We will reserve this option for later.  I would recommend he share his sleep study results with his cardiologist to see if the cardiologist has any further input.  Cardiologist Dr. also recommended the sleep study on June 24, 2018.  Educated him about the dangers of untreated sleep apnea and and especially in association with his underlying heart disease His sleep apnea was not deemed severe Because of persistent brain fog complaints and cardiac issues recommend he talk with his cardiologist about possibly repeating the sleep study and a lab.  It has been several years since his home sleep study was done.  defer Namenda for memory per his request  Wellbutrin XL 150 to 300 mg AM Disc SE  Asks avout video game [employer] for ADD  Disc the off-label use of N-Acetylcysteine at 600 mg daily to help with mild cognitive problems.  It can be combined with a B-complex vitamin as the B-12 and folate have been shown to sometimes enhance the effect. Disc memory supplement options including lithium.  Disc post article.  He agrees to this plan  FU 8 weeks  Lynder Parents, MD, DFAPA   Please see After Visit Summary for patient specific instructions.  No future appointments.  No orders of the defined types were placed in this encounter.      -------------------------------

## 2022-07-17 NOTE — Telephone Encounter (Signed)
Noted in his chart

## 2022-07-31 ENCOUNTER — Other Ambulatory Visit: Payer: Self-pay | Admitting: Psychiatry

## 2022-07-31 DIAGNOSIS — F900 Attention-deficit hyperactivity disorder, predominantly inattentive type: Secondary | ICD-10-CM

## 2022-07-31 DIAGNOSIS — F331 Major depressive disorder, recurrent, moderate: Secondary | ICD-10-CM

## 2022-09-30 ENCOUNTER — Other Ambulatory Visit: Payer: Self-pay | Admitting: Psychiatry

## 2022-09-30 DIAGNOSIS — F900 Attention-deficit hyperactivity disorder, predominantly inattentive type: Secondary | ICD-10-CM

## 2022-09-30 DIAGNOSIS — F331 Major depressive disorder, recurrent, moderate: Secondary | ICD-10-CM

## 2022-09-30 NOTE — Telephone Encounter (Signed)
Is there a reason he doesn't take 1 of the 300 mg tablets?

## 2022-10-09 ENCOUNTER — Other Ambulatory Visit: Payer: Self-pay | Admitting: Psychiatry

## 2022-10-09 DIAGNOSIS — F900 Attention-deficit hyperactivity disorder, predominantly inattentive type: Secondary | ICD-10-CM

## 2022-10-13 ENCOUNTER — Telehealth (INDEPENDENT_AMBULATORY_CARE_PROVIDER_SITE_OTHER): Payer: BLUE CROSS/BLUE SHIELD | Admitting: Psychiatry

## 2022-10-13 ENCOUNTER — Encounter: Payer: Self-pay | Admitting: Psychiatry

## 2022-10-13 DIAGNOSIS — F422 Mixed obsessional thoughts and acts: Secondary | ICD-10-CM | POA: Diagnosis not present

## 2022-10-13 DIAGNOSIS — F331 Major depressive disorder, recurrent, moderate: Secondary | ICD-10-CM | POA: Diagnosis not present

## 2022-10-13 DIAGNOSIS — F4010 Social phobia, unspecified: Secondary | ICD-10-CM

## 2022-10-13 DIAGNOSIS — F900 Attention-deficit hyperactivity disorder, predominantly inattentive type: Secondary | ICD-10-CM

## 2022-10-13 DIAGNOSIS — G4733 Obstructive sleep apnea (adult) (pediatric): Secondary | ICD-10-CM

## 2022-10-13 MED ORDER — LISDEXAMFETAMINE DIMESYLATE 70 MG PO CAPS: 70.0000 mg | ORAL_CAPSULE | Freq: Every day | ORAL | 0 refills | Status: DC

## 2022-10-13 MED ORDER — AMPHETAMINE-DEXTROAMPHETAMINE 30 MG PO TABS: 15.0000 mg | ORAL_TABLET | Freq: Two times a day (BID) | ORAL | 0 refills | Status: DC

## 2022-10-13 MED ORDER — AMPHETAMINE-DEXTROAMPHETAMINE 30 MG PO TABS: ORAL_TABLET | ORAL | 0 refills | Status: DC

## 2022-10-13 MED ORDER — BUPROPION HCL ER (XL) 150 MG PO TB24: 450.0000 mg | ORAL_TABLET | Freq: Every day | ORAL | 1 refills | Status: DC

## 2022-10-13 NOTE — Progress Notes (Signed)
Nicholas Bonilla [id number] 05/07/1961 61 y.o.  Video Visit via My Chart  I connected with pt by video using My Chart and verified that I am speaking with the correct person using two identifiers.   I discussed the limitations, risks, security and privacy concerns of performing an evaluation and management service by My Chart  and the availability of in person appointments. I also discussed with the patient that there may be a patient responsible charge related to this service. The patient expressed understanding and agreed to proceed.  I discussed the assessment and treatment plan with the patient. The patient was provided an opportunity to ask questions and all were answered. The patient agreed with the plan and demonstrated an understanding of the instructions.   The patient was advised to call back or seek an in-person evaluation if the symptoms worsen or if the condition fails to improve as anticipated.  I provided 30 minutes of video time during this encounter.  The patient was located at home and the provider was located office. Session from 2 PM until 2:30 PM  Subjective:   Patient ID:  Nicholas CruiseRobert O Birchard is a 61 y.o. (DOB 06/03/1961) male.  Chief Complaint:  Chief Complaint  Patient presents with   Follow-up   ADHD   Depression   Fatigue    Depression        Associated symptoms include fatigue.  Associated symptoms include no decreased concentration and no suicidal ideas.  Past medical history includes anxiety.   Anxiety Patient reports no confusion, decreased concentration, nervous/anxious behavior, palpitations or suicidal ideas.     Nicholas Bonilla presents to the office today for follow-up of depression anxiety, ADD and sleep problems.  seen June 2020.  No med changes were made.  Continues Vyvanse 70 mg, Effexor 225 mg daily.  December 2020 appointment the following was noted: Much better.  Sepsis July and kidney stone. Hx MI.  Better now.  More back to normal.  While  hospitalized was on Effexor 150 but back 225 Effexor and it works well. Overall well except some expected bouts of loneliness and depression.  Biggest outlet has been church and Covid interfered..  Work is better and better confidence.   Patient reports stable mood and denies depressed or irritable moods.  Patient denies any recent difficulty with anxiety.  Patient denies difficulty with sleep initiation or maintenance, sleep to bed 10:30 and start waking about 8 hour.  Intermittent cathing necessary.  Not napping.. Denies appetite disturbance.  Patient reports that energy and motivation have been good.  Patient denies any difficulty with concentration.  Patient denies any suicidal ideation. Last year restarted Vyvanse and it's working very well but it's $216 month.  Pleased with it.  Adderall less effective. Really needs the stimulant for attention and focus.  Has returned to work.  Vyvanse added it and mood was much improved and clear-headed. No meds were changed  04/25/20 TC increase Effexor to 300 mg daily.  05/27/2020 appointment with the following noted: Increase Effexor helped a good deal and glad he did so. Tolerating both meds fine.  Except brief period of sleepiness in the morning for a few mins and needs to lie down.  Not always.  Is also a sense of cloudiness needing a short nap. Patient reports stable mood and denies depressed or irritable moods.  Patient denies any recent difficulty with anxiety.  Patient denies difficulty with sleep initiation or maintenance. Denies appetite disturbance.  Patient reports motivation have been  good.  Energy affected by MI and beta blockers.  Patient denies any difficulty with concentration.  Patient denies any suicidal ideation. Plan: No med changes Continue Effexor XR 300 and Vyvanse 70  11/26/20 appt with following noted: Best friend killed earlier this year by stepson.   Things calmed down. Nicholas Bonilla with difficulty with eyesight and pt is driving her.   Getting along well without physical intimacy but treat each other well. Lost 15 # withWW. Generally not depressed and anxiety is managed overall.  For awhile stressors interfered with work and now back to work and it's better. vyvanse still working fine.   Tolerating meds.   Anxiety is not interfering with anything and overall good. Plan: No med changes Continue Effexor XR 300 and Vyvanse 70  05/29/21 appt noted: Adderall alone and poor memory and focus, Vyvanse better.   Vyvanse 70 AM with Adderall 15 mg am gives quicker effect.  Tolerated well. Got pt assistance since BCBS not covering. Hx MI 2019 and CHF.  Card ok use of stimulant. Minimal depression now.  [name] involuntary eye movements and can't drive.  Burdensome. Work is OK with some procrastination on cold calls. Memory concerns; names, events, no one else said anything. Plan: Continue Effexor XR 300 mg daily and Vyvanse 70 mg daily Trial of Namenda per his request for memory.  This can be used off label for ADHD as well  01/13/2022 appointment with the following noted: Still hit a tired spot in the afternoon with a little brain fog. May split Adderall 15 mg BID with Vyvanse 70 AM Never tried Namenda Trying supplement NeuroQ for brain fog. Wife says he's still forgetful. Alright with mood and anxiety.  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 Satisfied with meds otherwise. Plan: Continue Effexor XR 300 and Vyvanse 70 and Adderalll 15 MG AM PM prn Trial split Effexor 150 BID to see if afternoon sleepiness is better.  07/16/2022 appointment the following noted: No kids so now gkids. Never tried splitting Effexor BC forgot. Tiredness in evening is better. End last year until May year things dramatically better with better productivity and money. Then June Covid sx and then Nicholas Bonilla got it.  He was sick 4 weeks. It caused depression and anxiety.  Now struggles with motivation.   Chronic $  px worse bc missed work a month.  Some conflict with Nicholas Bonilla over using up too much retirement money.  Has forgotten he wasn't going to use the money.  When reminded then he remembers.   Still wants to improve focus memory.   Plan: Wellbutrin XL 150 to 300 mg AM added to Effexor 150 BID  10/13/2022 appointment noted: Increased Wellbutrin 300 mg and initially really good with more postive outlook and feeling better about himself.  Also not as sleepy.  but now tiredness again.  Mood still a little better.  More sleepy than tired.  Can work through it  generally.  Less avoidant than he was. Due for testosterone which usually helps a good bit. Insurance covering meds well now with high ded policy. No SE Sleep is good and dep.   Past Psychiatric Medication Trials:  Adderall, modafinil, Adzenys, Daytrana,  Vyvanse Venlafaxine 300,   Prozac, citalopram, Wellbutrin 300  Review of Systems:  Review of Systems  Constitutional:  Positive for fatigue. Negative for diaphoresis.  Cardiovascular:  Negative for palpitations.  Musculoskeletal:  Positive for arthralgias and back pain. Negative for neck stiffness.  Neurological:  Negative  for tremors.  Psychiatric/Behavioral:  Negative for agitation, behavioral problems, confusion, decreased concentration, dysphoric mood, hallucinations, self-injury, sleep disturbance and suicidal ideas. The patient is not nervous/anxious and is not hyperactive.   BP controlled  Medications: I have reviewed the patient's current medications.  Current Outpatient Medications  Medication Sig Dispense Refill   acetaminophen (TYLENOL) 500 MG tablet Take 1,000 mg by mouth 3 (three) times daily.     apixaban (ELIQUIS) 5 MG TABS tablet Take 1 tablet (5 mg total) by mouth 2 (two) times daily. 60 tablet 5   aspirin 81 MG chewable tablet Chew 81 mg by mouth daily.     atorvastatin (LIPITOR) 80 MG tablet Take 80 mg by mouth daily.     balsalazide (COLAZAL) 750 MG capsule Take 2,250  mg by mouth 3 (three) times daily.      Cholecalciferol (VITAMIN D3) 5000 units CAPS Take 5,000 Units by mouth daily.     DHEA 25 MG CAPS Take by mouth.     ENTRESTO 24-26 MG      metoprolol succinate (TOPROL-XL) 100 MG 24 hr tablet Take 100 mg by mouth daily. Take with or immediately following a meal.     Multiple Vitamins-Minerals (THERAGRAN-M PREMIER 50 PLUS) TABS Take 1 tablet by mouth daily.     polyethylene glycol (MIRALAX / GLYCOLAX) packet Take 17 g by mouth 2 (two) times daily.     Specialty Vitamins Products (PROSTATE PO) Take by mouth.     tamsulosin (FLOMAX) 0.4 MG CAPS capsule Take 1 capsule (0.4 mg total) by mouth daily. 30 capsule 0   testosterone cypionate (DEPOTESTOSTERONE CYPIONATE) 200 MG/ML injection 0.45ml every 11 days     venlafaxine XR (EFFEXOR-XR) 150 MG 24 hr capsule Take 2 capsules (300 mg total) by mouth daily with breakfast. 180 capsule 1   amphetamine-dextroamphetamine (ADDERALL) 30 MG tablet Take 0.5 tablets by mouth 2 (two) times daily. 30 tablet 0   [START ON 11/10/2022] amphetamine-dextroamphetamine (ADDERALL) 30 MG tablet TAKE 1/2 TABLET BY MOUTH IN THE MORNING AND 3 PM 30 tablet 0   [START ON 12/08/2022] amphetamine-dextroamphetamine (ADDERALL) 30 MG tablet Take 0.5 tablets by mouth 2 (two) times daily. 30 tablet 0   buPROPion (WELLBUTRIN XL) 150 MG 24 hr tablet Take 3 tablets (450 mg total) by mouth daily. 90 tablet 1   Coenzyme Q10 (CO Q-10) 100 MG CAPS Take 100 mg by mouth daily. (Patient not taking: Reported on 10/13/2022)     [START ON 11/10/2022] lisdexamfetamine (VYVANSE) 70 MG capsule Take 1 capsule (70 mg total) by mouth daily. 30 capsule 0   lisdexamfetamine (VYVANSE) 70 MG capsule Take 1 capsule (70 mg total) by mouth daily. 30 capsule 0   [START ON 12/08/2022] lisdexamfetamine (VYVANSE) 70 MG capsule Take 1 capsule (70 mg total) by mouth daily. 30 capsule 0   lisinopril (PRINIVIL,ZESTRIL) 2.5 MG tablet Take 5 mg by mouth daily. (Patient not taking:  Reported on 01/13/2022)     No current facility-administered medications for this visit.    Medication Side Effects: None  Allergies:  Allergies  Allergen Reactions   Codeine Rash and Other (See Comments)    Only in high doses Other reaction(s): Headache    Latex Rash   Naproxen Sodium Other (See Comments)    REACTION: colitis flare   Promethazine Hcl Other (See Comments)    REACTION: mental status changes, memory loss    Past Medical History:  Diagnosis Date   ADD (attention deficit disorder with hyperactivity)  Arthritis    History of blood clots    Left Leg   History of kidney stones    Hypertension    OCD (obsessive compulsive disorder)    Ulcerative colitis     Family History  Problem Relation Age of Onset   Heart attack Father 47   Hypertension Father    Rheumatic fever Father    Sudden death Father    Cancer Sister        Larynx-smoker   Hypertension Sister    Heart attack Maternal Uncle        X2; > 67 yo   Heart attack Paternal Grandfather        > 41   Dementia Mother    Diabetes Neg Hx    Hyperlipidemia Neg Hx     Social History   Socioeconomic History   Marital status: Married    Spouse name: Nicholas Bonilla   Number of children: 0   Years of education: 12+   Highest education level: Some college, no degree  Occupational History   Not on file  Tobacco Use   Smoking status: Former    Types: Cigarettes    Quit date: 12/14/1984    Years since quitting: 37.8   Smokeless tobacco: Never  Vaping Use   Vaping Use: Never used  Substance and Sexual Activity   Alcohol use: Yes    Comment:  1 wine or  beer/ day or less   Drug use: No   Sexual activity: Not on file  Other Topics Concern   Not on file  Social History Narrative   From [place]   Married with no children   Occasional drinks   Former smoker quit 1986   Full Code   Social Determinants of Corporate investment banker Strain: Not on file  Food Insecurity: Not on file   Transportation Needs: Not on file  Physical Activity: Not on file  Stress: Not on file  Social Connections: Not on file  Intimate Partner Violence: Not on file    Past Medical History, Surgical history, Social history, and Family history were reviewed and updated as appropriate.   Please see review of systems for further details on the patient's review from today.   Objective:   Physical Exam:  There were no vitals taken for this visit.  Physical Exam Constitutional:      General: He is not in acute distress.    Appearance: He is well-developed.  Musculoskeletal:        General: No deformity.  Neurological:     Mental Status: He is alert and oriented to person, place, and time.     Motor: No tremor.     Coordination: Coordination normal.     Gait: Gait normal.  Psychiatric:        Attention and Perception: Attention and perception normal. He is attentive.        Mood and Affect: Mood is not anxious or depressed. Affect is not labile, blunt, angry or inappropriate.        Speech: Speech normal.        Behavior: Behavior normal.        Thought Content: Thought content normal. Thought content is not delusional. Thought content does not include homicidal or suicidal ideation. Thought content does not include suicidal plan.        Cognition and Memory: Cognition normal. Memory is impaired.        Judgment: Judgment normal.  Comments: Insight intact. No auditory or visual hallucinations. No delusions.  Not real sad but not motivated enough     Lab Review:     Component Value Date/Time   NA 140 07/10/2019 0830   NA 143 08/07/2014 1450   K 4.4 07/10/2019 0830   K 4.0 08/07/2014 1450   CL 103 07/10/2019 0830   CL 107 08/07/2014 1450   CO2 24 07/10/2019 0830   CO2 27 08/07/2014 1450   GLUCOSE 95 07/10/2019 0830   GLUCOSE 102 (H) 08/07/2014 1450   BUN 23 (H) 07/10/2019 0830   BUN 21 (H) 08/07/2014 1450   CREATININE 1.08 07/10/2019 0830   CREATININE 1.20 08/07/2014  1450   CALCIUM 9.5 07/10/2019 0830   CALCIUM 9.0 08/07/2014 1450   PROT 7.1 07/10/2019 0830   PROT 7.0 08/07/2014 1450   ALBUMIN 4.1 07/10/2019 0830   ALBUMIN 3.8 08/07/2014 1450   AST 34 07/10/2019 0830   AST 29 08/07/2014 1450   ALT 30 07/10/2019 0830   ALT 33 08/07/2014 1450   ALKPHOS 79 07/10/2019 0830   ALKPHOS 58 08/07/2014 1450   BILITOT 0.8 07/10/2019 0830   BILITOT 0.4 08/07/2014 1450   GFRNONAA >60 07/10/2019 0830   GFRNONAA >60 08/07/2014 1450   GFRAA >60 07/10/2019 0830   GFRAA >60 08/07/2014 1450       Component Value Date/Time   WBC 5.8 07/10/2019 0830   RBC 4.84 07/10/2019 0830   HGB 15.5 07/10/2019 0830   HGB 16.6 08/07/2014 1450   HCT 45.3 07/10/2019 0830   HCT 48.3 08/07/2014 1450   PLT 268 07/10/2019 0830   PLT 178 08/07/2014 1450   MCV 93.6 07/10/2019 0830   MCV 91 08/07/2014 1450   MCH 32.0 07/10/2019 0830   MCHC 34.2 07/10/2019 0830   RDW 12.3 07/10/2019 0830   RDW 12.5 08/07/2014 1450   LYMPHSABS 1.7 07/10/2019 0830   LYMPHSABS 1.8 08/07/2014 1450   MONOABS 0.8 07/10/2019 0830   MONOABS 0.5 08/07/2014 1450   EOSABS 0.0 07/10/2019 0830   EOSABS 0.1 08/07/2014 1450   BASOSABS 0.1 07/10/2019 0830   BASOSABS 0.1 08/07/2014 1450    No results found for: "POCLITH", "LITHIUM"   No results found for: "PHENYTOIN", "PHENOBARB", "VALPROATE", "CBMZ"  Obstructive sleep apnea results discussed in detail he has very mild sleep apnea with an AHI of 8.5.  He could consider CPAP but is not likely to make a huge difference.  We will reserve this option for later. .res Assessment: Plan:    Aviraj was seen today for follow-up, adhd, depression and fatigue.  Diagnoses and all orders for this visit:  Major depressive disorder, recurrent episode, moderate (HCC) -     buPROPion (WELLBUTRIN XL) 150 MG 24 hr tablet; Take 3 tablets (450 mg total) by mouth daily.  Social anxiety disorder  Mixed obsessional thoughts and acts  Attention deficit hyperactivity  disorder (ADHD), predominantly inattentive type -     buPROPion (WELLBUTRIN XL) 150 MG 24 hr tablet; Take 3 tablets (450 mg total) by mouth daily. -     amphetamine-dextroamphetamine (ADDERALL) 30 MG tablet; Take 0.5 tablets by mouth 2 (two) times daily. -     amphetamine-dextroamphetamine (ADDERALL) 30 MG tablet; TAKE 1/2 TABLET BY MOUTH IN THE MORNING AND 3 PM -     amphetamine-dextroamphetamine (ADDERALL) 30 MG tablet; Take 0.5 tablets by mouth 2 (two) times daily. -     lisdexamfetamine (VYVANSE) 70 MG capsule; Take 1 capsule (70 mg total) by  mouth daily. -     lisdexamfetamine (VYVANSE) 70 MG capsule; Take 1 capsule (70 mg total) by mouth daily. -     lisdexamfetamine (VYVANSE) 70 MG capsule; Take 1 capsule (70 mg total) by mouth daily.  Mild obstructive sleep apnea   Greater than 50% of 30 min video face to face time with patient was spent on counseling and coordination of care. We discussed [name] has had controlled depression and poor attention and focus. But also memory concerns.  With chronic fatigue .  Is also working on adjusting testosterone to help.  Reports the Vyvanse had a much better mood and conc effect with the Vyvanse even over the modafanil.Has tried Adzenys and Adderall also.    He wants to continue the Vyvanse .  Disc cardiac effects.   Both his mood and focus are better on the Vyvanse.  Continue Effexor XR 300 and Vyvanse 70 and Adderalll 15 MG AM PM prn split Effexor 150 BID to see if afternoon sleepiness is better. Disc interactions with other supplements meds that might increase NE then he may have trouble tolerating.  Obstructive sleep apnea results discussed in detail last visist he has very mild sleep apnea with an AHI of 8.5.  He could consider CPAP but is not likely to make a huge difference.  We will reserve this option for later.  I would recommend he share his sleep study results with his cardiologist to see if the cardiologist has any further input.   Cardiologist Dr. Corena Herter also recommended the sleep study on June 24, 2018.  Educated him about the dangers of untreated sleep apnea and and especially in association with his underlying heart disease His sleep apnea was not deemed severe Because of persistent brain fog complaints and cardiac issues recommend he talk with his cardiologist about possibly repeating the sleep study and a lab.  It has been several years since his home sleep study was done.  defer Namenda for memory per his request  Increase Wellbutrin XL to 450 mg AM Disc SE  Disc the off-label use of N-Acetylcysteine at 600 mg daily to help with mild cognitive problems.  It can be combined with a B-complex vitamin as the B-12 and folate have been shown to sometimes enhance the effect. Disc memory supplement options including lithium.  Disc post article.  He agrees to this plan  FU 12 weeks  Meredith Staggers, MD, DFAPA   Please see After Visit Summary for patient specific instructions.  No future appointments.  No orders of the defined types were placed in this encounter.      -------------------------------

## 2022-11-06 ENCOUNTER — Other Ambulatory Visit: Payer: Self-pay | Admitting: Psychiatry

## 2022-11-06 DIAGNOSIS — F422 Mixed obsessional thoughts and acts: Secondary | ICD-10-CM

## 2022-11-06 DIAGNOSIS — F331 Major depressive disorder, recurrent, moderate: Secondary | ICD-10-CM

## 2022-11-06 DIAGNOSIS — F4010 Social phobia, unspecified: Secondary | ICD-10-CM

## 2022-11-10 ENCOUNTER — Other Ambulatory Visit: Payer: Self-pay

## 2022-11-10 DIAGNOSIS — F331 Major depressive disorder, recurrent, moderate: Secondary | ICD-10-CM

## 2022-11-10 DIAGNOSIS — F4010 Social phobia, unspecified: Secondary | ICD-10-CM

## 2022-11-10 DIAGNOSIS — F422 Mixed obsessional thoughts and acts: Secondary | ICD-10-CM

## 2022-11-10 MED ORDER — VENLAFAXINE HCL ER 150 MG PO CP24: 300.0000 mg | ORAL_CAPSULE | Freq: Every day | ORAL | 0 refills | Status: DC

## 2022-11-13 ENCOUNTER — Telehealth: Payer: Self-pay

## 2022-11-13 NOTE — Telephone Encounter (Signed)
Prior Approval received effective 11/13/2022-11/12/2023, for Venlafaxine ER 150 mg capsules 2/day

## 2022-11-13 NOTE — Telephone Encounter (Signed)
Prior Authorization submitted for Venlafaxine ER 150 mg capsules #180/90 day with [employer], pending response.

## 2023-01-04 ENCOUNTER — Other Ambulatory Visit: Payer: Self-pay | Admitting: Psychiatry

## 2023-01-04 DIAGNOSIS — F331 Major depressive disorder, recurrent, moderate: Secondary | ICD-10-CM

## 2023-01-04 DIAGNOSIS — F900 Attention-deficit hyperactivity disorder, predominantly inattentive type: Secondary | ICD-10-CM

## 2023-01-20 ENCOUNTER — Encounter: Payer: Self-pay | Admitting: Psychiatry

## 2023-01-20 ENCOUNTER — Ambulatory Visit (INDEPENDENT_AMBULATORY_CARE_PROVIDER_SITE_OTHER): Payer: BLUE CROSS/BLUE SHIELD | Admitting: Psychiatry

## 2023-01-20 DIAGNOSIS — G4733 Obstructive sleep apnea (adult) (pediatric): Secondary | ICD-10-CM

## 2023-01-20 DIAGNOSIS — F422 Mixed obsessional thoughts and acts: Secondary | ICD-10-CM

## 2023-01-20 DIAGNOSIS — F331 Major depressive disorder, recurrent, moderate: Secondary | ICD-10-CM

## 2023-01-20 DIAGNOSIS — F4010 Social phobia, unspecified: Secondary | ICD-10-CM | POA: Diagnosis not present

## 2023-01-20 DIAGNOSIS — F900 Attention-deficit hyperactivity disorder, predominantly inattentive type: Secondary | ICD-10-CM

## 2023-01-20 MED ORDER — BUPROPION HCL ER (XL) 150 MG PO TB24: 450.0000 mg | ORAL_TABLET | Freq: Every day | ORAL | 1 refills | Status: DC

## 2023-01-20 MED ORDER — AMPHETAMINE-DEXTROAMPHETAMINE 30 MG PO TABS: 15.0000 mg | ORAL_TABLET | Freq: Two times a day (BID) | ORAL | 0 refills | Status: DC

## 2023-01-20 MED ORDER — VENLAFAXINE HCL ER 150 MG PO CP24: 300.0000 mg | ORAL_CAPSULE | Freq: Every day | ORAL | 1 refills | Status: DC

## 2023-01-20 MED ORDER — AMPHETAMINE-DEXTROAMPHETAMINE 30 MG PO TABS: ORAL_TABLET | ORAL | 0 refills | Status: DC

## 2023-01-20 MED ORDER — LISDEXAMFETAMINE DIMESYLATE 70 MG PO CAPS: 70.0000 mg | ORAL_CAPSULE | Freq: Every day | ORAL | 0 refills | Status: DC

## 2023-01-20 NOTE — Progress Notes (Signed)
Nicholas Bonilla [id number] [birth year] 62 y.o.  Virtual Visit via Telephone Note  I connected with pt by telephone and verified that I am speaking with the correct person using two identifiers.   I discussed the limitations, risks, security and privacy concerns of performing an evaluation and management service by telephone and the availability of in person appointments. I also discussed with the patient that there may be a patient responsible charge related to this service. The patient expressed understanding and agreed to proceed.  I discussed the assessment and treatment plan with the patient. The patient was provided an opportunity to ask questions and all were answered. The patient agreed with the plan and demonstrated an understanding of the instructions.   The patient was advised to call back or seek an in-person evaluation if the symptoms worsen or if the condition fails to improve as anticipated.  I provided 30 minutes of non-face-to-face time during this encounter. The call started at 300 and ended at 330. The patient was located at work and the provider was located office.  Session from 3 PM until 330  Subjective:   Patient ID:  Nicholas Bonilla is a 62 y.o. (DOB 1961/06/21) male.  Chief Complaint:  Chief Complaint  Patient presents with   Follow-up   ADHD   Depression    Depression        Associated symptoms include fatigue.  Associated symptoms include no decreased concentration and no suicidal ideas.  Past medical history includes anxiety.   Anxiety Patient reports no confusion, decreased concentration, nervous/anxious behavior, palpitations or suicidal ideas.     Nicholas Bonilla presents to the office today for follow-up of depression anxiety, ADD and sleep problems.  seen June 2020.  No med changes were made.  Continues Vyvanse 70 mg, Effexor 225 mg daily.  December 2020 appointment the following was noted: Much better.  Sepsis July and kidney stone. Hx MI.  Better  now.  More back to normal.  While hospitalized was on Effexor 150 but back 225 Effexor and it works well. Overall well except some expected bouts of loneliness and depression.  Biggest outlet has been church and Covid interfered..  Work is better and better confidence.   Patient reports stable mood and denies depressed or irritable moods.  Patient denies any recent difficulty with anxiety.  Patient denies difficulty with sleep initiation or maintenance, sleep to bed 10:30 and start waking about 8 hour.  Intermittent cathing necessary.  Not napping.. Denies appetite disturbance.  Patient reports that energy and motivation have been good.  Patient denies any difficulty with concentration.  Patient denies any suicidal ideation. Last year restarted Vyvanse and it's working very well but it's $216 month.  Pleased with it.  Adderall less effective. Really needs the stimulant for attention and focus.  Has returned to work.  Vyvanse added it and mood was much improved and clear-headed. No meds were changed  04/25/20 TC increase Effexor to 300 mg daily.  05/27/2020 appointment with the following noted: Increase Effexor helped a good deal and glad he did so. Tolerating both meds fine.  Except brief period of sleepiness in the morning for a few mins and needs to lie down.  Not always.  Is also a sense of cloudiness needing a short nap. Patient reports stable mood and denies depressed or irritable moods.  Patient denies any recent difficulty with anxiety.  Patient denies difficulty with sleep initiation or maintenance. Denies appetite disturbance.  Patient reports motivation have been  good.  Energy affected by MI and beta blockers.  Patient denies any difficulty with concentration.  Patient denies any suicidal ideation. Plan: No med changes Continue Effexor XR 300 and Vyvanse 70  11/26/20 appt with following noted: Best friend killed earlier this year by stepson.   Things calmed down. Nicholas Bonilla with difficulty  with eyesight and pt is driving her.  Getting along well without physical intimacy but treat each other well. Lost 15 # withWW. Generally not depressed and anxiety is managed overall.  For awhile stressors interfered with work and now back to work and it's better. vyvanse still working fine.   Tolerating meds.   Anxiety is not interfering with anything and overall good. Plan: No med changes Continue Effexor XR 300 and Vyvanse 70  05/29/21 appt noted: Adderall alone and poor memory and focus, Vyvanse better.   Vyvanse 70 AM with Adderall 15 mg am gives quicker effect.  Tolerated well. Got pt assistance since BCBS not covering. Hx MI 2019 and CHF.  Card ok use of stimulant. Minimal depression now.  Nicholas Bonilla involuntary eye movements and can't drive.  Burdensome. Work is OK with some procrastination on cold calls. Memory concerns; names, events, no one else said anything. Plan: Continue Effexor XR 300 mg daily and Vyvanse 70 mg daily Trial of Namenda per his request for memory.  This can be used off label for ADHD as well  01/13/2022 appointment with the following noted: Still hit a tired spot in the afternoon with a little brain fog. May split Adderall 15 mg BID with Vyvanse 70 AM Never tried Namenda Trying supplement NeuroQ for brain fog. Nicholas Bonilla says he's still forgetful. Alright with mood and anxiety.  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 Satisfied with meds otherwise. Plan: Continue Effexor XR 300 and Vyvanse 70 and Adderalll 15 MG AM PM prn Trial split Effexor 150 BID to see if afternoon sleepiness is better.  07/16/2022 appointment the following noted: No kids so now gkids. Never tried splitting Effexor BC forgot. Tiredness in evening is better. End last year until May year things dramatically better with better productivity and money. Then June Covid sx and then Nicholas Bonilla got it.  He was sick 4 weeks. It caused depression and anxiety.  Now  struggles with motivation.   Chronic $ px worse bc missed work a month.  Some conflict with Nicholas Bonilla over using up too much retirement money.  Has forgotten he wasn't going to use the money.  When reminded then he remembers.   Still wants to improve focus memory.   Plan: Wellbutrin XL 150 to 300 mg AM added to Effexor 150 BID  10/13/2022 appointment noted: Increased Wellbutrin 300 mg and initially really good with more postive outlook and feeling better about himself.  Also not as sleepy.  but now tiredness again.  Mood still a little better.  More sleepy than tired.  Can work through it  generally.  Less avoidant than he was. Due for testosterone which usually helps a good bit. Insurance covering meds well now with high ded policy. No SE Sleep is good and dep. Plan: Increase Wellbutrin XL to 450 mg AM  01/20/2023 appointment noted: Not feeling depressed.  Initiaally saw more effect with increase Wellbutrin , more upbeat and smilling but baseline now. Interest Bonilla is better.  Can stay more focused with work.  Still problems with memory including word-finding.  Will lose thoughts in conversation.  Nicholas Bonilla notices his problems too.  Sleep is still good.  Not much anxiety generally. Usually take Adderall 15 mg BID with Vyvanse 70 mg AM & Effexor XR 300 mg daily with Wellbutrin XL 450 AM. No SE unless a little brief dizziness ? Relation.    Past Psychiatric Medication Trials:  Adderall, modafinil, Adzenys, Daytrana,  Vyvanse Venlafaxine 300,   Prozac, citalopram, Wellbutrin 450  Review of Systems:  Review of Systems  Constitutional:  Positive for fatigue. Negative for diaphoresis.  Cardiovascular:  Negative for palpitations.  Musculoskeletal:  Positive for arthralgias and back pain. Negative for neck stiffness.  Neurological:  Negative for tremors.  Psychiatric/Behavioral:  Negative for agitation, behavioral problems, confusion, decreased concentration, dysphoric mood, hallucinations,  self-injury, sleep disturbance and suicidal ideas. The patient is not nervous/anxious and is not hyperactive.   BP controlled  Medications: I have reviewed the patient's current medications.  Current Outpatient Medications  Medication Sig Dispense Refill   acetaminophen (TYLENOL) 500 MG tablet Take 1,000 mg by mouth 3 (three) times daily.     apixaban (ELIQUIS) 5 MG TABS tablet Take 1 tablet (5 mg total) by mouth 2 (two) times daily. 60 tablet 5   aspirin 81 MG chewable tablet Chew 81 mg by mouth daily.     atorvastatin (LIPITOR) 80 MG tablet Take 80 mg by mouth daily.     balsalazide (COLAZAL) 750 MG capsule Take 2,250 mg by mouth 3 (three) times daily.      Cholecalciferol (VITAMIN D3) 5000 units CAPS Take 5,000 Units by mouth daily.     DHEA 25 MG CAPS Take by mouth.     ENTRESTO 24-26 MG      lisinopril (PRINIVIL,ZESTRIL) 2.5 MG tablet Take 5 mg by mouth daily.     metoprolol succinate (TOPROL-XL) 100 MG 24 hr tablet Take 100 mg by mouth daily. Take with or immediately following a meal.     Multiple Vitamins-Minerals (THERAGRAN-M PREMIER 50 PLUS) TABS Take 1 tablet by mouth daily.     polyethylene glycol (MIRALAX / GLYCOLAX) packet Take 17 g by mouth 2 (two) times daily.     Specialty Vitamins Products (PROSTATE PO) Take by mouth.     tamsulosin (FLOMAX) 0.4 MG CAPS capsule Take 1 capsule (0.4 mg total) by mouth daily. 30 capsule 0   testosterone cypionate (DEPOTESTOSTERONE CYPIONATE) 200 MG/ML injection 0.56ml every 11 days     amphetamine-dextroamphetamine (ADDERALL) 30 MG tablet TAKE 1/2 TABLET BY MOUTH IN THE MORNING AND 3 PM 30 tablet 0   [START ON 02/17/2023] amphetamine-dextroamphetamine (ADDERALL) 30 MG tablet Take 0.5 tablets by mouth 2 (two) times daily. 30 tablet 0   [START ON 03/17/2023] amphetamine-dextroamphetamine (ADDERALL) 30 MG tablet Take 0.5 tablets by mouth 2 (two) times daily. 30 tablet 0   buPROPion (WELLBUTRIN XL) 150 MG 24 hr tablet Take 3 tablets (450 mg total) by  mouth daily. 270 tablet 1   Coenzyme Q10 (CO Q-10) 100 MG CAPS Take 100 mg by mouth daily. (Patient not taking: Reported on 10/13/2022)     lisdexamfetamine (VYVANSE) 70 MG capsule Take 1 capsule (70 mg total) by mouth daily. 30 capsule 0   [START ON 02/17/2023] lisdexamfetamine (VYVANSE) 70 MG capsule Take 1 capsule (70 mg total) by mouth daily. 30 capsule 0   [START ON 03/17/2023] lisdexamfetamine (VYVANSE) 70 MG capsule Take 1 capsule (70 mg total) by mouth daily. 30 capsule 0   venlafaxine XR (EFFEXOR-XR) 150 MG 24 hr capsule Take 2 capsules (300 mg total) by mouth daily with breakfast. 180  capsule 1   No current facility-administered medications for this visit.    Medication Side Effects: None  Allergies:  Allergies  Allergen Reactions   Codeine Rash and Other (See Comments)    Only in high doses Other reaction(s): Headache    Latex Rash   Naproxen Sodium Other (See Comments)    REACTION: colitis flare   Promethazine Hcl Other (See Comments)    REACTION: mental status changes, memory loss    Past Medical History:  Diagnosis Date   ADD (attention deficit disorder with hyperactivity)    Arthritis    History of blood clots    Left Leg   History of kidney stones    Hypertension    OCD (obsessive compulsive disorder)    Ulcerative colitis     Family History  Problem Relation Age of Onset   Heart attack Father 56   Hypertension Father    Rheumatic fever Father    Sudden death Father    Cancer Sister        Larynx-smoker   Hypertension Sister    Heart attack Maternal Uncle        X2; > 36 yo   Heart attack Paternal Grandfather        > 82   Dementia Mother    Diabetes Neg Hx    Hyperlipidemia Neg Hx     Social History   Socioeconomic History   Marital status: Married    Spouse name: Nicholas Bonilla   Number of children: 0   Years of education: 12+   Highest education Bonilla: Some college, no degree  Occupational History   Not on file  Tobacco Use   Smoking status:  Former    Types: Cigarettes    Quit date: 12/14/1984    Years since quitting: 38.1   Smokeless tobacco: Never  Vaping Use   Vaping Use: Never used  Substance and Sexual Activity   Alcohol use: Yes    Comment:  1 wine or  beer/ day or less   Drug use: No   Sexual activity: Not on file  Other Topics Concern   Not on file  Social History Narrative   From [place]   Married with no children   Occasional drinks   Former smoker quit 1986   Full Code   Social Determinants of Radio broadcast assistant Strain: Not on file  Food Insecurity: Not on file  Transportation Needs: Not on file  Physical Activity: Not on file  Stress: Not on file  Social Connections: Not on file  Intimate Partner Violence: Not on file    Past Medical History, Surgical history, Social history, and Family history were reviewed and updated as appropriate.   Please see review of systems for further details on the patient's review from today.   Objective:   Physical Exam:  There were no vitals taken for this visit.  Physical Exam Constitutional:      General: He is not in acute distress.    Appearance: He is well-developed.  Musculoskeletal:        General: No deformity.  Neurological:     Mental Status: He is alert and oriented to person, place, and time.     Motor: No tremor.     Coordination: Coordination normal.     Gait: Gait normal.  Psychiatric:        Attention and Perception: Attention and perception normal. He is attentive.        Mood and  Affect: Mood is not anxious or depressed. Affect is not labile, blunt, angry or inappropriate.        Speech: Speech normal.        Behavior: Behavior normal.        Thought Content: Thought content normal. Thought content is not delusional. Thought content does not include homicidal or suicidal ideation. Thought content does not include suicidal plan.        Cognition and Memory: Cognition normal. Memory is impaired.        Judgment: Judgment  normal.     Comments: Insight intact. No auditory or visual hallucinations. No delusions.  Not real sad but not motivated enough     Lab Review:     Component Value Date/Time   NA 140 07/10/2019 0830   NA 143 08/07/2014 1450   K 4.4 07/10/2019 0830   K 4.0 08/07/2014 1450   CL 103 07/10/2019 0830   CL 107 08/07/2014 1450   CO2 24 07/10/2019 0830   CO2 27 08/07/2014 1450   GLUCOSE 95 07/10/2019 0830   GLUCOSE 102 (H) 08/07/2014 1450   BUN 23 (H) 07/10/2019 0830   BUN 21 (H) 08/07/2014 1450   CREATININE 1.08 07/10/2019 0830   CREATININE 1.20 08/07/2014 1450   CALCIUM 9.5 07/10/2019 0830   CALCIUM 9.0 08/07/2014 1450   PROT 7.1 07/10/2019 0830   PROT 7.0 08/07/2014 1450   ALBUMIN 4.1 07/10/2019 0830   ALBUMIN 3.8 08/07/2014 1450   AST 34 07/10/2019 0830   AST 29 08/07/2014 1450   ALT 30 07/10/2019 0830   ALT 33 08/07/2014 1450   ALKPHOS 79 07/10/2019 0830   ALKPHOS 58 08/07/2014 1450   BILITOT 0.8 07/10/2019 0830   BILITOT 0.4 08/07/2014 1450   GFRNONAA >60 07/10/2019 0830   GFRNONAA >60 08/07/2014 1450   GFRAA >60 07/10/2019 0830   GFRAA >60 08/07/2014 1450       Component Value Date/Time   WBC 5.8 07/10/2019 0830   RBC 4.84 07/10/2019 0830   HGB 15.5 07/10/2019 0830   HGB 16.6 08/07/2014 1450   HCT 45.3 07/10/2019 0830   HCT 48.3 08/07/2014 1450   PLT 268 07/10/2019 0830   PLT 178 08/07/2014 1450   MCV 93.6 07/10/2019 0830   MCV 91 08/07/2014 1450   MCH 32.0 07/10/2019 0830   MCHC 34.2 07/10/2019 0830   RDW 12.3 07/10/2019 0830   RDW 12.5 08/07/2014 1450   LYMPHSABS 1.7 07/10/2019 0830   LYMPHSABS 1.8 08/07/2014 1450   MONOABS 0.8 07/10/2019 0830   MONOABS 0.5 08/07/2014 1450   EOSABS 0.0 07/10/2019 0830   EOSABS 0.1 08/07/2014 1450   BASOSABS 0.1 07/10/2019 0830   BASOSABS 0.1 08/07/2014 1450    No results found for: "POCLITH", "LITHIUM"   No results found for: "PHENYTOIN", "PHENOBARB", "VALPROATE", "CBMZ"  Obstructive sleep apnea results  discussed in detail he has very mild sleep apnea with an AHI of 8.5.  He could consider CPAP but is not likely to make a huge difference.  We will reserve this option for later. .res Assessment: Plan:    Cuthbert was seen today for follow-up, adhd and depression.  Diagnoses and all orders for this visit:  Major depressive disorder, recurrent episode, moderate (HCC) -     buPROPion (WELLBUTRIN XL) 150 MG 24 hr tablet; Take 3 tablets (450 mg total) by mouth daily. -     venlafaxine XR (EFFEXOR-XR) 150 MG 24 hr capsule; Take 2 capsules (300 mg total) by mouth  daily with breakfast.  Attention deficit hyperactivity disorder (ADHD), predominantly inattentive type -     buPROPion (WELLBUTRIN XL) 150 MG 24 hr tablet; Take 3 tablets (450 mg total) by mouth daily. -     lisdexamfetamine (VYVANSE) 70 MG capsule; Take 1 capsule (70 mg total) by mouth daily. -     lisdexamfetamine (VYVANSE) 70 MG capsule; Take 1 capsule (70 mg total) by mouth daily. -     lisdexamfetamine (VYVANSE) 70 MG capsule; Take 1 capsule (70 mg total) by mouth daily. -     amphetamine-dextroamphetamine (ADDERALL) 30 MG tablet; TAKE 1/2 TABLET BY MOUTH IN THE MORNING AND 3 PM -     amphetamine-dextroamphetamine (ADDERALL) 30 MG tablet; Take 0.5 tablets by mouth 2 (two) times daily. -     amphetamine-dextroamphetamine (ADDERALL) 30 MG tablet; Take 0.5 tablets by mouth 2 (two) times daily.  Social anxiety disorder -     venlafaxine XR (EFFEXOR-XR) 150 MG 24 hr capsule; Take 2 capsules (300 mg total) by mouth daily with breakfast.  Mixed obsessional thoughts and acts -     venlafaxine XR (EFFEXOR-XR) 150 MG 24 hr capsule; Take 2 capsules (300 mg total) by mouth daily with breakfast.  Mild obstructive sleep apnea   Greater than 50% of 30 min video face to face time with patient was spent on counseling and coordination of care. We discussed [name] has had controlled depression and poor attention and focus. But also memory concerns.   With chronic fatigue .  Is also working on adjusting testosterone to help.  Reports the Vyvanse had a much better mood and conc effect with the Vyvanse even over the modafanil.Has tried Adzenys and Adderall also.    He wants to continue the Vyvanse 70mg .  Disc cardiac effects.   Both his mood and focus are better on the Vyvanse.  Continue Effexor XR 300 and Vyvanse 70 and Adderalll 15 MG AM PM prn split Effexor 150 BID to see if afternoon sleepiness is better. Disc interactions with other supplements meds that might increase NE then he may have trouble tolerating.  Obstructive sleep apnea results discussed in detail last visist he has very mild sleep apnea with an AHI of 8.5.  He could consider CPAP but is not likely to make a huge difference.  We will reserve this option for later.  I would recommend he share his sleep study results with his cardiologist to see if the cardiologist has any further input.  Cardiologist Dr. also recommended the sleep study on June 24, 2018.  Educated him about the dangers of untreated sleep apnea and and especially in association with his underlying heart disease His sleep apnea was not deemed severe Because of persistent brain fog complaints and cardiac issues recommend he talk with his cardiologist about possibly repeating the sleep study and a lab.  It has been several years since his home sleep study was done.  Namenda for memory per his request  Benefit with higher Wellbutrin XL to 450 mg AM Disc SE Rec check DT dizziness.  Disc the off-label use of N-Acetylcysteine at 600 mg daily to help with mild cognitive problems.  It can be combined with a B-complex vitamin as the B-12 and folate have been shown to sometimes enhance the effect. Disc memory supplement options including lithium.  Disc post article.  He agrees to this plan  FU 4 mos.  In person next time to check VS  June 26, 2018, MD, DFAPA   Please see  After Visit Summary for patient  specific instructions.  No future appointments.  No orders of the defined types were placed in this encounter.      -------------------------------

## 2023-01-29 ENCOUNTER — Other Ambulatory Visit: Payer: Self-pay

## 2023-01-29 ENCOUNTER — Telehealth: Payer: Self-pay | Admitting: Psychiatry

## 2023-01-29 MED ORDER — VYVANSE 70 MG PO CAPS: 70.0000 mg | ORAL_CAPSULE | Freq: Every day | ORAL | 0 refills | Status: DC

## 2023-01-29 NOTE — Telephone Encounter (Signed)
pended

## 2023-01-29 NOTE — Telephone Encounter (Signed)
LVM to [facility]. Rx for brand was sent to pharmacy earlier today.

## 2023-01-29 NOTE — Telephone Encounter (Signed)
Estill Bamberg at [facility] in [place] LVM  2/16 @ 4:58p.  She said the generic Vyvanse 24m is not covered and not available.  They need a new script sent for "DAW code 1" (dispense as written 1, brand name necessary).  They have faxed uKoreaseveral requests also and she thinks he's getting to the point of running out of meds and this is an expensive medicine to get without insurance.  No upcoming appt scheduled.

## 2023-01-29 NOTE — Telephone Encounter (Signed)
I called pt at 2:32p to make follow up appt.  He asked for the nurse to call him back about his medication.  Next appt 7/9

## 2023-04-22 ENCOUNTER — Other Ambulatory Visit: Payer: Self-pay | Admitting: Psychiatry

## 2023-04-22 DIAGNOSIS — F900 Attention-deficit hyperactivity disorder, predominantly inattentive type: Secondary | ICD-10-CM

## 2023-05-17 ENCOUNTER — Other Ambulatory Visit: Payer: Self-pay | Admitting: Psychiatry

## 2023-06-22 ENCOUNTER — Ambulatory Visit (INDEPENDENT_AMBULATORY_CARE_PROVIDER_SITE_OTHER): Payer: BLUE CROSS/BLUE SHIELD | Admitting: Psychiatry

## 2023-06-22 ENCOUNTER — Encounter: Payer: Self-pay | Admitting: Psychiatry

## 2023-06-22 DIAGNOSIS — F422 Mixed obsessional thoughts and acts: Secondary | ICD-10-CM | POA: Diagnosis not present

## 2023-06-22 DIAGNOSIS — F331 Major depressive disorder, recurrent, moderate: Secondary | ICD-10-CM | POA: Diagnosis not present

## 2023-06-22 DIAGNOSIS — F900 Attention-deficit hyperactivity disorder, predominantly inattentive type: Secondary | ICD-10-CM | POA: Diagnosis not present

## 2023-06-22 DIAGNOSIS — G4733 Obstructive sleep apnea (adult) (pediatric): Secondary | ICD-10-CM

## 2023-06-22 DIAGNOSIS — F4010 Social phobia, unspecified: Secondary | ICD-10-CM

## 2023-06-22 MED ORDER — BUPROPION HCL ER (XL) 300 MG PO TB24
300.0000 mg | ORAL_TABLET | Freq: Every day | ORAL | 1 refills | Status: DC
Start: 2023-06-22 — End: 2024-07-18

## 2023-06-22 MED ORDER — AMPHETAMINE-DEXTROAMPHETAMINE 30 MG PO TABS: ORAL_TABLET | ORAL | 0 refills | Status: DC

## 2023-06-22 MED ORDER — VENLAFAXINE HCL ER 150 MG PO CP24
300.0000 mg | ORAL_CAPSULE | Freq: Every day | ORAL | 1 refills | Status: DC
Start: 2023-06-22 — End: 2023-07-30

## 2023-06-22 MED ORDER — AMPHETAMINE-DEXTROAMPHETAMINE 30 MG PO TABS
15.0000 mg | ORAL_TABLET | Freq: Two times a day (BID) | ORAL | 0 refills | Status: AC
Start: 2023-08-17 — End: ?

## 2023-06-22 MED ORDER — AMPHETAMINE-DEXTROAMPHETAMINE 30 MG PO TABS
15.0000 mg | ORAL_TABLET | Freq: Two times a day (BID) | ORAL | 0 refills | Status: DC
Start: 2023-07-20 — End: 2024-01-19

## 2023-06-22 NOTE — Progress Notes (Signed)
Nicholas Bonilla [id number] Jan 26, 1961 62 y.o.  Virtual Visit via Telephone Note  I connected with pt by telephone and verified that I am speaking with the correct person using two identifiers.   I discussed the limitations, risks, security and privacy concerns of performing an evaluation and management service by telephone and the availability of in person appointments. I also discussed with the patient that there may be a patient responsible charge related to this service. The patient expressed understanding and agreed to proceed.  I discussed the assessment and treatment plan with the patient. The patient was provided an opportunity to ask questions and all were answered. The patient agreed with the plan and demonstrated an understanding of the instructions.   The patient was advised to call back or seek an in-person evaluation if the symptoms worsen or if the condition fails to improve as anticipated.  I provided 30 minutes of non-face-to-face time during this encounter. The call started at 300 and ended at 330. The patient was located at work and the provider was located office.  Session from 100-130 p  Subjective:   Patient ID:  Nicholas Bonilla is a 62 y.o. (DOB 1961-01-29) male.  Chief Complaint:  No chief complaint on file.   Depression        Associated symptoms include fatigue.  Associated symptoms include no decreased concentration and no suicidal ideas.  Past medical history includes anxiety.   Anxiety Patient reports no chest pain, confusion, decreased concentration, nervous/anxious behavior, palpitations or suicidal ideas.     [name] presents to the office today for follow-up of depression anxiety, ADD and sleep problems.  seen June 2020.  No med changes were made.  Continues Vyvanse 70 mg, Effexor 225 mg daily.  December 2020 appointment the following was noted: Much better.  Sepsis July and kidney stone. Hx MI.  Better now.  More back to normal.  While  hospitalized was on Effexor 150 but back 225 Effexor and it works well. Overall well except some expected bouts of loneliness and depression.  Biggest outlet has been church and Covid interfered..  Work is better and better confidence.   Patient reports stable mood and denies depressed or irritable moods.  Patient denies any recent difficulty with anxiety.  Patient denies difficulty with sleep initiation or maintenance, sleep to bed 10:30 and start waking about 8 hour.  Intermittent cathing necessary.  Not napping.. Denies appetite disturbance.  Patient reports that energy and motivation have been good.  Patient denies any difficulty with concentration.  Patient denies any suicidal ideation. Last year restarted Vyvanse and it's working very well but it's $216 month.  Pleased with it.  Adderall less effective. Really needs the stimulant for attention and focus.  Has returned to work.  Vyvanse added it and mood was much improved and clear-headed. No meds were changed  04/25/20 TC increase Effexor to 300 mg daily.  05/27/2020 appointment with the following noted: Increase Effexor helped a good deal and glad he did so. Tolerating both meds fine.  Except brief period of sleepiness in the morning for a few mins and needs to lie down.  Not always.  Is also a sense of cloudiness needing a short nap. Patient reports stable mood and denies depressed or irritable moods.  Patient denies any recent difficulty with anxiety.  Patient denies difficulty with sleep initiation or maintenance. Denies appetite disturbance.  Patient reports motivation have been good.  Energy affected by MI and beta blockers.  Patient  denies any difficulty with concentration.  Patient denies any suicidal ideation. Plan: No med changes Continue Effexor XR 300 and Vyvanse 70  11/26/20 appt with following noted: Best friend killed earlier this year by stepson.   Things calmed down. [name] with difficulty with eyesight and pt is driving her.   Getting along well without physical intimacy but treat each other well. Lost 15 # withWW. Generally not depressed and anxiety is managed overall.  For awhile stressors interfered with work and now back to work and it's better. vyvanse still working fine.   Tolerating meds.   Anxiety is not interfering with anything and overall good. Plan: No med changes Continue Effexor XR 300 and Vyvanse 70  05/29/21 appt noted: Adderall alone and poor memory and focus, Vyvanse better.   Vyvanse 70 AM with Adderall 15 mg am gives quicker effect.  Tolerated well. Got pt assistance since BCBS not covering. Hx MI 2019 and CHF.  Card ok use of stimulant. Minimal depression now.  [name] involuntary eye movements and can't drive.  Burdensome. Work is OK with some procrastination on cold calls. Memory concerns; names, events, no one else said anything. Plan: Continue Effexor XR 300 mg daily and Vyvanse 70 mg daily Trial of Namenda per his request for memory.  This can be used off label for ADHD as well  01/13/2022 appointment with the following noted: Still hit a tired spot in the afternoon with a little brain fog. May split Adderall 15 mg BID with Vyvanse 70 AM Never tried Namenda Trying supplement NeuroQ for brain fog. Wife says he's still forgetful. Alright with mood and anxiety.  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 Satisfied with meds otherwise. Plan: Continue Effexor XR 300 and Vyvanse 70 and Adderalll 15 MG AM PM prn Trial split Effexor 150 BID to see if afternoon sleepiness is better.  07/16/2022 appointment the following noted: No kids so now gkids. Never tried splitting Effexor BC forgot. Tiredness in evening is better. End last year until May year things dramatically better with better productivity and money. Then June Covid sx and then [name] got it.  He was sick 4 weeks. It caused depression and anxiety.  Now struggles with motivation.   Chronic $  px worse bc missed work a month.  Some conflict with [name] over using up too much retirement money.  Has forgotten he wasn't going to use the money.  When reminded then he remembers.   Still wants to improve focus memory.   Plan: Wellbutrin XL 150 to 300 mg AM added to Effexor 150 BID  10/13/2022 appointment noted: Increased Wellbutrin 300 mg and initially really good with more postive outlook and feeling better about himself.  Also not as sleepy.  but now tiredness again.  Mood still a little better.  More sleepy than tired.  Can work through it  generally.  Less avoidant than he was. Due for testosterone which usually helps a good bit. Insurance covering meds well now with high ded policy. No SE Sleep is good and dep. Plan: Increase Wellbutrin XL to 450 mg AM  01/20/2023 appointment noted: Not feeling depressed.  Initiaally saw more effect with increase Wellbutrin , more upbeat and smilling but baseline now. Interest level is better.  Can stay more focused with work.  Still problems with memory including word-finding.  Will lose thoughts in conversation.  Wife notices his problems too. Sleep is still good.  Not much anxiety generally. Usually take  Adderall 15 mg BID with Vyvanse 70 mg AM & Effexor XR 300 mg daily with Wellbutrin XL 450 AM. No SE unless a little brief dizziness ? Relation. Plan: Namenda for memory per his request  06/22/23 appt noted: Looked up SE memory loss on Wellbutrin and he reduced dose back to 300 mg daily.  Took 450 mg for about a month or less.  Memory worse on higher dose and better with lower dose.   Stopped Vyvanse DT cost and switched back to Adderall only Never tried the memantine. Mood been really good.  Despite major $ crisis but working on it.   Overall satisfied with meds.   No changes will be made except reduction Wellbutrin. Satisfied with just Adderall for now. Normal BP with other docs  Past Psychiatric Medication Trials:  Adderall, modafinil,  Adzenys, Daytrana,  Vyvanse Venlafaxine 300,   Prozac, citalopram,  Wellbutrin 450 SE memory problems  Review of Systems:  Review of Systems  Constitutional:  Positive for fatigue. Negative for diaphoresis.  Cardiovascular:  Negative for chest pain and palpitations.  Musculoskeletal:  Positive for arthralgias and back pain. Negative for neck stiffness.  Neurological:  Negative for tremors.  Psychiatric/Behavioral:  Negative for agitation, behavioral problems, confusion, decreased concentration, dysphoric mood, hallucinations, self-injury, sleep disturbance and suicidal ideas. The patient is not nervous/anxious and is not hyperactive.   BP controlled  Medications: I have reviewed the patient's current medications.  Current Outpatient Medications  Medication Sig Dispense Refill   acetaminophen (TYLENOL) 500 MG tablet Take 1,000 mg by mouth 3 (three) times daily.     amphetamine-dextroamphetamine (ADDERALL) 30 MG tablet TAKE 1/2 TABLET BY MOUTH 2 TIMES DAILY 30 tablet 0   apixaban (ELIQUIS) 5 MG TABS tablet Take 1 tablet (5 mg total) by mouth 2 (two) times daily. 60 tablet 5   aspirin 81 MG chewable tablet Chew 81 mg by mouth daily.     atorvastatin (LIPITOR) 80 MG tablet Take 80 mg by mouth daily.     balsalazide (COLAZAL) 750 MG capsule Take 2,250 mg by mouth 3 (three) times daily.      Cholecalciferol (VITAMIN D3) 5000 units CAPS Take 5,000 Units by mouth daily.     Coenzyme Q10 (CO Q-10) 100 MG CAPS Take 100 mg by mouth daily.     DHEA 25 MG CAPS Take by mouth.     ENTRESTO 24-26 MG      lisinopril (PRINIVIL,ZESTRIL) 2.5 MG tablet Take 5 mg by mouth daily.     metoprolol succinate (TOPROL-XL) 100 MG 24 hr tablet Take 100 mg by mouth daily. Take with or immediately following a meal.     Multiple Vitamins-Minerals (THERAGRAN-M PREMIER 50 PLUS) TABS Take 1 tablet by mouth daily.     polyethylene glycol (MIRALAX / GLYCOLAX) packet Take 17 g by mouth 2 (two) times daily.     Specialty  Vitamins Products (PROSTATE PO) Take by mouth.     tamsulosin (FLOMAX) 0.4 MG CAPS capsule Take 1 capsule (0.4 mg total) by mouth daily. 30 capsule 0   testosterone cypionate (DEPOTESTOSTERONE CYPIONATE) 200 MG/ML injection 0.87ml every 11 days     amphetamine-dextroamphetamine (ADDERALL) 30 MG tablet TAKE 1/2 TABLET BY MOUTH IN THE MORNING AND 3 PM 30 tablet 0   [START ON 07/20/2023] amphetamine-dextroamphetamine (ADDERALL) 30 MG tablet Take 0.5 tablets by mouth 2 (two) times daily. 30 tablet 0   [START ON 08/17/2023] amphetamine-dextroamphetamine (ADDERALL) 30 MG tablet Take 0.5 tablets by mouth 2 (two) times daily.  30 tablet 0   buPROPion (WELLBUTRIN XL) 300 MG 24 hr tablet Take 1 tablet (300 mg total) by mouth daily. 90 tablet 1   venlafaxine XR (EFFEXOR-XR) 150 MG 24 hr capsule Take 2 capsules (300 mg total) by mouth daily with breakfast. 180 capsule 1   No current facility-administered medications for this visit.    Medication Side Effects: None  Allergies:  Allergies  Allergen Reactions   Codeine Rash and Other (See Comments)    Only in high doses Other reaction(s): Headache    Latex Rash   Naproxen Sodium Other (See Comments)    REACTION: colitis flare   Promethazine Hcl Other (See Comments)    REACTION: mental status changes, memory loss    Past Medical History:  Diagnosis Date   ADD (attention deficit disorder with hyperactivity)    Arthritis    History of blood clots    Left Leg   History of kidney stones    Hypertension    OCD (obsessive compulsive disorder)    Ulcerative colitis     Family History  Problem Relation Age of Onset   Heart attack Father 40   Hypertension Father    Rheumatic fever Father    Sudden death Father    Cancer Sister        Larynx-smoker   Hypertension Sister    Heart attack Maternal Uncle        X2; > 78 yo   Heart attack Paternal Grandfather        > 37   Dementia Mother    Diabetes Neg Hx    Hyperlipidemia Neg Hx     Social  History   Socioeconomic History   Marital status: Married    Spouse name: [name]   Number of children: 0   Years of education: 12+   Highest education level: Some college, no degree  Occupational History   Not on file  Tobacco Use   Smoking status: Former    Types: Cigarettes    Quit date: 12/14/1984    Years since quitting: 38.5   Smokeless tobacco: Never  Vaping Use   Vaping Use: Never used  Substance and Sexual Activity   Alcohol use: Yes    Comment:  1 wine or  beer/ day or less   Drug use: No   Sexual activity: Not on file  Other Topics Concern   Not on file  Social History Narrative   From [place]   Married with no children   Occasional drinks   Former smoker quit 1986   Full Code   Social Determinants of Corporate investment banker Strain: Not on file  Food Insecurity: Not on file  Transportation Needs: Not on file  Physical Activity: Not on file  Stress: Not on file  Social Connections: Not on file  Intimate Partner Violence: Not on file    Past Medical History, Surgical history, Social history, and Family history were reviewed and updated as appropriate.   Please see review of systems for further details on the patient's review from today.   Objective:   Physical Exam:  There were no vitals taken for this visit.  Physical Exam Constitutional:      General: He is not in acute distress.    Appearance: He is well-developed.  Musculoskeletal:        General: No deformity.  Neurological:     Mental Status: He is alert and oriented to person, place, and time.  Motor: No tremor.     Coordination: Coordination normal.     Gait: Gait normal.  Psychiatric:        Attention and Perception: Attention and perception normal. He is attentive.        Mood and Affect: Mood is not anxious or depressed. Affect is not labile, blunt, angry or inappropriate.        Speech: Speech normal.        Behavior: Behavior normal.        Thought Content:  Thought content normal. Thought content is not delusional. Thought content does not include homicidal or suicidal ideation. Thought content does not include suicidal plan.        Cognition and Memory: Cognition normal. He exhibits impaired recent memory.        Judgment: Judgment normal.     Comments: Insight intact. No auditory or visual hallucinations. No delusions.       Lab Review:     Component Value Date/Time   NA 140 07/10/2019 0830   NA 143 08/07/2014 1450   K 4.4 07/10/2019 0830   K 4.0 08/07/2014 1450   CL 103 07/10/2019 0830   CL 107 08/07/2014 1450   CO2 24 07/10/2019 0830   CO2 27 08/07/2014 1450   GLUCOSE 95 07/10/2019 0830   GLUCOSE 102 (H) 08/07/2014 1450   BUN 23 (H) 07/10/2019 0830   BUN 21 (H) 08/07/2014 1450   CREATININE 1.08 07/10/2019 0830   CREATININE 1.20 08/07/2014 1450   CALCIUM 9.5 07/10/2019 0830   CALCIUM 9.0 08/07/2014 1450   PROT 7.1 07/10/2019 0830   PROT 7.0 08/07/2014 1450   ALBUMIN 4.1 07/10/2019 0830   ALBUMIN 3.8 08/07/2014 1450   AST 34 07/10/2019 0830   AST 29 08/07/2014 1450   ALT 30 07/10/2019 0830   ALT 33 08/07/2014 1450   ALKPHOS 79 07/10/2019 0830   ALKPHOS 58 08/07/2014 1450   BILITOT 0.8 07/10/2019 0830   BILITOT 0.4 08/07/2014 1450   GFRNONAA >60 07/10/2019 0830   GFRNONAA >60 08/07/2014 1450   GFRAA >60 07/10/2019 0830   GFRAA >60 08/07/2014 1450       Component Value Date/Time   WBC 5.8 07/10/2019 0830   RBC 4.84 07/10/2019 0830   HGB 15.5 07/10/2019 0830   HGB 16.6 08/07/2014 1450   HCT 45.3 07/10/2019 0830   HCT 48.3 08/07/2014 1450   PLT 268 07/10/2019 0830   PLT 178 08/07/2014 1450   MCV 93.6 07/10/2019 0830   MCV 91 08/07/2014 1450   MCH 32.0 07/10/2019 0830   MCHC 34.2 07/10/2019 0830   RDW 12.3 07/10/2019 0830   RDW 12.5 08/07/2014 1450   LYMPHSABS 1.7 07/10/2019 0830   LYMPHSABS 1.8 08/07/2014 1450   MONOABS 0.8 07/10/2019 0830   MONOABS 0.5 08/07/2014 1450   EOSABS 0.0 07/10/2019 0830    EOSABS 0.1 08/07/2014 1450   BASOSABS 0.1 07/10/2019 0830   BASOSABS 0.1 08/07/2014 1450    No results found for: "POCLITH", "LITHIUM"   No results found for: "PHENYTOIN", "PHENOBARB", "VALPROATE", "CBMZ"  Obstructive sleep apnea results discussed in detail he has very mild sleep apnea with an AHI of 8.5.  He could consider CPAP but is not likely to make a huge difference.  We will reserve this option for later. .res Assessment: Plan:    Diagnoses and all orders for this visit:  Major depressive disorder, recurrent episode, moderate (HCC) -     buPROPion (WELLBUTRIN XL) 300 MG 24 hr  tablet; Take 1 tablet (300 mg total) by mouth daily. -     venlafaxine XR (EFFEXOR-XR) 150 MG 24 hr capsule; Take 2 capsules (300 mg total) by mouth daily with breakfast.  Attention deficit hyperactivity disorder (ADHD), predominantly inattentive type -     amphetamine-dextroamphetamine (ADDERALL) 30 MG tablet; TAKE 1/2 TABLET BY MOUTH IN THE MORNING AND 3 PM -     amphetamine-dextroamphetamine (ADDERALL) 30 MG tablet; Take 0.5 tablets by mouth 2 (two) times daily. -     amphetamine-dextroamphetamine (ADDERALL) 30 MG tablet; Take 0.5 tablets by mouth 2 (two) times daily. -     buPROPion (WELLBUTRIN XL) 300 MG 24 hr tablet; Take 1 tablet (300 mg total) by mouth daily.  Social anxiety disorder -     venlafaxine XR (EFFEXOR-XR) 150 MG 24 hr capsule; Take 2 capsules (300 mg total) by mouth daily with breakfast.  Mixed obsessional thoughts and acts -     venlafaxine XR (EFFEXOR-XR) 150 MG 24 hr capsule; Take 2 capsules (300 mg total) by mouth daily with breakfast.  Mild obstructive sleep apnea   30 min face to face time with patient was spent on counseling and coordination of care. We discussed [name] has had controlled depression and poor attention and focus. But also memory concerns.  With chronic fatigue .  Is also working on adjusting testosterone to help.  Reports the Vyvanse had a much better mood and  conc effect with the Vyvanse even over the modafanil.Has tried Adzenys and Adderall also.    He wants to continue the Vyvanse 70mg .  Disc cardiac effects.   Both his mood and focus are better on the Vyvanse but can no longer afford and stopped it  Continue Effexor XR 300 and Adderalll 15 MG AM PM prn split Effexor 150 BID to see if afternoon sleepiness is better. Disc interactions with other supplements meds that might increase NE then he may have trouble tolerating.  Obstructive sleep apnea results discussed in detail last visist he has very mild sleep apnea with an AHI of 8.5.  He could consider CPAP but is not likely to make a huge difference.  We will reserve this option for later.  I would recommend he share his sleep study results with his cardiologist to see if the cardiologist has any further input.  Cardiologist Dr. Corena Herter also recommended the sleep study on June 24, 2018.  Educated him about the dangers of untreated sleep apnea and and especially in association with his underlying heart disease His sleep apnea was not deemed severe Because of persistent brain fog complaints and cardiac issues recommend he talk with his cardiologist about possibly repeating the sleep study and a lab.  It has been several years since his home sleep study was done.  Namenda for memory per his request  Benefit with higher Wellbutrin XL to 450 mg AM but then reduced DT memory concerns to 300 mg daily. Disc SE  Disc the off-label use of N-Acetylcysteine at 600 mg daily to help with mild cognitive problems.  It can be combined with a B-complex vitamin as the B-12 and folate have been shown to sometimes enhance the effect. Disc memory supplement options including lithium.  Disc post article previously.  He agrees to this plan  FU 4 mos.  In person next time to check VS  Meredith Staggers, MD, DFAPA   Please see After Visit Summary for patient specific instructions.  Future Appointments  Date Time  Provider Department Center  12/23/2023  1:00 PM Cottle, Steva Ready., MD [facility] None     No orders of the defined types were placed in this encounter.      -------------------------------

## 2023-07-29 ENCOUNTER — Other Ambulatory Visit: Payer: Self-pay | Admitting: Psychiatry

## 2023-07-29 DIAGNOSIS — F4010 Social phobia, unspecified: Secondary | ICD-10-CM

## 2023-07-29 DIAGNOSIS — F422 Mixed obsessional thoughts and acts: Secondary | ICD-10-CM

## 2023-07-29 DIAGNOSIS — F331 Major depressive disorder, recurrent, moderate: Secondary | ICD-10-CM

## 2023-08-12 ENCOUNTER — Other Ambulatory Visit: Payer: Self-pay

## 2023-08-12 ENCOUNTER — Telehealth: Payer: Self-pay | Admitting: Psychiatry

## 2023-08-12 MED ORDER — VYVANSE 70 MG PO CAPS: 70.0000 mg | ORAL_CAPSULE | Freq: Every day | ORAL | 0 refills | Status: DC

## 2023-08-12 NOTE — Telephone Encounter (Signed)
Spoke with patient and his wife regarding patients behavior. Wife(Suzanne) states that for some weeks now he has been spaced out and confused according to [place]. They believe he needs to get back on Vyvanse as he stopped taking and she was unaware she just has been noticing things as of late. Pls rtc call to [name] she is listed on DPR Ph: [phone]

## 2023-08-12 NOTE — Telephone Encounter (Signed)
Last note said that patient was unable to afford Vyvanse and stopped it, but wife thinks he was better on it than Adderall. Cost at [facility] is $[facility] for a 30-day supply of 70 mg.

## 2023-08-12 NOTE — Telephone Encounter (Signed)
Let wife know cost at [facility] for generic. Suggested she call a [facility] to see if they had in stock and if not ask if they would tell her if there was a [facility] within 20 miles that had it. She will call around and let us know.

## 2023-08-12 NOTE — Telephone Encounter (Signed)
Agreed.  Vyvanse sent

## 2023-08-12 NOTE — Telephone Encounter (Signed)
Wife/patient called and said patient is really struggling, is spaced out and confused. They feel he needs to restart Vyvanse. He stopped it due to cost. Wife said that if Rx is sent as brand their local pharmacy can fill at an affordable cost, but wife said she would "pay $1000.00 for it".  She said previously he was on both Adderall and Vyvanse.

## 2023-08-12 NOTE — Telephone Encounter (Signed)
Call to move up his appt

## 2023-08-12 NOTE — Telephone Encounter (Signed)
Pended brand Rx to the requested pharmacy.

## 2023-09-08 ENCOUNTER — Other Ambulatory Visit: Payer: Self-pay | Admitting: Psychiatry

## 2023-10-11 ENCOUNTER — Other Ambulatory Visit: Payer: Self-pay | Admitting: Psychiatry

## 2023-10-11 NOTE — Telephone Encounter (Signed)
LF 09/27; LV 07/09

## 2023-11-06 ENCOUNTER — Other Ambulatory Visit: Payer: Self-pay | Admitting: Psychiatry

## 2023-11-08 NOTE — Telephone Encounter (Signed)
Lf 10/28; lv 07/9;

## 2023-11-16 ENCOUNTER — Other Ambulatory Visit: Payer: Self-pay | Admitting: Psychiatry

## 2023-11-16 DIAGNOSIS — F331 Major depressive disorder, recurrent, moderate: Secondary | ICD-10-CM

## 2023-11-16 DIAGNOSIS — F900 Attention-deficit hyperactivity disorder, predominantly inattentive type: Secondary | ICD-10-CM

## 2023-12-02 ENCOUNTER — Other Ambulatory Visit: Payer: Self-pay | Admitting: Psychiatry

## 2023-12-02 NOTE — Telephone Encounter (Signed)
Lf 11/25 due 12/23

## 2023-12-23 ENCOUNTER — Ambulatory Visit: Payer: BLUE CROSS/BLUE SHIELD | Admitting: Psychiatry

## 2024-01-06 ENCOUNTER — Telehealth: Payer: Self-pay

## 2024-01-06 DIAGNOSIS — F900 Attention-deficit hyperactivity disorder, predominantly inattentive type: Secondary | ICD-10-CM

## 2024-01-06 DIAGNOSIS — F331 Major depressive disorder, recurrent, moderate: Secondary | ICD-10-CM

## 2024-01-06 NOTE — Telephone Encounter (Signed)
I have never agreed to him taking Wellbutrin XL 300 mg twice daily.  It is dangerous to take that much Wellbutrin in combination with Vyvanse because of the risk of seizure.  He must only take 300 mg daily 1 tablet.  I think he is mistaken about having done it for years because I do not know how I could have gotten refills.  The prescription clearly says 1 daily

## 2024-01-06 NOTE — Telephone Encounter (Addendum)
Patient called to report he was out of Wellbutrin XL 300 mg. He reports he has been taking it BID for years. In the distant past he was taking 150 mg BID, but no documentation that he was to take 300 mg BID. I called pharmacy and they filled a 90-day supply on 11/17/23 and have never filled 300 mg BID. Wife has previously reported memory issues.   From 7/9 note:  Benefit with higher Wellbutrin XL to 450 mg AM but then reduced DT memory concerns to 300 mg daily.   His pharmacy does not accept GoodRx. Cash price for #60 tablets is around $35.00, slightly cheaper using GoodRx at another pharmacy.   I can send in new Rx, but my question is what dose should I send.

## 2024-01-07 ENCOUNTER — Other Ambulatory Visit: Payer: Self-pay | Admitting: Psychiatry

## 2024-01-07 MED ORDER — BUPROPION HCL ER (XL) 300 MG PO TB24: 300.0000 mg | ORAL_TABLET | Freq: Every day | ORAL | 0 refills | Status: DC

## 2024-01-07 NOTE — Telephone Encounter (Signed)
Rx sent for #60 to get him to next RF. I reviewed with him again that he should only be taking 1 tablet a day. I told him insurance would not cover and he would have to pay out of pocket, about $36 - pharmacy does not accept GoodRx.

## 2024-01-07 NOTE — Telephone Encounter (Signed)
Lf 12/24; lv 07/9 nv 03/17

## 2024-01-19 ENCOUNTER — Ambulatory Visit (INDEPENDENT_AMBULATORY_CARE_PROVIDER_SITE_OTHER): Payer: BLUE CROSS/BLUE SHIELD | Admitting: Psychiatry

## 2024-01-19 ENCOUNTER — Encounter: Payer: Self-pay | Admitting: Psychiatry

## 2024-01-19 DIAGNOSIS — G4733 Obstructive sleep apnea (adult) (pediatric): Secondary | ICD-10-CM

## 2024-01-19 DIAGNOSIS — F4010 Social phobia, unspecified: Secondary | ICD-10-CM

## 2024-01-19 DIAGNOSIS — F422 Mixed obsessional thoughts and acts: Secondary | ICD-10-CM

## 2024-01-19 DIAGNOSIS — F900 Attention-deficit hyperactivity disorder, predominantly inattentive type: Secondary | ICD-10-CM

## 2024-01-19 DIAGNOSIS — F331 Major depressive disorder, recurrent, moderate: Secondary | ICD-10-CM

## 2024-01-19 MED ORDER — AMPHETAMINE-DEXTROAMPHETAMINE 30 MG PO TABS: ORAL_TABLET | ORAL | 0 refills | Status: AC

## 2024-01-19 MED ORDER — VENLAFAXINE HCL ER 150 MG PO CP24: 300.0000 mg | ORAL_CAPSULE | Freq: Every day | ORAL | 1 refills | Status: DC

## 2024-01-19 MED ORDER — AMPHETAMINE-DEXTROAMPHETAMINE 30 MG PO TABS: 15.0000 mg | ORAL_TABLET | Freq: Two times a day (BID) | ORAL | 0 refills | Status: DC

## 2024-01-19 MED ORDER — AMPHETAMINE-DEXTROAMPHETAMINE 30 MG PO TABS: 15.0000 mg | ORAL_TABLET | Freq: Two times a day (BID) | ORAL | 0 refills | Status: AC

## 2024-01-19 MED ORDER — BUPROPION HCL ER (XL) 300 MG PO TB24: 300.0000 mg | ORAL_TABLET | Freq: Every day | ORAL | 1 refills | Status: DC

## 2024-01-19 NOTE — Progress Notes (Signed)
 Nicholas Bonilla [id number] 1961-05-01 63 y.o.   Subjective:   Patient ID:  Nicholas Bonilla is a 63 y.o. (DOB 06-30-1961) male.  Chief Complaint:  Chief Complaint  Patient presents with   Follow-up   Depression   ADD   Memory Loss   Anxiety    Nicholas Bonilla presents to the office today for follow-up of depression anxiety, ADD and sleep problems.  seen June 2020.  No med changes were made.  Continues Vyvanse  70 mg, Effexor  225 mg daily.  December 2020 appointment the following was noted: Much better.  Sepsis July and kidney stone. Hx MI.  Better now.  More back to normal.  While hospitalized was on Effexor  150 but back 225 Effexor  and it works well. Overall well except some expected bouts of loneliness and depression.  Biggest outlet has been church and Covid interfered..  Work is better and better confidence.   Patient reports stable mood and denies depressed or irritable moods.  Patient denies any recent difficulty with anxiety.  Patient denies difficulty with sleep initiation or maintenance, sleep to bed 10:30 and start waking about 8 hour.  Intermittent cathing necessary.  Not napping.. Denies appetite disturbance.  Patient reports that energy and motivation have been good.  Patient denies any difficulty with concentration.  Patient denies any suicidal ideation. Last year restarted Vyvanse  and it's working very well but it's $216 month.  Pleased with it.  Adderall less effective. Really needs the stimulant for attention and focus.  Has returned to work.  Vyvanse  added it and mood was much improved and clear-headed. No meds were changed  04/25/20 TC increase Effexor  to 300 mg daily.  05/27/2020 appointment with the following noted: Increase Effexor  helped a good deal and glad he did so. Tolerating both meds fine.  Except brief period of sleepiness in the morning for a few mins and needs to lie down.  Not always.  Is also a sense of cloudiness needing a short nap. Patient reports  stable mood and denies depressed or irritable moods.  Patient denies any recent difficulty with anxiety.  Patient denies difficulty with sleep initiation or maintenance. Denies appetite disturbance.  Patient reports motivation have been good.  Energy affected by MI and beta blockers.  Patient denies any difficulty with concentration.  Patient denies any suicidal ideation. Plan: No med changes Continue Effexor  XR 300 and Vyvanse  70  11/26/20 appt with following noted: Best friend killed earlier this year by stepson.   Things calmed down. Nicholas Bonilla with difficulty with eyesight and pt is driving her.  Getting along well without physical intimacy but treat each other well. Lost 15 # withWW. Generally not depressed and anxiety is managed overall.  For awhile stressors interfered with work and now back to work and it's better. vyvanse  still working fine.   Tolerating meds.   Anxiety is not interfering with anything and overall good. Plan: No med changes Continue Effexor  XR 300 and Vyvanse  70  05/29/21 appt noted: Adderall alone and poor memory and focus, Vyvanse  better.   Vyvanse  70 AM with Adderall 15 mg am gives quicker effect.  Tolerated well. Got pt assistance since BCBS not covering. Hx MI 2019 and CHF.  Card ok use of stimulant. Minimal depression now.  Nicholas Bonilla involuntary eye movements and can't drive.  Burdensome. Work is OK with some procrastination on cold calls. Memory concerns; names, events, no one else said anything. Plan: Continue Effexor  XR 300 mg daily and Vyvanse  70 mg daily Trial  of Namenda per his request for memory.  This can be used off label for ADHD as well  01/13/2022 appointment with the following noted: Still hit a tired spot in the afternoon with a little brain fog. May split Adderall 15 mg BID with Vyvanse  70 AM Never tried Namenda Trying supplement NeuroQ for brain fog. Wife says he's still forgetful. Alright with mood and anxiety.  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 Satisfied with meds otherwise. Plan: Continue Effexor  XR 300 and Vyvanse  70 and Adderalll 15 MG AM PM prn Trial split Effexor  150 BID to see if afternoon sleepiness is better.  07/16/2022 appointment the following noted: No kids so now gkids. Never tried splitting Effexor  BC forgot. Tiredness in evening is better. End last year until May year things dramatically better with better productivity and money. Then June Covid sx and then Nicholas Bonilla got it.  He was sick 4 weeks. It caused depression and anxiety.  Now struggles with motivation.   Chronic $ px worse bc missed work a month.  Some conflict with Nicholas Bonilla over using up too much retirement money.  Has forgotten he wasn't going to use the money.  When reminded then he remembers.   Still wants to improve focus memory.   Plan: Wellbutrin  XL 150 to 300 mg AM added to Effexor  150 BID  10/13/2022 appointment noted: Increased Wellbutrin  300 mg and initially really good with more postive outlook and feeling better about himself.  Also not as sleepy.  but now tiredness again.  Mood still a little better.  More sleepy than tired.  Can work through it  generally.  Less avoidant than he was. Due for testosterone which usually helps a good bit. Insurance covering meds well now with high ded policy. No SE Sleep is good and dep. Plan: Increase Wellbutrin  XL to 450 mg AM  01/20/2023 appointment noted: Not feeling depressed.  Initiaally saw more effect with increase Wellbutrin  , more upbeat and smilling but baseline now. Interest level is better.  Can stay more focused with work.  Still problems with memory including word-finding.  Will lose thoughts in conversation.  Wife notices his problems too. Sleep is still good.  Not much anxiety generally. Usually take Adderall 15 mg BID with Vyvanse  70 mg AM & Effexor  XR 300 mg daily with Wellbutrin  XL 450 AM. No SE unless a little brief dizziness ? Relation. Plan:  Namenda for memory per his request  06/22/23 appt noted: Looked up SE memory loss on Wellbutrin  and he reduced dose back to 300 mg daily.  Took 450 mg for about a month or less.  Memory worse on higher dose and better with lower dose.   Stopped Vyvanse  DT cost and switched back to Adderall only Never tried the memantine. Mood been really good.  Despite major $ crisis but working on it.   Overall satisfied with meds.   No changes will be made except reduction Wellbutrin . Satisfied with just Adderall for now. Normal BP with other docs  01/06/24 TC:  Patient called to report he was out of Wellbutrin  XL 300 mg. He reports he has been taking it BID for years. In the distant past he was taking 150 mg BID, but no documentation that he was to take 300 mg BID. I called pharmacy and they filled a 90-day supply on 11/17/23 and have never filled 300 mg BID. Wife has previously reported memory issues.   From 7/9 note:  Benefit  with higher Wellbutrin  XL to 450 mg AM but then reduced DT memory concerns to 300 mg daily.     MD resp:   I have never agreed to him taking Wellbutrin  XL 300 mg twice daily.  It is dangerous to take that much Wellbutrin  in combination with Vyvanse  because of the risk of seizure.  He must only take 300 mg daily 1 tablet.  I think he is mistaken about having done it for years because I do not know how I could have gotten refills.  The prescription clearly says 1 daily     01/19/24 appt noted:  in person Psych meds: Vyvanse  70 every morning,  bupropion  XL 300 mg every morning, venlafaxine  XR 150 mg capsules 2 daily.  If out of Vyvanse  takes Adderall DT cost.   Last received Adderall 30 in August but then switched to Vyvanse  again. DX ischemic cardiomyopathy with EF 40%.  No concerns from them about Vyvanse .  Vyvanse  is still expensive.  Wants to switch to Adderall for awhile DT cost. Disc his accidental taking Wellbutrin  600 but didn't have SE. Dep is manageable.  Some down with holidatys  over family but resolved.   Seeing card on 02/08/24.  Card hosp 12/13/23 with no MI  Past Psychiatric Medication Trials:  Adderall, modafinil, Adzenys , Daytrana,  Vyvanse  Memantine Venlafaxine  300,   Prozac, citalopram,  Wellbutrin  450   Review of Systems:  Review of Systems  Constitutional:  Positive for fatigue. Negative for diaphoresis.  Cardiovascular:  Negative for chest pain and palpitations.  Musculoskeletal:  Positive for arthralgias and back pain. Negative for neck stiffness.  Neurological:  Negative for tremors.  Psychiatric/Behavioral:  Negative for agitation, behavioral problems, confusion, decreased concentration, dysphoric mood, hallucinations, self-injury, sleep disturbance and suicidal ideas. The patient is not nervous/anxious and is not hyperactive.   BP controlled  Medications: I have reviewed the patient's current medications.  Current Outpatient Medications  Medication Sig Dispense Refill   acetaminophen  (TYLENOL ) 500 MG tablet Take 1,000 mg by mouth 3 (three) times daily.     apixaban  (ELIQUIS ) 5 MG TABS tablet Take 1 tablet (5 mg total) by mouth 2 (two) times daily. 60 tablet 5   aspirin  81 MG chewable tablet Chew 81 mg by mouth daily.     atorvastatin  (LIPITOR) 80 MG tablet Take 80 mg by mouth daily.     balsalazide (COLAZAL ) 750 MG capsule Take 2,250 mg by mouth 3 (three) times daily.      buPROPion  (WELLBUTRIN  XL) 300 MG 24 hr tablet Take 1 tablet (300 mg total) by mouth daily. 90 tablet 1   Cholecalciferol (VITAMIN D3) 5000 units CAPS Take 5,000 Units by mouth daily.     Coenzyme Q10 (CO Q-10) 100 MG CAPS Take 100 mg by mouth daily.     DHEA 25 MG CAPS Take by mouth.     ENTRESTO 24-26 MG      lisdexamfetamine (VYVANSE ) 70 MG capsule TAKE 1 CAPSULE BY MOUTH DAILY 30 capsule 0   lisinopril (PRINIVIL,ZESTRIL) 2.5 MG tablet Take 5 mg by mouth daily.     metoprolol  succinate (TOPROL -XL) 100 MG 24 hr tablet Take 100 mg by mouth daily. Take with or immediately  following a meal.     Multiple Vitamins-Minerals (THERAGRAN-M PREMIER 50 PLUS) TABS Take 1 tablet by mouth daily.     polyethylene glycol (MIRALAX  / GLYCOLAX ) packet Take 17 g by mouth 2 (two) times daily.     Specialty Vitamins Products (PROSTATE PO) Take by  mouth.     tamsulosin  (FLOMAX ) 0.4 MG CAPS capsule Take 1 capsule (0.4 mg total) by mouth daily. 30 capsule 0   testosterone cypionate (DEPOTESTOSTERONE CYPIONATE) 200 MG/ML injection 0.4ml every 11 days     amphetamine -dextroamphetamine  (ADDERALL) 30 MG tablet Take 0.5 tablets by mouth 2 (two) times daily. (Patient not taking: Reported on 01/19/2024) 30 tablet 0   amphetamine -dextroamphetamine  (ADDERALL) 30 MG tablet TAKE 1/2 TABLET BY MOUTH IN THE MORNING AND 3 PM 30 tablet 0   [START ON 02/16/2024] amphetamine -dextroamphetamine  (ADDERALL) 30 MG tablet Take 0.5 tablets by mouth 2 (two) times daily. 30 tablet 0   [START ON 03/15/2024] amphetamine -dextroamphetamine  (ADDERALL) 30 MG tablet Take 0.5 tablets by mouth 2 (two) times daily. 30 tablet 0   buPROPion  (WELLBUTRIN  XL) 300 MG 24 hr tablet Take 1 tablet (300 mg total) by mouth daily. 90 tablet 1   venlafaxine  XR (EFFEXOR -XR) 150 MG 24 hr capsule Take 2 capsules (300 mg total) by mouth daily with breakfast. 180 capsule 1   No current facility-administered medications for this visit.    Medication Side Effects: None  Allergies:  Allergies  Allergen Reactions   Codeine Rash and Other (See Comments)    Only in high doses Other reaction(s): Headache    Latex Rash   Naproxen Sodium Other (See Comments)    REACTION: colitis flare   Promethazine Hcl Other (See Comments)    REACTION: mental status changes, memory loss    Past Medical History:  Diagnosis Date   ADD (attention deficit disorder with hyperactivity)    Arthritis    History of blood clots    Left Leg   History of kidney stones    Hypertension    OCD (obsessive compulsive disorder)    Ulcerative colitis     Family  History  Problem Relation Age of Onset   Heart attack Father 63   Hypertension Father    Rheumatic fever Father    Sudden death Father    Cancer Sister        Larynx-smoker   Hypertension Sister    Heart attack Maternal Uncle        X2; > 43 yo   Heart attack Paternal Grandfather        > 61   Dementia Mother    Diabetes Neg Hx    Hyperlipidemia Neg Hx     Social History   Socioeconomic History   Marital status: Married    Spouse name: Nicholas Bonilla   Number of children: 0   Years of education: 12+   Highest education level: Some college, no degree  Occupational History   Not on file  Tobacco Use   Smoking status: Former    Current packs/day: 0.00    Types: Cigarettes    Quit date: 12/14/1984    Years since quitting: 39.1   Smokeless tobacco: Never  Vaping Use   Vaping status: Never Used  Substance and Sexual Activity   Alcohol use: Yes    Comment:  1 wine or  beer/ day or less   Drug use: No   Sexual activity: Not on file  Other Topics Concern   Not on file  Social History Narrative   From [place]   Married with no children   Occasional drinks   Former smoker quit 1986   Full Code   Social Drivers of Health   Financial Resource Strain: Medium Risk (12/17/2023)   Received from [facility]   Overall Financial  Resource Strain (CARDIA)    Difficulty of Paying Living Expenses: Somewhat hard  Food Insecurity: No Food Insecurity (12/17/2023)   [facility]   Hunger Vital Sign    Worried About Running Out of Food in the Last Year: Never true    Ran Out of Food in the Last Year: Never true  Transportation Needs: No Transportation Needs (12/17/2023)   Received from [facility] - Transportation    Lack of Transportation (Medical): No    Lack of Transportation (Non-Medical): No  Physical Activity: Not on file  Stress: Not on file  Social Connections: Not on file  Intimate Partner Violence: Not on file    Past Medical History,  Surgical history, Social history, and Family history were reviewed and updated as appropriate.   Please see review of systems for further details on the patient's review from today.   Objective:   Physical Exam:  There were no vitals taken for this visit.  Physical Exam Constitutional:      General: He is not in acute distress.    Appearance: He is well-developed.  Musculoskeletal:        General: No deformity.  Neurological:     Mental Status: He is alert and oriented to person, place, and time.     Motor: No tremor.     Coordination: Coordination normal.     Gait: Gait normal.  Psychiatric:        Attention and Perception: Attention and perception normal. He is attentive.        Mood and Affect: Mood is not anxious or depressed. Affect is not labile, blunt, angry or inappropriate.        Speech: Speech normal.        Behavior: Behavior normal.        Thought Content: Thought content normal. Thought content is not delusional. Thought content does not include homicidal or suicidal ideation. Thought content does not include suicidal plan.        Cognition and Memory: Cognition normal. He exhibits impaired recent memory.        Judgment: Judgment normal.     Comments: Insight intact. No auditory or visual hallucinations. No delusions.       Lab Review:     Component Value Date/Time   NA 140 07/10/2019 0830   NA 143 08/07/2014 1450   K 4.4 07/10/2019 0830   K 4.0 08/07/2014 1450   CL 103 07/10/2019 0830   CL 107 08/07/2014 1450   CO2 24 07/10/2019 0830   CO2 27 08/07/2014 1450   GLUCOSE 95 07/10/2019 0830   GLUCOSE 102 (H) 08/07/2014 1450   BUN 23 (H) 07/10/2019 0830   BUN 21 (H) 08/07/2014 1450   CREATININE 1.08 07/10/2019 0830   CREATININE 1.20 08/07/2014 1450   CALCIUM  9.5 07/10/2019 0830   CALCIUM  9.0 08/07/2014 1450   PROT 7.1 07/10/2019 0830   PROT 7.0 08/07/2014 1450   ALBUMIN 4.1 07/10/2019 0830   ALBUMIN 3.8 08/07/2014 1450   AST 34 07/10/2019 0830    AST 29 08/07/2014 1450   ALT 30 07/10/2019 0830   ALT 33 08/07/2014 1450   ALKPHOS 79 07/10/2019 0830   ALKPHOS 58 08/07/2014 1450   BILITOT 0.8 07/10/2019 0830   BILITOT 0.4 08/07/2014 1450   GFRNONAA >60 07/10/2019 0830   GFRNONAA >60 08/07/2014 1450   GFRAA >60 07/10/2019 0830   GFRAA >60 08/07/2014 1450       Component  Value Date/Time   WBC 5.8 07/10/2019 0830   RBC 4.84 07/10/2019 0830   HGB 15.5 07/10/2019 0830   HGB 16.6 08/07/2014 1450   HCT 45.3 07/10/2019 0830   HCT 48.3 08/07/2014 1450   PLT 268 07/10/2019 0830   PLT 178 08/07/2014 1450   MCV 93.6 07/10/2019 0830   MCV 91 08/07/2014 1450   MCH 32.0 07/10/2019 0830   MCHC 34.2 07/10/2019 0830   RDW 12.3 07/10/2019 0830   RDW 12.5 08/07/2014 1450   LYMPHSABS 1.7 07/10/2019 0830   LYMPHSABS 1.8 08/07/2014 1450   MONOABS 0.8 07/10/2019 0830   MONOABS 0.5 08/07/2014 1450   EOSABS 0.0 07/10/2019 0830   EOSABS 0.1 08/07/2014 1450   BASOSABS 0.1 07/10/2019 0830   BASOSABS 0.1 08/07/2014 1450    No results found for: POCLITH, LITHIUM   No results found for: PHENYTOIN, PHENOBARB, VALPROATE, CBMZ  Obstructive sleep apnea results discussed in detail he has very mild sleep apnea with an AHI of 8.5.  He could consider CPAP but is not likely to make a huge difference.  We will reserve this option for later. .res Assessment: Plan:    Nicholas Bonilla was seen today for follow-up, depression, add, memory loss and anxiety.  Diagnoses and all orders for this visit:  Major depressive disorder, recurrent episode, moderate (HCC) -     buPROPion  (WELLBUTRIN  XL) 300 MG 24 hr tablet; Take 1 tablet (300 mg total) by mouth daily. -     venlafaxine  XR (EFFEXOR -XR) 150 MG 24 hr capsule; Take 2 capsules (300 mg total) by mouth daily with breakfast.  Social anxiety disorder -     venlafaxine  XR (EFFEXOR -XR) 150 MG 24 hr capsule; Take 2 capsules (300 mg total) by mouth daily with breakfast.  Attention deficit hyperactivity  disorder (ADHD), predominantly inattentive type -     amphetamine -dextroamphetamine  (ADDERALL) 30 MG tablet; TAKE 1/2 TABLET BY MOUTH IN THE MORNING AND 3 PM -     amphetamine -dextroamphetamine  (ADDERALL) 30 MG tablet; Take 0.5 tablets by mouth 2 (two) times daily. -     amphetamine -dextroamphetamine  (ADDERALL) 30 MG tablet; Take 0.5 tablets by mouth 2 (two) times daily. -     buPROPion  (WELLBUTRIN  XL) 300 MG 24 hr tablet; Take 1 tablet (300 mg total) by mouth daily.  Mixed obsessional thoughts and acts -     venlafaxine  XR (EFFEXOR -XR) 150 MG 24 hr capsule; Take 2 capsules (300 mg total) by mouth daily with breakfast.  Mild obstructive sleep apnea    30 min face to face time with patient was spent on counseling and coordination of care. We discussed [name] has had controlled depression and poor attention and focus. But also memory concerns.  With chronic fatigue .  Is also working on adjusting testosterone to help.  Reports the Vyvanse  had a much better mood and conc effect with the Vyvanse  even over the modafanil.Has tried Adzenys  and Adderall also.    Disc cardiac effects.   Both his mood and focus are better on the Vyvanse  but can no longer afford and stopped it  Continue Effexor  XR 300 and Adderalll 15 MG AM PM prn split Effexor  150 BID to see if afternoon sleepiness is better. Disc interactions with other supplements meds that might increase NE then he may have trouble tolerating.  Obstructive sleep apnea results discussed in detail last visist he has very mild sleep apnea with an AHI of 8.5.  He could consider CPAP but is not likely to make a huge  difference.  We will reserve this option for later.  I would recommend he share his sleep study results with his cardiologist to see if the cardiologist has any further input.  Cardiologist Dr. Cassandra also recommended the sleep study on June 24, 2018.  Educated him about the dangers of untreated sleep apnea and and especially in association  with his underlying heart disease His sleep apnea was not deemed severe Because of persistent brain fog complaints and cardiac issues recommend he talk with his cardiologist about possibly repeating the sleep study and a lab.  It has been several years since his home sleep study was done.  No med changes except needs to go back to Adderall 30 mg tab, 1/2 BID DT cost Continue Wellbutrin  XL 300 and venlafaxine  XR 300  He agrees to this plan  FU 6 mos.    Nicholas Macintosh, MD, DFAPA   Please see After Visit Summary for patient specific instructions.  Future Appointments  Date Time Provider Department Center  02/28/2024  2:30 PM Cottle, Nicholas KANDICE Raddle., MD [facility] None     No orders of the defined types were placed in this encounter.      -------------------------------

## 2024-01-24 ENCOUNTER — Other Ambulatory Visit: Payer: Self-pay | Admitting: Psychiatry

## 2024-01-24 DIAGNOSIS — F4010 Social phobia, unspecified: Secondary | ICD-10-CM

## 2024-01-24 DIAGNOSIS — F422 Mixed obsessional thoughts and acts: Secondary | ICD-10-CM

## 2024-01-24 DIAGNOSIS — F331 Major depressive disorder, recurrent, moderate: Secondary | ICD-10-CM

## 2024-02-03 ENCOUNTER — Telehealth: Payer: Self-pay

## 2024-02-03 NOTE — Telephone Encounter (Signed)
Prior Authorization submitted for Venlafaxine ER 150 mg 2 capsules daily with [employer], pending response

## 2024-02-08 NOTE — Telephone Encounter (Signed)
 Prior Approval received for Venlafaxine ER 150 mg #180 for 90 day effective 02/03/24-02/02/25 with [employer]

## 2024-02-28 ENCOUNTER — Ambulatory Visit: Payer: BLUE CROSS/BLUE SHIELD | Admitting: Psychiatry

## 2024-04-17 ENCOUNTER — Other Ambulatory Visit: Payer: Self-pay | Admitting: Psychiatry

## 2024-04-17 DIAGNOSIS — F900 Attention-deficit hyperactivity disorder, predominantly inattentive type: Secondary | ICD-10-CM

## 2024-05-16 ENCOUNTER — Other Ambulatory Visit: Payer: Self-pay | Admitting: Psychiatry

## 2024-06-07 ENCOUNTER — Other Ambulatory Visit: Payer: Self-pay | Admitting: Psychiatry

## 2024-07-14 ENCOUNTER — Telehealth: Payer: Self-pay | Admitting: Psychiatry

## 2024-07-14 ENCOUNTER — Other Ambulatory Visit: Payer: Self-pay

## 2024-07-14 NOTE — Telephone Encounter (Signed)
 Pt needs rf of generic vyvanse     [facility]  [address]

## 2024-07-14 NOTE — Telephone Encounter (Signed)
 Pended Vyvanse  70 mg

## 2024-07-18 ENCOUNTER — Ambulatory Visit (INDEPENDENT_AMBULATORY_CARE_PROVIDER_SITE_OTHER): Payer: BLUE CROSS/BLUE SHIELD | Admitting: Psychiatry

## 2024-07-18 ENCOUNTER — Encounter: Payer: Self-pay | Admitting: Psychiatry

## 2024-07-18 DIAGNOSIS — F331 Major depressive disorder, recurrent, moderate: Secondary | ICD-10-CM

## 2024-07-18 DIAGNOSIS — F4010 Social phobia, unspecified: Secondary | ICD-10-CM

## 2024-07-18 DIAGNOSIS — G4733 Obstructive sleep apnea (adult) (pediatric): Secondary | ICD-10-CM

## 2024-07-18 DIAGNOSIS — F422 Mixed obsessional thoughts and acts: Secondary | ICD-10-CM

## 2024-07-18 DIAGNOSIS — F900 Attention-deficit hyperactivity disorder, predominantly inattentive type: Secondary | ICD-10-CM

## 2024-07-18 MED ORDER — LISDEXAMFETAMINE DIMESYLATE 70 MG PO CAPS: 70.0000 mg | ORAL_CAPSULE | Freq: Every day | ORAL | 0 refills | Status: DC

## 2024-07-18 MED ORDER — BUPROPION HCL ER (XL) 300 MG PO TB24: 300.0000 mg | ORAL_TABLET | Freq: Every day | ORAL | 1 refills | Status: AC

## 2024-07-18 MED ORDER — LISDEXAMFETAMINE DIMESYLATE 70 MG PO CAPS
70.0000 mg | ORAL_CAPSULE | Freq: Every day | ORAL | 0 refills | Status: DC
Start: 2024-07-18 — End: 2024-07-18

## 2024-07-18 MED ORDER — VENLAFAXINE HCL ER 150 MG PO CP24: 300.0000 mg | ORAL_CAPSULE | Freq: Every day | ORAL | 1 refills | Status: AC

## 2024-07-18 NOTE — Progress Notes (Signed)
 Nicholas Bonilla [id number] [birth year] 63 y.o.   Subjective:   Patient ID:  Nicholas Bonilla is a 63 y.o. (DOB [birth year]) male.  Chief Complaint:  Chief Complaint  Patient presents with   Follow-up   Depression   ADD    Nicholas Bonilla Hug presents to the office today for follow-up of depression anxiety, ADD and sleep problems.  seen June 2020.  No med changes were made.  Continues Vyvanse  70 mg, Effexor  225 mg daily.  December 2020 appointment the following was noted: Much better.  Sepsis July and kidney stone. Hx MI.  Better now.  More back to normal.  While hospitalized was on Effexor  150 but back 225 Effexor  and it works well. Overall well except some expected bouts of loneliness and depression.  Biggest outlet has been church and Covid interfered..  Work is better and better confidence.   Patient reports stable mood and denies depressed or irritable moods.  Patient denies any recent difficulty with anxiety.  Patient denies difficulty with sleep initiation or maintenance, sleep to bed 10:30 and start waking about 8 hour.  Intermittent cathing necessary.  Not napping.. Denies appetite disturbance.  Patient reports that energy and motivation have been good.  Patient denies any difficulty with concentration.  Patient denies any suicidal ideation. Last year restarted Vyvanse  and it's working very well but it's $216 month.  Pleased with it.  Adderall less effective. Really needs the stimulant for attention and focus.  Has returned to work.  Vyvanse  added it and mood was much improved and clear-headed. No meds were changed  04/25/20 TC increase Effexor  to 300 mg daily.  05/27/2020 appointment with the following noted: Increase Effexor  helped a good deal and glad he did so. Tolerating both meds fine.  Except brief period of sleepiness in the morning for a few mins and needs to lie down.  Not always.  Is also a sense of cloudiness needing a short nap. Patient reports stable mood and denies  depressed or irritable moods.  Patient denies any recent difficulty with anxiety.  Patient denies difficulty with sleep initiation or maintenance. Denies appetite disturbance.  Patient reports motivation have been good.  Energy affected by MI and beta blockers.  Patient denies any difficulty with concentration.  Patient denies any suicidal ideation. Plan: No med changes Continue Effexor  XR 300 and Vyvanse  70  11/26/20 appt with following noted: Best friend killed earlier this year by stepson.   Things calmed down. Othel with difficulty with eyesight and pt is driving her.  Getting along well without physical intimacy but treat each other well. Lost 15 # withWW. Generally not depressed and anxiety is managed overall.  For awhile stressors interfered with work and now back to work and it's better. vyvanse  still working fine.   Tolerating meds.   Anxiety is not interfering with anything and overall good. Plan: No med changes Continue Effexor  XR 300 and Vyvanse  70  05/29/21 appt noted: Adderall alone and poor memory and focus, Vyvanse  better.   Vyvanse  70 AM with Adderall 15 mg am gives quicker effect.  Tolerated well. Got pt assistance since [employer] not covering. Hx MI 2019 and CHF.  Card ok use of stimulant. Minimal depression now.  Nicholas Bonilla involuntary eye movements and can't drive.  Burdensome. Work is OK with some procrastination on cold calls. Memory concerns; names, events, no one else said anything. Plan: Continue Effexor  XR 300 mg daily and Vyvanse  70 mg daily Trial of Namenda per his request for memory.  This can be used off label for ADHD as well  01/13/2022 appointment with the following noted: Still hit a tired spot in the afternoon with a little brain fog. May split Adderall 15 mg BID with Vyvanse  70 AM Never tried Namenda Trying supplement NeuroQ for brain fog. Wife says he's still forgetful. Alright with mood and anxiety.  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 Satisfied with meds otherwise. Plan: Continue Effexor  XR 300 and Vyvanse  70 and Adderalll 15 MG AM PM prn Trial split Effexor  150 BID to see if afternoon sleepiness is better.  07/16/2022 appointment the following noted: No kids so now gkids. Never tried splitting Effexor  BC forgot. Tiredness in evening is better. End last year until May year things dramatically better with better productivity and money. Then June Covid sx and then Elvie got it.  He was sick 4 weeks. It caused depression and anxiety.  Now struggles with motivation.   Chronic $ px worse bc missed work a month.  Some conflict with Elvie over using up too much retirement money.  Has forgotten he wasn't going to use the money.  When reminded then he remembers.   Still wants to improve focus memory.   Plan: Wellbutrin  XL 150 to 300 mg AM added to Effexor  150 BID  10/13/2022 appointment noted: Increased Wellbutrin  300 mg and initially really good with more postive outlook and feeling better about himself.  Also not as sleepy.  but now tiredness again.  Mood still a little better.  More sleepy than tired.  Can work through it  generally.  Less avoidant than he was. Due for testosterone which usually helps a good bit. Insurance covering meds well now with high ded policy. No SE Sleep is good and dep. Plan: Increase Wellbutrin  XL to 450 mg AM  01/20/2023 appointment noted: Not feeling depressed.  Initiaally saw more effect with increase Wellbutrin  , more upbeat and smilling but baseline now. Interest level is better.  Can stay more focused with work.  Still problems with memory including word-finding.  Will lose thoughts in conversation.  Wife notices his problems too. Sleep is still good.  Not much anxiety generally. Usually take Adderall 15 mg BID with Vyvanse  70 mg AM & Effexor  XR 300 mg daily with Wellbutrin  XL 450 AM. No SE unless a little brief dizziness ? Relation. Plan: Namenda for memory  per his request  06/22/23 appt noted: Looked up SE memory loss on Wellbutrin  and he reduced dose back to 300 mg daily.  Took 450 mg for about a month or less.  Memory worse on higher dose and better with lower dose.   Stopped Vyvanse  DT cost and switched back to Adderall only Never tried the memantine. Mood been really good.  Despite major $ crisis but working on it.   Overall satisfied with meds.   No changes will be made except reduction Wellbutrin . Satisfied with just Adderall for now. Normal BP with other docs  01/06/24 TC:  Patient called to report he was out of Wellbutrin  XL 300 mg. He reports he has been taking it BID for years. In the distant past he was taking 150 mg BID, but no documentation that he was to take 300 mg BID. I called pharmacy and they filled a 90-day supply on 11/17/23 and have never filled 300 mg BID. Wife has previously reported memory issues.   From 7/9 note:  Benefit with higher Wellbutrin  XL to 450 mg AM  but then reduced DT memory concerns to 300 mg daily.     MD resp:   I have never agreed to him taking Wellbutrin  XL 300 mg twice daily.  It is dangerous to take that much Wellbutrin  in combination with Vyvanse  because of the risk of seizure.  He must only take 300 mg daily 1 tablet.  I think he is mistaken about having done it for years because I do not know how I could have gotten refills.  The prescription clearly says 1 daily     01/19/24 appt noted:  in person Psych meds: Vyvanse  70 every morning,  bupropion  XL 300 mg every morning, venlafaxine  XR 150 mg capsules 2 daily.  If out of Vyvanse  takes Adderall DT cost.   Last received Adderall 30 in August but then switched to Vyvanse  again. DX ischemic cardiomyopathy with EF 40%.  No concerns from them about Vyvanse .  Vyvanse  is still expensive.  Wants to switch to Adderall for awhile DT cost. Disc his accidental taking Wellbutrin  600 but didn't have SE. Dep is manageable.  Some down with holidatys over family but  resolved.   Seeing card on 02/08/24.  Card hosp 12/13/23 with no MI Plan:   07/18/24 appt noted:  Med: Vyvanse  70 AM, bupropion  XL 300 mg every morning, venlafaxine  XR 150 mg capsules 2 daily.  Generic Vyvanse  more affordable, clearer mind.  Clearer conversations. No SE. No problems with any med.  Mood has been good.  Sleep good.   Hosp with heart in Dec with CHF.  Better with heart function.  BP good.     Past Psychiatric Medication Trials:  Adderall, modafinil, Adzenys , Daytrana,  Vyvanse  Memantine Venlafaxine  300,   Prozac, citalopram,  Wellbutrin  450   Review of Systems:  Review of Systems  Constitutional:  Positive for fatigue. Negative for diaphoresis.  Respiratory:  Negative for shortness of breath.   Cardiovascular:  Negative for chest pain and palpitations.  Musculoskeletal:  Positive for arthralgias and back pain. Negative for neck stiffness.  Neurological:  Negative for tremors.  Psychiatric/Behavioral:  Negative for agitation, behavioral problems, confusion, decreased concentration, dysphoric mood, hallucinations, self-injury, sleep disturbance and suicidal ideas. The patient is not nervous/anxious and is not hyperactive.   BP controlled  Medications: I have reviewed the patient's current medications.  Current Outpatient Medications  Medication Sig Dispense Refill   acetaminophen  (TYLENOL ) 500 MG tablet Take 1,000 mg by mouth 3 (three) times daily.     apixaban  (ELIQUIS ) 5 MG TABS tablet Take 1 tablet (5 mg total) by mouth 2 (two) times daily. 60 tablet 5   aspirin  81 MG chewable tablet Chew 81 mg by mouth daily.     atorvastatin  (LIPITOR) 80 MG tablet Take 80 mg by mouth daily.     balsalazide (COLAZAL ) 750 MG capsule Take 2,250 mg by mouth 3 (three) times daily.      Cholecalciferol (VITAMIN D3) 5000 units CAPS Take 5,000 Units by mouth daily.     Coenzyme Q10 (CO Q-10) 100 MG CAPS Take 100 mg by mouth daily.     DHEA 25 MG CAPS Take by mouth.     ENTRESTO 24-26 MG       lisdexamfetamine (VYVANSE ) 70 MG capsule Take 1 capsule (70 mg total) by mouth daily. 30 capsule 0   lisinopril (PRINIVIL,ZESTRIL) 2.5 MG tablet Take 5 mg by mouth daily.     metoprolol  succinate (TOPROL -XL) 100 MG 24 hr tablet Take 100 mg by mouth daily. Take with or  immediately following a meal.     Multiple Vitamins-Minerals (THERAGRAN-M PREMIER 50 PLUS) TABS Take 1 tablet by mouth daily.     polyethylene glycol (MIRALAX  / GLYCOLAX ) packet Take 17 g by mouth 2 (two) times daily.     Specialty Vitamins Products (PROSTATE PO) Take by mouth.     tamsulosin  (FLOMAX ) 0.4 MG CAPS capsule Take 1 capsule (0.4 mg total) by mouth daily. 30 capsule 0   testosterone cypionate (DEPOTESTOSTERONE CYPIONATE) 200 MG/ML injection 0.4ml every 11 days     amphetamine -dextroamphetamine  (ADDERALL) 30 MG tablet Take 0.5 tablets by mouth 2 (two) times daily. (Patient not taking: Reported on 07/18/2024) 30 tablet 0   amphetamine -dextroamphetamine  (ADDERALL) 30 MG tablet TAKE 1/2 TABLET BY MOUTH IN THE MORNING AND 3 PM (Patient not taking: Reported on 07/18/2024) 30 tablet 0   amphetamine -dextroamphetamine  (ADDERALL) 30 MG tablet Take 0.5 tablets by mouth 2 (two) times daily. (Patient not taking: Reported on 07/18/2024) 30 tablet 0   amphetamine -dextroamphetamine  (ADDERALL) 30 MG tablet TAKE 1/2 TABLET BY MOUTH 2 TIMES DAILY (Patient not taking: Reported on 07/18/2024) 30 tablet 0   amphetamine -dextroamphetamine  (ADDERALL) 30 MG tablet TAKE 1/2 TABLET BY MOUTH 2 TIMES DAILY (Patient not taking: Reported on 07/18/2024) 30 tablet 0   amphetamine -dextroamphetamine  (ADDERALL) 30 MG tablet TAKE 1/2 TABLET BY MOUTH 2 TIMES DAILY (Patient not taking: Reported on 07/18/2024) 30 tablet 0   buPROPion  (WELLBUTRIN  XL) 300 MG 24 hr tablet Take 1 tablet (300 mg total) by mouth daily. 90 tablet 1   eplerenone (INSPRA) 25 MG tablet Take 25 mg by mouth daily.     [START ON 08/15/2024] lisdexamfetamine (VYVANSE ) 70 MG capsule Take 1 capsule (70 mg  total) by mouth daily. 30 capsule 0   [START ON 09/12/2024] lisdexamfetamine (VYVANSE ) 70 MG capsule Take 1 capsule (70 mg total) by mouth daily. 30 capsule 0   venlafaxine  XR (EFFEXOR -XR) 150 MG 24 hr capsule Take 2 capsules (300 mg total) by mouth daily with breakfast. 180 capsule 1   No current facility-administered medications for this visit.    Medication Side Effects: None  Allergies:  Allergies  Allergen Reactions   Codeine Rash and Other (See Comments)    Only in high doses Other reaction(s): Headache    Latex Rash   Naproxen Sodium Other (See Comments)    REACTION: colitis flare   Promethazine Hcl Other (See Comments)    REACTION: mental status changes, memory loss    Past Medical History:  Diagnosis Date   ADD (attention deficit disorder with hyperactivity)    Arthritis    History of blood clots    Left Leg   History of kidney stones    Hypertension    OCD (obsessive compulsive disorder)    Ulcerative colitis     Family History  Problem Relation Age of Onset   Heart attack Father 63   Hypertension Father    Rheumatic fever Father    Sudden death Father    Cancer Sister        Larynx-smoker   Hypertension Sister    Heart attack Maternal Uncle        X2; > 72 yo   Heart attack Paternal Grandfather        > 81   Dementia Mother    Diabetes Neg Hx    Hyperlipidemia Neg Hx     Social History   Socioeconomic History   Marital status: Married    Spouse name: Elvie   Number of children:  0   Years of education: 12+   Highest education level: Some college, no degree  Occupational History   Not on file  Tobacco Use   Smoking status: Former    Current packs/day: 0.00    Types: Cigarettes    Quit date: 12/14/1984    Years since quitting: 39.6   Smokeless tobacco: Never  Vaping Use   Vaping status: Never Used  Substance and Sexual Activity   Alcohol use: Yes    Comment:  1 wine or  beer/ day or less   Drug use: No   Sexual activity: Not on file   Other Topics Concern   Not on file  Social History Narrative   From [facility]   Married with no children   Occasional drinks   Former smoker quit 1986   Full Code   Social Drivers of Corporate investment banker Strain: Medium Risk (12/17/2023)   Received from [facility]   Overall Financial Resource Strain (CARDIA)    Difficulty of Paying Living Expenses: Somewhat hard  Food Insecurity: No Food Insecurity (12/17/2023)   Received from [facility]   Hunger Vital Sign    Within the past 12 months, you worried that your food would run out before you got the money to buy more.: Never true    Within the past 12 months, the food you bought just didn't last and you didn't have money to get more.: Never true  Transportation Needs: No Transportation Needs (12/17/2023)   Received from [facility]   PRAPARE - Transportation    Lack of Transportation (Medical): No    Lack of Transportation (Non-Medical): No  Physical Activity: Not on file  Stress: Not on file  Social Connections: Not on file  Intimate Partner Violence: Not At Risk (05/03/2024)   Received from [facility]   Humiliation, Afraid, Rape, and Kick questionnaire    Within the last year, have you been afraid of your partner or ex-partner?: No    Within the last year, have you been humiliated or emotionally abused in other ways by your partner or ex-partner?: No    Within the last year, have you been kicked, hit, slapped, or otherwise physically hurt by your partner or ex-partner?: No    Within the last year, have you been raped or forced to have any kind of sexual activity by your partner or ex-partner?: No    Past Medical History, Surgical history, Social history, and Family history were reviewed and updated as appropriate.   Please see review of systems for further details on the patient's review from today.   Objective:   Physical Exam:  There were no vitals taken for this visit.  Physical  Exam Constitutional:      General: He is not in acute distress.    Appearance: He is well-developed.  Musculoskeletal:        General: No deformity.  Neurological:     Mental Status: He is alert and oriented to person, place, and time.     Motor: No tremor.     Coordination: Coordination normal.     Gait: Gait normal.  Psychiatric:        Attention and Perception: Attention and perception normal. He is attentive.        Mood and Affect: Mood is not anxious or depressed. Affect is not labile, blunt, angry or inappropriate.        Speech: Speech normal.  Behavior: Behavior normal.        Thought Content: Thought content normal. Thought content is not delusional. Thought content does not include homicidal or suicidal ideation. Thought content does not include suicidal plan.        Cognition and Memory: Cognition normal. He exhibits impaired recent memory.        Judgment: Judgment normal.     Comments: Insight intact. No auditory or visual hallucinations. No delusions.  Good mood.       Lab Review:     Component Value Date/Time   NA 140 07/10/2019 0830   NA 143 08/07/2014 1450   K 4.4 07/10/2019 0830   K 4.0 08/07/2014 1450   CL 103 07/10/2019 0830   CL 107 08/07/2014 1450   CO2 24 07/10/2019 0830   CO2 27 08/07/2014 1450   GLUCOSE 95 07/10/2019 0830   GLUCOSE 102 (H) 08/07/2014 1450   BUN 23 (H) 07/10/2019 0830   BUN 21 (H) 08/07/2014 1450   CREATININE 1.08 07/10/2019 0830   CREATININE 1.20 08/07/2014 1450   CALCIUM  9.5 07/10/2019 0830   CALCIUM  9.0 08/07/2014 1450   PROT 7.1 07/10/2019 0830   PROT 7.0 08/07/2014 1450   ALBUMIN 4.1 07/10/2019 0830   ALBUMIN 3.8 08/07/2014 1450   AST 34 07/10/2019 0830   AST 29 08/07/2014 1450   ALT 30 07/10/2019 0830   ALT 33 08/07/2014 1450   ALKPHOS 79 07/10/2019 0830   ALKPHOS 58 08/07/2014 1450   BILITOT 0.8 07/10/2019 0830   BILITOT 0.4 08/07/2014 1450   GFRNONAA >60 07/10/2019 0830   GFRNONAA >60 08/07/2014 1450    GFRAA >60 07/10/2019 0830   GFRAA >60 08/07/2014 1450       Component Value Date/Time   WBC 5.8 07/10/2019 0830   RBC 4.84 07/10/2019 0830   HGB 15.5 07/10/2019 0830   HGB 16.6 08/07/2014 1450   HCT 45.3 07/10/2019 0830   HCT 48.3 08/07/2014 1450   PLT 268 07/10/2019 0830   PLT 178 08/07/2014 1450   MCV 93.6 07/10/2019 0830   MCV 91 08/07/2014 1450   MCH 32.0 07/10/2019 0830   MCHC 34.2 07/10/2019 0830   RDW 12.3 07/10/2019 0830   RDW 12.5 08/07/2014 1450   LYMPHSABS 1.7 07/10/2019 0830   LYMPHSABS 1.8 08/07/2014 1450   MONOABS 0.8 07/10/2019 0830   MONOABS 0.5 08/07/2014 1450   EOSABS 0.0 07/10/2019 0830   EOSABS 0.1 08/07/2014 1450   BASOSABS 0.1 07/10/2019 0830   BASOSABS 0.1 08/07/2014 1450    No results found for: POCLITH, LITHIUM   No results found for: PHENYTOIN, PHENOBARB, VALPROATE, CBMZ  Obstructive sleep apnea results discussed in detail he has very mild sleep apnea with an AHI of 8.5.  He could consider CPAP but is not likely to make a huge difference.  We will reserve this option for later. .res Assessment: Plan:    Shrihan was seen today for follow-up, depression and add.  Diagnoses and all orders for this visit:  Major depressive disorder, recurrent episode, moderate (HCC) -     venlafaxine  XR (EFFEXOR -XR) 150 MG 24 hr capsule; Take 2 capsules (300 mg total) by mouth daily with breakfast. -     buPROPion  (WELLBUTRIN  XL) 300 MG 24 hr tablet; Take 1 tablet (300 mg total) by mouth daily.  Social anxiety disorder -     venlafaxine  XR (EFFEXOR -XR) 150 MG 24 hr capsule; Take 2 capsules (300 mg total) by mouth daily with breakfast.  Attention deficit  hyperactivity disorder (ADHD), predominantly inattentive type -     lisdexamfetamine (VYVANSE ) 70 MG capsule; Take 1 capsule (70 mg total) by mouth daily. -     lisdexamfetamine (VYVANSE ) 70 MG capsule; Take 1 capsule (70 mg total) by mouth daily. -     lisdexamfetamine (VYVANSE ) 70 MG capsule; Take 1  capsule (70 mg total) by mouth daily. -     buPROPion  (WELLBUTRIN  XL) 300 MG 24 hr tablet; Take 1 tablet (300 mg total) by mouth daily.  Mixed obsessional thoughts and acts -     venlafaxine  XR (EFFEXOR -XR) 150 MG 24 hr capsule; Take 2 capsules (300 mg total) by mouth daily with breakfast.  Mild obstructive sleep apnea   30 min face to face time with patient was spent on counseling and coordination of care. We discussed [name] has had controlled depression and poor attention and focus. But also memory concerns.  With chronic fatigue .  Is also working on adjusting testosterone to help.    Reports the Vyvanse  had a much better mood and conc effect with the Vyvanse  even over the modafanil.Has tried Adzenys  and Adderall also.    Disc cardiac effects.   Both his mood and focus are better on the Vyvanse  but can no longer afford and stopped it  Continue Effexor  XR 300 and Adderalll 15 MG AM PM prn split Effexor  150 BID to see if afternoon sleepiness is better. Disc interactions with other supplements meds that might increase NE then he may have trouble tolerating.  No med changes: Continue Vyvanse  70 mg ,   Continue Wellbutrin  XL 300 ,  venlafaxine  XR 300  He agrees to this plan Disc support group.  For addictive tendencies.   FU 4-6 mos.    Lorene Macintosh, MD, DFAPA   Please see After Visit Summary for patient specific instructions.  No future appointments.    No orders of the defined types were placed in this encounter.      -------------------------------

## 2024-11-20 ENCOUNTER — Other Ambulatory Visit: Payer: Self-pay | Admitting: Psychiatry

## 2024-11-20 DIAGNOSIS — F900 Attention-deficit hyperactivity disorder, predominantly inattentive type: Secondary | ICD-10-CM

## 2024-11-21 MED ORDER — LISDEXAMFETAMINE DIMESYLATE 70 MG PO CAPS: 70.0000 mg | ORAL_CAPSULE | Freq: Every day | ORAL | 0 refills | Status: AC

## 2025-01-22 ENCOUNTER — Ambulatory Visit: Payer: Self-pay | Admitting: Psychiatry
